# Patient Record
Sex: Female | Born: 1955
Health system: Southern US, Community
[De-identification: ages and names within clinical notes are randomized; demographics above are authoritative.]

## PROBLEM LIST (undated history)

## (undated) DIAGNOSIS — Z8619 Personal history of other infectious and parasitic diseases: Secondary | ICD-10-CM

## (undated) DIAGNOSIS — E785 Hyperlipidemia, unspecified: Secondary | ICD-10-CM

## (undated) DIAGNOSIS — Z860100 Personal history of colon polyps, unspecified: Secondary | ICD-10-CM

## (undated) DIAGNOSIS — K5790 Diverticulosis of intestine, part unspecified, without perforation or abscess without bleeding: Secondary | ICD-10-CM

## (undated) DIAGNOSIS — R011 Cardiac murmur, unspecified: Secondary | ICD-10-CM

## (undated) DIAGNOSIS — E559 Vitamin D deficiency, unspecified: Secondary | ICD-10-CM

## (undated) DIAGNOSIS — I2510 Atherosclerotic heart disease of native coronary artery without angina pectoris: Secondary | ICD-10-CM

## (undated) DIAGNOSIS — C439 Malignant melanoma of skin, unspecified: Secondary | ICD-10-CM

## (undated) DIAGNOSIS — F419 Anxiety disorder, unspecified: Secondary | ICD-10-CM

## (undated) HISTORY — DX: Personal history of colonic polyps: Z86.010

## (undated) HISTORY — DX: Malignant melanoma of skin, unspecified: C43.9

## (undated) HISTORY — DX: Vitamin D deficiency, unspecified: E55.9

## (undated) HISTORY — DX: Diverticulosis of intestine, part unspecified, without perforation or abscess without bleeding: K57.90

## (undated) HISTORY — DX: Hyperlipidemia, unspecified: E78.5

## (undated) HISTORY — DX: Cardiac murmur, unspecified: R01.1

## (undated) HISTORY — DX: Personal history of other infectious and parasitic diseases: Z86.19

## (undated) HISTORY — DX: Anxiety disorder, unspecified: F41.9

## (undated) HISTORY — DX: Personal history of colon polyps, unspecified: Z86.0100

---

## 1985-04-09 HISTORY — PX: MANDIBLE SURGERY: SHX707

## 1986-04-09 DIAGNOSIS — C439 Malignant melanoma of skin, unspecified: Secondary | ICD-10-CM

## 1986-04-09 HISTORY — PX: SKIN SURGERY: SHX2413

## 1986-04-09 HISTORY — DX: Malignant melanoma of skin, unspecified: C43.9

## 1996-04-09 HISTORY — PX: TUBAL LIGATION: SHX77

## 1997-04-09 HISTORY — PX: DILATION AND CURETTAGE OF UTERUS: SHX78

## 1997-12-29 ENCOUNTER — Ambulatory Visit (HOSPITAL_COMMUNITY): Admission: AD | Admit: 1997-12-29 | Discharge: 1997-12-29 | Payer: Self-pay | Admitting: Family Medicine

## 1998-06-20 ENCOUNTER — Ambulatory Visit (HOSPITAL_COMMUNITY): Admission: RE | Admit: 1998-06-20 | Discharge: 1998-06-20 | Payer: Self-pay | Admitting: Family Medicine

## 1998-06-20 ENCOUNTER — Encounter: Payer: Self-pay | Admitting: Family Medicine

## 1998-06-27 ENCOUNTER — Ambulatory Visit (HOSPITAL_COMMUNITY): Admission: RE | Admit: 1998-06-27 | Discharge: 1998-06-27 | Payer: Self-pay | Admitting: Family Medicine

## 1998-06-27 ENCOUNTER — Encounter: Payer: Self-pay | Admitting: Family Medicine

## 1998-12-22 ENCOUNTER — Encounter (INDEPENDENT_AMBULATORY_CARE_PROVIDER_SITE_OTHER): Payer: Self-pay

## 1998-12-22 ENCOUNTER — Ambulatory Visit (HOSPITAL_COMMUNITY): Admission: RE | Admit: 1998-12-22 | Discharge: 1998-12-22 | Payer: Self-pay | Admitting: *Deleted

## 2000-11-22 ENCOUNTER — Encounter (INDEPENDENT_AMBULATORY_CARE_PROVIDER_SITE_OTHER): Payer: Self-pay | Admitting: Specialist

## 2000-11-22 ENCOUNTER — Other Ambulatory Visit: Admission: RE | Admit: 2000-11-22 | Discharge: 2000-11-22 | Payer: Self-pay | Admitting: *Deleted

## 2002-02-10 ENCOUNTER — Other Ambulatory Visit: Admission: RE | Admit: 2002-02-10 | Discharge: 2002-02-10 | Payer: Self-pay | Admitting: *Deleted

## 2003-07-05 ENCOUNTER — Other Ambulatory Visit: Admission: RE | Admit: 2003-07-05 | Discharge: 2003-07-05 | Payer: Self-pay | Admitting: *Deleted

## 2004-07-12 ENCOUNTER — Other Ambulatory Visit: Admission: RE | Admit: 2004-07-12 | Discharge: 2004-07-12 | Payer: Self-pay | Admitting: *Deleted

## 2005-05-09 ENCOUNTER — Encounter: Admission: RE | Admit: 2005-05-09 | Discharge: 2005-05-09 | Payer: Self-pay | Admitting: Family Medicine

## 2005-07-24 ENCOUNTER — Other Ambulatory Visit: Admission: RE | Admit: 2005-07-24 | Discharge: 2005-07-24 | Payer: Self-pay | Admitting: *Deleted

## 2006-04-09 HISTORY — PX: COLONOSCOPY: SHX174

## 2006-07-25 ENCOUNTER — Other Ambulatory Visit: Admission: RE | Admit: 2006-07-25 | Discharge: 2006-07-25 | Payer: Self-pay | Admitting: *Deleted

## 2006-09-25 ENCOUNTER — Other Ambulatory Visit: Admission: RE | Admit: 2006-09-25 | Discharge: 2006-09-25 | Payer: Self-pay | Admitting: *Deleted

## 2011-08-09 LAB — COMPREHENSIVE METABOLIC PANEL
AST: 17 U/L
Alkaline Phosphatase: 64 U/L
BUN: 15 mg/dL (ref 4–21)
Creat: 0.72
Potassium: 4.5 mmol/L

## 2011-11-21 ENCOUNTER — Ambulatory Visit (INDEPENDENT_AMBULATORY_CARE_PROVIDER_SITE_OTHER): Payer: 59 | Admitting: Family Medicine

## 2011-11-21 ENCOUNTER — Encounter: Payer: Self-pay | Admitting: Family Medicine

## 2011-11-21 VITALS — BP 128/78 | HR 64 | Temp 97.9°F | Ht 65.0 in | Wt 157.8 lb

## 2011-11-21 DIAGNOSIS — M25522 Pain in left elbow: Secondary | ICD-10-CM | POA: Insufficient documentation

## 2011-11-21 DIAGNOSIS — M25562 Pain in left knee: Secondary | ICD-10-CM | POA: Insufficient documentation

## 2011-11-21 DIAGNOSIS — M25529 Pain in unspecified elbow: Secondary | ICD-10-CM

## 2011-11-21 DIAGNOSIS — G4700 Insomnia, unspecified: Secondary | ICD-10-CM

## 2011-11-21 DIAGNOSIS — M25569 Pain in unspecified knee: Secondary | ICD-10-CM

## 2011-11-21 DIAGNOSIS — F5104 Psychophysiologic insomnia: Secondary | ICD-10-CM | POA: Insufficient documentation

## 2011-11-21 NOTE — Assessment & Plan Note (Signed)
Discussed sleep hygiene. Try measures discussed. If not improved ,consider silenor. myself and pt hesitant to use non benzo hypnotic long term.

## 2011-11-21 NOTE — Assessment & Plan Note (Signed)
Anticipate medial collateral ligament strain - treat with OTC NSAIDs, ice, rest, knee brace.   Provided with SM pt advisor handout on medial ligament knee strain. If not better, consider further eval.

## 2011-11-21 NOTE — Patient Instructions (Addendum)
Good to see you today.  Return as needed or in 6 mo to 1 year for physical/rpt blood work. I think you have medial ligament strain of left knee - try stretching exercises and flexible knee brace, let me know if not improving. Keep eye on elbow. We may try silenor in future for sleep.  Insomnia Insomnia is frequent trouble falling and/or staying asleep. Insomnia can be a long term problem or a short term problem. Both are common. Insomnia can be a short term problem when the wakefulness is related to a certain stress or worry. Long term insomnia is often related to ongoing stress during waking hours and/or poor sleeping habits. Overtime, sleep deprivation itself can make the problem worse. Every little thing feels more severe because you are overtired and your ability to cope is decreased. CAUSES   Stress, anxiety, and depression.   Poor sleeping habits.   Distractions such as TV in the bedroom.   Naps close to bedtime.   Engaging in emotionally charged conversations before bed.   Technical reading before sleep.   Alcohol and other sedatives. They may make the problem worse. They can hurt normal sleep patterns and normal dream activity.   Stimulants such as caffeine for several hours prior to bedtime.   Pain syndromes and shortness of breath can cause insomnia.   Exercise late at night.   Changing time zones may cause sleeping problems (jet lag).  It is sometimes helpful to have someone observe your sleeping patterns. They should look for periods of not breathing during the night (sleep apnea). They should also look to see how long those periods last. If you live alone or observers are uncertain, you can also be observed at a sleep clinic where your sleep patterns will be professionally monitored. Sleep apnea requires a checkup and treatment. Give your caregivers your medical history. Give your caregivers observations your family has made about your sleep.  SYMPTOMS   Not feeling  rested in the morning.   Anxiety and restlessness at bedtime.   Difficulty falling and staying asleep.  TREATMENT   Your caregiver may prescribe treatment for an underlying medical disorders. Your caregiver can give advice or help if you are using alcohol or other drugs for self-medication. Treatment of underlying problems will usually eliminate insomnia problems.   Medications can be prescribed for short time use. They are generally not recommended for lengthy use.   Over-the-counter sleep medicines are not recommended for lengthy use. They can be habit forming.   You can promote easier sleeping by making lifestyle changes such as:   Using relaxation techniques that help with breathing and reduce muscle tension.   Exercising earlier in the day.   Changing your diet and the time of your last meal. No night time snacks.   Establish a regular time to go to bed.   Counseling can help with stressful problems and worry.   Soothing music and white noise may be helpful if there are background noises you cannot remove.   Stop tedious detailed work at least one hour before bedtime.  HOME CARE INSTRUCTIONS   Keep a diary. Inform your caregiver about your progress. This includes any medication side effects. See your caregiver regularly. Take note of:   Times when you are asleep.   Times when you are awake during the night.   The quality of your sleep.   How you feel the next day.  This information will help your caregiver care for you.  Get  out of bed if you are still awake after 15 minutes. Read or do some quiet activity. Keep the lights down. Wait until you feel sleepy and go back to bed.   Keep regular sleeping and waking hours. Avoid naps.   Exercise regularly.   Avoid distractions at bedtime. Distractions include watching television or engaging in any intense or detailed activity like attempting to balance the household checkbook.   Develop a bedtime ritual. Keep a familiar  routine of bathing, brushing your teeth, climbing into bed at the same time each night, listening to soothing music. Routines increase the success of falling to sleep faster.   Use relaxation techniques. This can be using breathing and muscle tension release routines. It can also include visualizing peaceful scenes. You can also help control troubling or intruding thoughts by keeping your mind occupied with boring or repetitive thoughts like the old concept of counting sheep. You can make it more creative like imagining planting one beautiful flower after another in your backyard garden.   During your day, work to eliminate stress. When this is not possible use some of the previous suggestions to help reduce the anxiety that accompanies stressful situations.  MAKE SURE YOU:   Understand these instructions.   Will watch your condition.   Will get help right away if you are not doing well or get worse.  Document Released: 03/23/2000 Document Revised: 03/15/2011 Document Reviewed: 04/23/2007 [facility] Patient Information 2012 [place], [place].

## 2011-11-21 NOTE — Assessment & Plan Note (Signed)
Benign exam.  Monitor for now, may have already resolved.

## 2011-11-21 NOTE — Progress Notes (Signed)
Subjective:    Patient ID: [name], female    DOB: 10-18-1955, 56 y.o.   MRN: [id number]  HPI CC: new pt to establish  Pt brings records from prior PCP, Dr. Nita Sells.  L knee pain - started after memorial day this year.  Anterior medial inferior knee pain, sharp stabbing.  Worse pain with squatting down or with stepping out of car.  No pain with stairs or hills. L elbow pain - since April.  Worse pain with lifting something heavy.  Posterior distal elbow pain.  No inciting injury, trauma.  No new exercise routine to start this.  Tries to walk a few miles 2-3x/wk which seems to help pain.  Ibuprofen helps.  Insomnia - sleep maintenance.  Has tried 1/4 pill xanax 0.5, melatonin, 5HTP, ambien (which did help but pt worried about long term use.  No trouble falling asleep - trouble staying asleep, will sleep for 2 hours then stays awake for several hours at a time.  LMP 09/2011, less regular since beginning of 2013.  Preventative: Last CPE 10/2010 (Dr. Nita Sells).   Well woman with OBGYN - March 2013. Tetanus - unsure colonoscopy - 2007, normal, rec rpt 10 yrs (Dr. Loreta Ave)  Caffeine: 2 cups coffee/day Lives with husband, 2 dogs. 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  Past histories reviewed and updated if relevant as below. There is no problem list on file for this patient.  Past Medical History  Diagnosis Date  . Malignant melanoma 1988    L thigh, stage 3, yearly skin checks with Dr .[place] at [facility]  . History of chicken pox   . Heart murmur     faint, ?MVP   Past Surgical History  Procedure Date  . Mandible surgery 1987  . Cesarean section 1982 and 1989  . Tubal ligation 1998  . Skin surgery 1988    melanoma excision   History  Substance Use Topics  . Smoking status: Former Smoker    Quit date: 04/09/1992  . Smokeless tobacco: Never  Used   Comment: quit 20 years ago  . Alcohol Use: Yes     4 glasses of wine a week   Family History  Problem Relation Age of Onset  . Coronary artery disease Father 89  . Other Mother 85    pulm aneurysm  . Stroke Maternal Grandmother   . Cancer Neg Hx   . Diabetes Neg Hx   . Hypertension Sister   . Coronary artery disease Brother 1    s/p CABG   No Known Allergies No current outpatient prescriptions on file prior to visit.     Review of Systems  Constitutional: Negative for fever, chills, activity change, appetite change, fatigue and unexpected weight change.  HENT: Negative for hearing loss and neck pain.   Eyes: Negative for visual disturbance.  Respiratory: Positive for shortness of breath (just with exercise). Negative for cough, chest tightness and wheezing.   Cardiovascular: Negative for chest pain, palpitations and leg swelling.  Gastrointestinal: Negative for nausea, vomiting, abdominal pain, diarrhea, constipation, blood in stool and abdominal distention.  Genitourinary: Negative for hematuria and difficulty urinating.  Musculoskeletal: Negative for myalgias and arthralgias.  Skin: Negative for rash.  Neurological: Negative for dizziness, seizures, syncope and headaches.  Hematological: Does not bruise/bleed easily.  Psychiatric/Behavioral: Negative for dysphoric mood. The patient is nervous/anxious.        Objective:  Physical Exam  Nursing note and vitals reviewed. Constitutional: She is oriented to person, place, and time. She appears well-developed and well-nourished. No distress.  HENT:  Head: Normocephalic and atraumatic.  Right Ear: External ear normal.  Left Ear: External ear normal.  Nose: Nose normal.  Mouth/Throat: Oropharynx is clear and moist. No oropharyngeal exudate.  Eyes: Conjunctivae and EOM are normal. Pupils are equal, round, and reactive to light. No scleral icterus.  Neck: Normal range of motion. Neck supple. No thyromegaly present.    Cardiovascular: Normal rate, regular rhythm, normal heart sounds and intact distal pulses.   No murmur heard. Pulses:      Radial pulses are 2+ on the right side, and 2+ on the left side.  Pulmonary/Chest: Effort normal and breath sounds normal. No respiratory distress. She has no wheezes. She has no rales.  Abdominal: Soft. Bowel sounds are normal. She exhibits no distension and no mass. There is no tenderness. There is no rebound and no guarding.  Musculoskeletal: Normal range of motion. She exhibits no edema.       R elbow WNL L elbow - FROM flexion and extension, no pain with palpation of joint - no pain at med/lat epicondyle, olecranon. R knee WNL L knee -  No deformity, no tenderness to palpation.  FROM.  Mild crepitus.  Neg drawer test, neg McMurray's test, no PF grind, no abnl patellar mobility.  + tender with testing with valgus force.  Lymphadenopathy:    She has no cervical adenopathy.  Neurological: She is alert and oriented to person, place, and time.       CN grossly intact, station and gait intact  Skin: Skin is warm and dry. No rash noted.  Psychiatric: She has a normal mood and affect. Her behavior is normal. Judgment and thought content normal.       Assessment & Plan:

## 2011-11-21 NOTE — Addendum Note (Signed)
Addended by: Eustaquio Boyden on: 11/21/2011 11:33 PM   Modules accepted: Level of Service

## 2011-11-25 ENCOUNTER — Encounter: Payer: Self-pay | Admitting: Family Medicine

## 2011-12-12 ENCOUNTER — Other Ambulatory Visit: Payer: Self-pay | Admitting: Dermatology

## 2011-12-17 ENCOUNTER — Encounter: Payer: Self-pay | Admitting: Family Medicine

## 2012-05-13 ENCOUNTER — Encounter: Payer: Self-pay | Admitting: Obstetrics and Gynecology

## 2012-05-14 ENCOUNTER — Encounter: Payer: Self-pay | Admitting: Obstetrics and Gynecology

## 2012-06-27 ENCOUNTER — Ambulatory Visit (INDEPENDENT_AMBULATORY_CARE_PROVIDER_SITE_OTHER): Payer: 59 | Admitting: Obstetrics and Gynecology

## 2012-06-27 ENCOUNTER — Encounter: Payer: Self-pay | Admitting: Obstetrics and Gynecology

## 2012-06-27 VITALS — BP 124/70 | Ht 64.75 in | Wt 148.0 lb

## 2012-06-27 DIAGNOSIS — Z0000 Encounter for general adult medical examination without abnormal findings: Secondary | ICD-10-CM

## 2012-06-27 LAB — POCT URINALYSIS DIPSTICK
Bilirubin, UA: NEGATIVE
Glucose, UA: NEGATIVE
Ketones, UA: NEGATIVE
Leukocytes, UA: NEGATIVE
Protein, UA: NEGATIVE

## 2012-06-27 LAB — HEMOGLOBIN, FINGERSTICK: Hemoglobin, fingerstick: 14.7 g/dL (ref 12.0–16.0)

## 2012-06-27 MED ORDER — ESTRADIOL ACETATE 0.1 MG/24HR VA RING: 1.0000 | VAGINAL_RING | VAGINAL | Status: DC

## 2012-06-27 MED ORDER — PROGESTERONE MICRONIZED 100 MG PO CAPS: 100.0000 mg | ORAL_CAPSULE | Freq: Every day | ORAL | Status: DC

## 2012-06-27 NOTE — Addendum Note (Signed)
Addended by: Alison Murray on: 06/27/2012 12:19 PM   Modules accepted: Orders

## 2012-06-27 NOTE — Patient Instructions (Signed)

## 2012-06-27 NOTE — Progress Notes (Addendum)
57 y.o. MarriedCaucasian female   [name] here for annual exam. The hormones are "a miracle"  Sleeping better, no hot flashes, likes HRT very much.  Wants to continue.  No bleeding on HRT.   Patient's last menstrual period was 12/15/2011.          Sexually active: yes  The current method of family planning is none.    Exercising: yes  Home exercise routine includes stretching. Last mammogram:  12/04/2011 normal Last pap smear: 05/09/2009 neg History of abnormal pap:  Smoking:no Alcohol: occ wine Last colonoscopy:2009 (1) polyp benign Last Bone Density:  none  Hgb:      14.7          Urine:neg    Health Maintenance  Topic Date Due  . Influenza Vaccine  12/08/1956  . Pap Smear  11/06/1974  . Tetanus/tdap  11/06/1975  . Mammogram  11/06/2006  . Colonoscopy  09/27/2016    Family History  Problem Relation Age of Onset  . Coronary artery disease Father 14  . Other Mother 39    pulm aneurysm  . Stroke Maternal Grandmother   . Cancer Neg Hx   . Diabetes Neg Hx   . Hypertension Sister   . Coronary artery disease Brother 37    s/p CABG    Patient Active Problem List  Diagnosis  . Elbow pain, left  . Knee pain, left  . Insomnia    Past Medical History  Diagnosis Date  . Malignant melanoma 1988    L thigh, stage 3, yearly skin checks with Dr .[place] at [facility]  . History of chicken pox   . Heart murmur     PVCs thought from MVP  . Anxiety   . History of colon polyps   . Diverticulosis     by colonosocpy  . Vitamin D deficiency   . HLD (hyperlipidemia)     mild    Past Surgical History  Procedure Laterality Date  . Mandible surgery  1987  . Cesarean section  1982 and 1989  . Tubal ligation  1998  . Skin surgery  1988    melanoma excision  . Colonoscopy  2008    small hyperplastic polyps, small int hem, early diverticula (Mann) rpt 10 yrs  . Dilation and curettage of uterus  1999    Allergies: Review of patient's allergies indicates no known  allergies.  Current Outpatient Prescriptions  Medication Sig Dispense Refill  . Ascorbic Acid (VITAMIN C) 1000 MG tablet Take 1,000 mg by mouth daily.      . ergocalciferol (VITAMIN D2) 50000 UNITS capsule Take 50,000 Units by mouth every 14 (fourteen) days.       . Multiple Vitamin (MULTIVITAMIN) tablet Take 1 tablet by mouth daily.      Marland Kitchen ALPRAZolam (XANAX) 0.25 MG tablet Take 0.25 mg by mouth at bedtime as needed.       No current facility-administered medications for this visit.    ROS: Pertinent items are noted in HPI.  Exam:    BP 124/70  Ht 5' 4.75" (1.645 m)  Wt 148 lb (67.132 kg)  BMI 24.81 kg/m2  LMP 12/15/2011   Wt Readings from Last 3 Encounters:  06/27/12 148 lb (67.132 kg)  11/21/11 157 lb 12 oz (71.555 kg)     Ht Readings from Last 3 Encounters:  06/27/12 5' 4.75" (1.645 m)  11/21/11 5\' 5"  (1.651 m)    General appearance: alert, cooperative and appears stated age Head: Normocephalic, without obvious  abnormality, atraumatic Neck: no adenopathy, supple, symmetrical, trachea midline and thyroid not enlarged, symmetric, no tenderness/mass/nodules Lungs: clear to auscultation bilaterally Breasts: Inspection negative, No nipple retraction or dimpling, No nipple discharge or bleeding, No axillary or supraclavicular adenopathy, Normal to palpation without dominant masses Heart: regular rate and rhythm Abdomen: soft, non-tender; bowel sounds normal; no masses,  no organomegaly Extremities: extremities normal, atraumatic, no cyanosis or edema Skin: Skin color, texture, turgor normal. No rashes or lesions Lymph nodes: Cervical, supraclavicular, and axillary nodes normal. No abnormal inguinal nodes palpated Neurologic: Grossly normal   Pelvic: External genitalia:  no lesions              Urethra:  normal appearing urethra with no masses, tenderness or lesions              Bartholins and Skenes: normal                 Vagina: normal appearing vagina with normal color  and discharge, no lesions              Cervix: normal appearance              Pap taken: yes        Bimanual Exam:  Uterus:  uterus is normal size, shape, consistency and nontender                                      Adnexa: normal adnexa in size, nontender and no masses                                      Rectovaginal: Confirms                                      Anus:  normal sphincter tone, no lesions  A: well woman     P: mammogram pap smear counseled on breast self exam, mammography screening, menopause, adequate intake of calcium and vitamin D, diet and exercise return annually or prn     An After Visit Summary was printed and given to the patient.   This exam done by CPRomine, MD

## 2012-07-11 ENCOUNTER — Encounter: Payer: Self-pay | Admitting: Obstetrics and Gynecology

## 2012-08-19 ENCOUNTER — Other Ambulatory Visit: Payer: Self-pay | Admitting: Obstetrics and Gynecology

## 2012-09-27 ENCOUNTER — Other Ambulatory Visit: Payer: Self-pay | Admitting: Obstetrics and Gynecology

## 2012-10-21 ENCOUNTER — Telehealth: Payer: Self-pay | Admitting: Obstetrics and Gynecology

## 2012-10-21 MED ORDER — ESTRADIOL ACETATE 0.1 MG/24HR VA RING: 1.0000 | VAGINAL_RING | VAGINAL | Status: DC

## 2012-10-21 NOTE — Telephone Encounter (Signed)
Pt needs her prescription faxed to her mail order for [facility] [phone]

## 2012-10-21 NOTE — Telephone Encounter (Signed)
Original rx was sent to [name]  Pharmacy Femring #1/3rfs [facility], Tried to call pt was not available, LM on pt's voicemail that this was done.

## 2012-10-28 ENCOUNTER — Other Ambulatory Visit: Payer: Self-pay | Admitting: Obstetrics and Gynecology

## 2012-10-28 ENCOUNTER — Other Ambulatory Visit: Payer: Self-pay | Admitting: Family Medicine

## 2012-10-28 DIAGNOSIS — E559 Vitamin D deficiency, unspecified: Secondary | ICD-10-CM

## 2012-10-28 DIAGNOSIS — E785 Hyperlipidemia, unspecified: Secondary | ICD-10-CM

## 2012-10-28 NOTE — Telephone Encounter (Signed)
Please advise given new Vitamin D protocol   AEX 06/27/12 no rx for Vit D was given; no vit d level was checked. Last Vit D level checked: 08/01/09- 53 Last Refill: 06/17/12 #90/0rf Aex Scheduled: 07/15/13

## 2012-10-29 ENCOUNTER — Other Ambulatory Visit (INDEPENDENT_AMBULATORY_CARE_PROVIDER_SITE_OTHER): Payer: 59

## 2012-10-29 ENCOUNTER — Other Ambulatory Visit: Payer: Self-pay | Admitting: Obstetrics and Gynecology

## 2012-10-29 DIAGNOSIS — E559 Vitamin D deficiency, unspecified: Secondary | ICD-10-CM

## 2012-10-29 DIAGNOSIS — E785 Hyperlipidemia, unspecified: Secondary | ICD-10-CM

## 2012-10-29 NOTE — Telephone Encounter (Signed)
eScribe request for refill on PROMETRIUM Last filled - 06/27/12 X 1 YEAR to local pharmacy.  RX was sent to mail order pharamacy on 08/20/12 without refills. Last AEX - 06/27/12 Next AEX - 07/15/13 Refills sent to mail order pharmacy to last until AEX in 07/2013.

## 2012-10-29 NOTE — Addendum Note (Signed)
Addended by: Alvina Chou on: 10/29/2012 10:47 AM   Modules accepted: Orders

## 2012-10-30 LAB — BASIC METABOLIC PANEL
BUN/Creatinine Ratio: 25 — ABNORMAL HIGH (ref 9–23)
GFR calc Af Amer: 114 mL/min/{1.73_m2} (ref >59)
GFR calc non Af Amer: 99 mL/min/{1.73_m2} (ref >59)
Potassium: 4 mmol/L (ref 3.5–5.2)
Sodium: 139 mmol/L (ref 134–144)

## 2012-10-31 LAB — BASIC METABOLIC PANEL

## 2012-10-31 LAB — VITAMIN D 25 HYDROXY (VIT D DEFICIENCY, FRACTURES): Vit D, 25-Hydroxy: 31.5 ng/mL (ref 30.0–100.0)

## 2012-10-31 LAB — LIPID PANEL
Cholesterol, Total: 233 mg/dL — ABNORMAL HIGH (ref 100–199)
HDL: 89 mg/dL (ref >39)
VLDL Cholesterol Cal: 12 mg/dL (ref 5–40)

## 2012-11-01 LAB — NMR, LIPOPROFILE
Cholesterol: 220 mg/dL — ABNORMAL HIGH (ref <200)
HDL Cholesterol by NMR: 83 mg/dL (ref >=40)
HDL Particle Number: 37.1 umol/L (ref >=30.5)
LDLC SERPL CALC-MCNC: 123 mg/dL — ABNORMAL HIGH (ref <100)

## 2012-11-01 LAB — SPECIMEN STATUS REPORT

## 2012-11-03 ENCOUNTER — Encounter: Payer: Self-pay | Admitting: *Deleted

## 2012-11-03 ENCOUNTER — Encounter: Payer: 59 | Admitting: Family Medicine

## 2012-11-26 ENCOUNTER — Other Ambulatory Visit: Payer: 59

## 2012-12-02 ENCOUNTER — Encounter: Payer: 59 | Admitting: Family Medicine

## 2013-01-14 ENCOUNTER — Ambulatory Visit (INDEPENDENT_AMBULATORY_CARE_PROVIDER_SITE_OTHER): Payer: 59 | Admitting: Family Medicine

## 2013-01-14 ENCOUNTER — Encounter: Payer: Self-pay | Admitting: Family Medicine

## 2013-01-14 VITALS — BP 136/84 | HR 64 | Temp 98.1°F | Ht 65.0 in | Wt 147.0 lb

## 2013-01-14 DIAGNOSIS — E785 Hyperlipidemia, unspecified: Secondary | ICD-10-CM

## 2013-01-14 DIAGNOSIS — R0600 Dyspnea, unspecified: Secondary | ICD-10-CM

## 2013-01-14 DIAGNOSIS — Z23 Encounter for immunization: Secondary | ICD-10-CM

## 2013-01-14 DIAGNOSIS — Z0000 Encounter for general adult medical examination without abnormal findings: Secondary | ICD-10-CM | POA: Insufficient documentation

## 2013-01-14 DIAGNOSIS — Z0001 Encounter for general adult medical examination with abnormal findings: Secondary | ICD-10-CM | POA: Insufficient documentation

## 2013-01-14 DIAGNOSIS — Z8249 Family history of ischemic heart disease and other diseases of the circulatory system: Secondary | ICD-10-CM | POA: Insufficient documentation

## 2013-01-14 MED ORDER — ATORVASTATIN CALCIUM 10 MG PO TABS: 10.0000 mg | ORAL_TABLET | Freq: Every day | ORAL | Status: DC

## 2013-01-14 NOTE — Assessment & Plan Note (Addendum)
Endorses longstanding dyspnea with exertion and some at rest - out of norm for that expected. Doubt pulmonary etiology. Will refer to cards for further eval in setting of premature CAD hx (brother with CABG age 56)

## 2013-01-14 NOTE — Assessment & Plan Note (Addendum)
Reviewed recent FLP as well as LDL particle size In fmhx CAD, ideally would recommend LDL <100. Start lipitor 10mg  daily - discussed possible myalgia effect. Will mail low chol diet handout to patient.

## 2013-01-14 NOTE — Progress Notes (Signed)
Subjective:    Patient ID: [name], female    DOB: January 12, 1956, 57 y.o.   MRN: [id number]  HPI CC: CPE  Strong fmhx CAD - father age 1, brother age 72, sister with CHF age 22 deceased. Occasional dyspnea at rest and with exertion.  No chest pain/tightness with this.  Told has MVP by prior PCP.  No recent smoking, no h/o asthma. Has seen Dr. Patty Sermons 10 yrs ago.  Preventative:  Well woman with OBGYN Dr. Conley Simmonds - March 2014.  On low dose estrogen for hot flashes. Colonoscopy - 2008, normal, rec rpt 10 yrs (Dr. Loreta Ave). Flu shot today. Tetanus - 2012  Caffeine: 2 cups coffee/day  Lives with husband, 2 dogs.  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  Past histories reviewed and updated if relevant as below. Patient Active Problem List   Diagnosis Date Noted  . Vitamin D deficiency   . HLD (hyperlipidemia)   . Elbow pain, left 11/21/2011  . Knee pain, left 11/21/2011  . Insomnia 11/21/2011   Past Medical History  Diagnosis Date  . Malignant melanoma 1988    L thigh, stage 3, yearly skin checks with Dr [facility] at [facility]  . History of chicken pox   . Heart murmur     PVCs thought from MVP  . Anxiety   . History of colon polyps   . Diverticulosis     by colonosocpy  . Vitamin D deficiency   . HLD (hyperlipidemia)     mild   Past Surgical History  Procedure Laterality Date  . Mandible surgery  1987  . Cesarean section  1982 and 1989  . Tubal ligation  1998  . Skin surgery  1988    melanoma excision  . Colonoscopy  2008    small hyperplastic polyps, small int hem, early diverticula ([facility]) rpt 10 yrs  . Dilation and curettage of uterus  1999   History  Substance Use Topics  . Smoking status: Former Smoker    Quit date: 04/09/1992  . Smokeless tobacco: Never Used     Comment: quit 20  years ago  . Alcohol Use: 2.0 oz/week    4 drink(s) per week     Comment: 4 glasses of wine a week   Family History  Problem Relation Age of Onset  . Coronary artery disease Father 69  . Other Mother 37    pulm aneurysm  . Stroke Maternal Grandmother   . Cancer Neg Hx   . Diabetes Neg Hx   . Hypertension Sister   . Coronary artery disease Brother 55    s/p CABG   No Known Allergies Current Outpatient Prescriptions on File Prior to Visit  Medication Sig Dispense Refill  . Estradiol Acetate (FEMRING) 0.1 MG/24HR RING Place 1 each vaginally every 3 (three) months.  1 each  3  . progesterone (PROMETRIUM) 100 MG capsule TAKE 1 CAPSULE AT BEDTIME  90 capsule  1  . Vitamin D, Ergocalciferol, (DRISDOL) 50000 UNITS CAPS TAKE 1 CAPSULE EVERY OTHER WEEK  6 capsule  0  . Ascorbic Acid (VITAMIN C) 1000 MG tablet Take by mouth daily.        No current facility-administered medications on file prior to visit.     Review of Systems  Constitutional: Negative for fever, chills, activity change, appetite change, fatigue and  unexpected weight change.  HENT: Negative for hearing loss.   Eyes: Negative for visual disturbance.  Respiratory: Positive for shortness of breath (see hpi). Negative for cough, chest tightness and wheezing.   Cardiovascular: Negative for chest pain, palpitations and leg swelling.  Gastrointestinal: Negative for nausea, vomiting, abdominal pain, diarrhea, constipation, blood in stool and abdominal distention.  Genitourinary: Negative for hematuria and difficulty urinating.  Musculoskeletal: Negative for arthralgias, myalgias and neck pain.  Skin: Negative for rash.  Neurological: Negative for dizziness, seizures, syncope and headaches.  Hematological: Negative for adenopathy. Does not bruise/bleed easily.  Psychiatric/Behavioral: Negative for dysphoric mood. The patient is not nervous/anxious.        Objective:   Physical Exam  Nursing note and vitals  reviewed. Constitutional: She is oriented to person, place, and time. She appears well-developed and well-nourished. No distress.  HENT:  Head: Normocephalic and atraumatic.  Right Ear: External ear normal.  Left Ear: External ear normal.  Nose: Nose normal.  Mouth/Throat: Oropharynx is clear and moist. No oropharyngeal exudate.  Eyes: Conjunctivae and EOM are normal. Pupils are equal, round, and reactive to light. No scleral icterus.  Neck: Normal range of motion. Neck supple. No thyromegaly present.  Cardiovascular: Normal rate, regular rhythm, normal heart sounds and intact distal pulses.   No murmur heard. Pulses:      Radial pulses are 2+ on the right side, and 2+ on the left side.  No murmur appreciated  Pulmonary/Chest: Effort normal and breath sounds normal. No respiratory distress. She has no wheezes. She has no rales.  Abdominal: Soft. Bowel sounds are normal. She exhibits no distension and no mass. There is no tenderness. There is no rebound and no guarding.  Musculoskeletal: Normal range of motion. She exhibits no edema.  Lymphadenopathy:    She has no cervical adenopathy.  Neurological: She is alert and oriented to person, place, and time.  CN grossly intact, station and gait intact  Skin: Skin is warm and dry. No rash noted.  Psychiatric: She has a normal mood and affect. Her behavior is normal. Judgment and thought content normal.       Assessment & Plan:

## 2013-01-14 NOTE — Assessment & Plan Note (Signed)
Preventative protocols reviewed and updated unless pt declined. Discussed healthy diet and lifestyle. See below. Flu shot today. Well woman with OBGYN.

## 2013-01-14 NOTE — Patient Instructions (Addendum)
Flu shot today. Pass by Marion's office or we will call you with appointment. Start lipitor for cholesterol levels - one daily at night time 10mg  Return as needed or in 1 year for next physical

## 2013-01-23 ENCOUNTER — Telehealth: Payer: Self-pay | Admitting: Obstetrics and Gynecology

## 2013-01-23 NOTE — Telephone Encounter (Signed)
Dr. Edward Jolly,  This is a previous patient of Dr. Tresa Res. She was seen on 04/29/12 and started on Femring and prometrium. Has been doing well on rx until July, since then has had 1-2 days of spotting each month, barely enough to need panty liner per patient.  Previously had not had bleeding since 12/2011. She is due to change femring in a few days and since she is currently having spotting she was wondering if she should. Does she need office with you for eval? Is she okay to place in new fem ring until then?

## 2013-01-23 NOTE — Telephone Encounter (Signed)
Needs office evaluation.  OK to continue with Femring but I would like to see the patient in the next 1 - 2 weeks.  Thanks.

## 2013-01-23 NOTE — Telephone Encounter (Signed)
Spoke with patient. OV scheduled. Patient agreeable with plan.  Will close encounter.

## 2013-01-23 NOTE — Telephone Encounter (Signed)
Patient has been having spotting since July every week 1-2 days very light spotting not enough to wear a tampon or anything though. Prior to that she hadn't had any bleeding since September of last year. She was put on estrogen ring and progesterone in January. It was time for her to put in a new ring but didn't know what to do since she had been spotting.

## 2013-02-06 ENCOUNTER — Encounter: Payer: Self-pay | Admitting: Obstetrics and Gynecology

## 2013-02-06 ENCOUNTER — Ambulatory Visit (INDEPENDENT_AMBULATORY_CARE_PROVIDER_SITE_OTHER): Payer: 59 | Admitting: Obstetrics and Gynecology

## 2013-02-06 VITALS — BP 124/70 | HR 76 | Resp 18 | Wt 147.0 lb

## 2013-02-06 DIAGNOSIS — N950 Postmenopausal bleeding: Secondary | ICD-10-CM

## 2013-02-06 NOTE — Progress Notes (Signed)
GYNECOLOGY PROBLEM VISIT  PCP: Dr. Oren Section  Referring provider:   HPI: 57 y.o.   Married  Caucasian  female   G3P3 with Patient's last menstrual period was 12/15/2011.   here for   Occ vaginal Spotting. Started HRT in January 2014 due to hot flashes and night sweats. Now night sweats are minimal as well as hot flashes.  Uses Fem Ring and nightly Prometrium.  May have missed a dose of two of the Prometrium.   Spotting started in mid July 2014.  Light spotting - a day or two and then would recur every 1 - 3 weeks. This persisted in August and September. New ring placed in October and no spotting since.  No pain.  No bleeding with intercourse and no dyspareunia.   Had a D & C in 2005 for heavy bleeding.  Benign.   Took ASA 81 mg daily for a couple of months due to chest pains.   Was between jobs.  Seeing [facility] PCP.  Will see Cardiology as well.  FH of coronary artery disease - sister, brother, father.   GYNECOLOGIC HISTORY: Patient's last menstrual period was 12/15/2011. Sexually active:  yes Partner preference: female Contraception:   Tubal ligation Menopausal hormone therapy: yes DES exposure:   no Blood transfusions:   no Sexually transmitted diseases:   no GYN Procedures:  Not sure Mammogram:    12/04/11 neg             Pap:   05/09/09 neg History of abnormal pap smear:  no   OB History   Grav Para Term Preterm Abortions TAB SAB Ect Mult Living   3 3        3          Family History  Problem Relation Age of Onset  . Coronary artery disease Father 66    MI?  . Other Mother 62    pulm aneurysm  . Stroke Maternal Grandmother   . Hypertension Sister   . Coronary artery disease Brother 59    s/p CABG  . Heart disease Sister 55    CHF, deceased  . Cancer Neg Hx   . Diabetes Neg Hx     Patient Active Problem List   Diagnosis Date Noted  . Routine health maintenance 01/14/2013  . Family history of early CAD 01/14/2013  . Dyspnea 01/14/2013  . Vitamin D  deficiency   . HLD (hyperlipidemia)   . Insomnia 11/21/2011    Past Medical History  Diagnosis Date  . Malignant melanoma 1988    L thigh, stage 3, yearly skin checks with Dr .[place] at [facility]  . History of chicken pox   . Heart murmur     PVCs thought from MVP  . Anxiety   . History of colon polyps   . Diverticulosis     by colonosocpy  . Vitamin D deficiency   . HLD (hyperlipidemia)     mild    Past Surgical History  Procedure Laterality Date  . Mandible surgery  1987  . Cesarean section  1982 and 1989  . Tubal ligation  1998  . Skin surgery  1988    melanoma excision  . Colonoscopy  2008    small hyperplastic polyps, small int hem, early diverticula (Mann) rpt 10 yrs  . Dilation and curettage of uterus  1999    ALLERGIES: Review of patient's allergies indicates no known allergies.  Current Outpatient Prescriptions  Medication Sig Dispense Refill  . Ascorbic Acid (  VITAMIN C) 1000 MG tablet Take by mouth daily.       . Estradiol Acetate (FEMRING) 0.1 MG/24HR RING Place 1 each vaginally every 3 (three) months.  1 each  3  . NON FORMULARY Mitomax; Alpha CRS; Microplex VM; xEO Mega; vEO Mega (all Essential Oil therapy)      . progesterone (PROMETRIUM) 100 MG capsule TAKE 1 CAPSULE AT BEDTIME  90 capsule  1  . Vitamin D, Ergocalciferol, (DRISDOL) 50000 UNITS CAPS TAKE 1 CAPSULE EVERY OTHER WEEK  6 capsule  0  . atorvastatin (LIPITOR) 10 MG tablet Take 1 tablet (10 mg total) by mouth daily.  30 tablet  11   No current facility-administered medications for this visit.     ROS:  Pertinent items are noted in HPI.  SOCIAL HISTORY:  Works for [employer].  PHYSICAL EXAMINATION:    BP 124/70  Pulse 76  Resp 18  Wt 147 lb (66.679 kg)  BMI 24.46 kg/m2  LMP 12/15/2011   Wt Readings from Last 3 Encounters:  02/06/13 147 lb (66.679 kg)  01/14/13 147 lb (66.679 kg)  06/27/12 148 lb (67.132 kg)     Ht Readings from Last 3 Encounters:  01/14/13 5\' 5"  (1.651 m)  06/27/12  5' 4.75" (1.645 m)  11/21/11 5\' 5"  (1.651 m)    General appearance: alert, cooperative and appears stated age No abnormal inguinal nodes palpated Neurologic: Grossly normal  Pelvic: External genitalia:  no lesions              Urethra:  normal appearing urethra with no masses, tenderness or lesions              Bartholins and Skenes: normal                 Vagina: normal appearing vagina with normal color and discharge, no lesions              Cervix: normal appearance                      Bimanual Exam:  Uterus:  uterus is normal size, shape, consistency and nontender                                      Adnexa: normal adnexa in size, nontender and no masses                                      Rectovaginal: Confirms                                      Anus:  normal sphincter tone, no lesions  Procedure - Endometrial Biopsy. Consent verbal and written. Femring removed to perform procedure and bimanual exam.  Speculum placed in vagina. Sterile prep with betadine.  Tenaculum to anterior cervical lip. Attempt to pass Pipelle unsuccessful due to stenosis.  Paracervical block with 1% lidocaine, 10 cc total at 6 and 7 o'clock. Hegar dilator used to dilate the cervix. Pipelle then passed successfully twice. Tissue to pathology. No complications.  Tolerated well. EBL 10 cc from paracervical block injection site. Patient will replace Femring. Instructions and precautions given regarding EMB.    ASSESSMENT  Postmenopausal bleeding on HRT. Cervical stenosis.  Endometrial biopsy performed today.     PLAN  Return for pelvic ultrasound and possible sonohysterogram. Will follow up on endometrial biopsy. Counseling regarding hormone therapy and postmenopausal bleeding. An After Visit Summary was printed and given to the patient.  25 minutes spent face to fact time of which over 50% was counseling.

## 2013-02-06 NOTE — Patient Instructions (Signed)
Postmenopausal Bleeding Menopause is commonly referred to as the "change in life." It is a time when the fertile years, the time of ovulating and having menstrual periods, has come to an end. It is also determined by not having menstrual periods for 12 months.  Postmenopausal bleeding is any bleeding a woman has after she has entered into menopause. Any type of postmenopausal bleeding, even if it appears to be a typical menstrual period, is concerning. This should be evaluated by your caregiver.  CAUSES   Hormone therapy.  Cancer of the cervix or cancer of the lining of the uterus (endometrial cancer).  Thinning of the uterine lining (uterine atrophy).  Thyroid diseases.  Certain medicines.  Infection of the uterus or cervix.  Inflammation or irritation of the uterine lining (endometritis).  Estrogen-secreting tumors.  Growths (polyps) on the cervix, uterine lining, or uterus.  Uterine tumors (fibroids).  Being very overweight (obese). DIAGNOSIS  Your caregiver will take a medical history and ask questions. A physical exam will also be performed. Further tests may include:   A transvaginal ultrasound. An ultrasound wand or probe is inserted into your vagina to view the pelvic organs.  A biopsy of the lining of the uterus (endometrium). A sample of the endometrium is removed and examined.  A hysteroscopy. Your caregiver may use an instrument with a light and a camera attached to it (hysteroscope). The hysteroscope is used to look inside the uterus for problems.  A dilation and curettage (D&C). Tissue is removed from the uterine lining to be examined for problems. TREATMENT  Treatment depends on the cause of the bleeding. Some treatments include:   Surgery.  Medicines.  Hormones.  A hysteroscopy or D&C to remove polyps or fibroids.  Changing or stopping a current medicine you are taking. Talk to your caregiver about your specific treatment. HOME CARE INSTRUCTIONS    Maintain a healthy weight.  Keep regular pelvic exams and Pap tests. SEEK MEDICAL CARE IF:   You have bleeding, even if it is light in comparison to your previous periods.  Your bleeding lasts more than 1 week.  You have abdominal pain.  You develop bleeding with sexual intercourse. SEEK IMMEDIATE MEDICAL CARE IF:   You have a fever, chills, headache, dizziness, muscle aches, and bleeding.  You have severe pain with bleeding.  You are passing blood clots.  You have bleeding and need more than 1 pad an hour.  You feel faint. MAKE SURE YOU:  Understand these instructions.  Will watch your condition.  Will get help right away if you are not doing well or get worse. Document Released: 07/04/2005 Document Revised: 06/18/2011 Document Reviewed: 11/30/2010 [facility] Patient Information 2014 [place], [place].  Endometrial Biopsy This is a test in which a tissue sample (a biopsy) is taken from inside the uterus (womb). It is then looked at by a specialist under a microscope to see if the tissue is normal or abnormal. The endometrium is the lining of the uterus. This test helps determine where you are in your menstrual cycle and how hormone levels are affecting the lining of the uterus. Another use for this test is to diagnose endometrial cancer, tuberculosis, polyps, or inflammatory conditions and to evaluate uterine bleeding. PREPARATION FOR TEST No preparation or fasting is necessary. NORMAL FINDINGS No pathologic conditions. Presence of "secretory-type" endometrium 3 to 5 days before to normal menstruation. Ranges for normal findings may vary among different laboratories and hospitals. You should always check with your doctor after having lab  work or other tests done to discuss the meaning of your test results and whether your values are considered within normal limits. MEANING OF TEST  Your caregiver will go over the test results with you and discuss the importance and meaning  of your results, as well as treatment options and the need for additional tests if necessary. OBTAINING THE TEST RESULTS It is your responsibility to obtain your test results. Ask the lab or department performing the test when and how you will get your results. Document Released: 07/27/2004 Document Revised: 06/18/2011 Document Reviewed: 03/05/2008 [facility] Patient Information 2014 [place], [place].

## 2013-02-07 DIAGNOSIS — N950 Postmenopausal bleeding: Secondary | ICD-10-CM | POA: Insufficient documentation

## 2013-02-09 ENCOUNTER — Telehealth: Payer: Self-pay | Admitting: Obstetrics and Gynecology

## 2013-02-09 NOTE — Telephone Encounter (Signed)
Patient returned my call. We discussed her benefits and she wants to wait for her endo bx results. Once she has her results and she talks with Dr. Edward Jolly she will schedule if Dr. Edward Jolly feels it is still necessary.

## 2013-02-09 NOTE — Telephone Encounter (Signed)
LMTCB to discuss ins benefits for [facility] and to schedule.

## 2013-02-10 LAB — IPS CERVICAL/ECC/EMB/VULVAR/VAGINAL BIOPSY

## 2013-02-10 NOTE — Telephone Encounter (Signed)
I left a message on cell phone that I called to discuss results and would like for the patient to complete her evaluation with the testing I recommended - pelvic ultrasound and sonohysterogram.  The biopsy showed a possible endometrial polyp.  I asked the patient to call the triage nurse as I am out of the office this afternoon and wanted to relay information to her.

## 2013-02-11 ENCOUNTER — Encounter: Payer: Self-pay | Admitting: Cardiology

## 2013-02-11 ENCOUNTER — Telehealth: Payer: Self-pay | Admitting: Obstetrics and Gynecology

## 2013-02-11 ENCOUNTER — Ambulatory Visit (INDEPENDENT_AMBULATORY_CARE_PROVIDER_SITE_OTHER): Payer: 59 | Admitting: Cardiology

## 2013-02-11 VITALS — BP 150/90 | HR 54 | Ht 65.5 in | Wt 148.6 lb

## 2013-02-11 DIAGNOSIS — E785 Hyperlipidemia, unspecified: Secondary | ICD-10-CM

## 2013-02-11 DIAGNOSIS — R0609 Other forms of dyspnea: Secondary | ICD-10-CM

## 2013-02-11 DIAGNOSIS — Z8249 Family history of ischemic heart disease and other diseases of the circulatory system: Secondary | ICD-10-CM

## 2013-02-11 DIAGNOSIS — R0600 Dyspnea, unspecified: Secondary | ICD-10-CM

## 2013-02-11 DIAGNOSIS — R0989 Other specified symptoms and signs involving the circulatory and respiratory systems: Secondary | ICD-10-CM

## 2013-02-11 NOTE — Telephone Encounter (Signed)
Phone call with patient.  Patient is not able to do pelvic ultrasound and sonohysterogram until January for insurance high deductible reasons.   She will have a new insurance company in January.  Endometrial biopsy shows a possible benign endometrial polyp.   Patient has had bleeding on HRT.  Will go ahead and schedule the procedure for January after the new year and precert prior to procedure.  I will have our precert department schedule this with the patient.   I have educated the patient that she may need a dilation and curettage, and she would like this to be next year as well.

## 2013-02-11 NOTE — Telephone Encounter (Signed)
Patient returning Dr. Rica Records call re: results.

## 2013-02-11 NOTE — Patient Instructions (Signed)
With your family history, and the fact that he did get some exertional shortness of breath. I would like to do a baseline cardiac pulmonary stress test to estimate her baseline cardiovascular risk. This will allow [place] to determine whether or not we need to consider doing things like statins Lipitor or other medical therapy.  I will see back after the study is done.  Your physician wants you to follow-up in: 4-6 weeks.  HARDING,DAVID W

## 2013-02-13 ENCOUNTER — Encounter: Payer: Self-pay | Admitting: Cardiology

## 2013-02-13 NOTE — Assessment & Plan Note (Signed)
Overall, she seems to be a much healthier person than the picture she thinks of her brother and father. However it isn't still reasonable to do a detailed evaluation of her baseline cardiovascular risk, that will allow [place] to direct further therapy.

## 2013-02-13 NOTE — Assessment & Plan Note (Addendum)
Target LDL would be less than 100 for her risk factor. I agree with her primary physician. She has been started on statin, but was waiting for this evaluation to start. CPET testing will help strengthen an argument for more aggressive therapy.

## 2013-02-13 NOTE — Assessment & Plan Note (Addendum)
She does note that she's had more pronounced exertional dyspnea than she had in the past. She still is able to walk 2 miles a day, on most days. However, she notes that sometimes he has to stop and rest, and walk slower than normal. Usually she is on level ground to look at an optical incline, she does note more shortness of breath. She was told in the past this is due to her mitral valve prolapse. With this in mind, in light of her family history of premature coronary disease, I think it is reasonable to evaluate for possible cardiac etiology. I like to do a physiologic study as opposed to a more specific macrovascular ischemic evaluation.   Plan: Cardiopulmonary Exercise Test (CPET-MET-TEST)
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
## 2013-02-17 ENCOUNTER — Telehealth: Payer: Self-pay | Admitting: Obstetrics and Gynecology

## 2013-02-17 NOTE — Telephone Encounter (Signed)
LMTCB to schedule SHGM in January.

## 2013-02-19 NOTE — Telephone Encounter (Signed)
SHGM scheduled for 04/23/13

## 2013-03-26 ENCOUNTER — Encounter: Payer: Self-pay | Admitting: Cardiology

## 2013-04-09 HISTORY — PX: OTHER SURGICAL HISTORY: SHX169

## 2013-04-13 ENCOUNTER — Telehealth: Payer: Self-pay | Admitting: Obstetrics and Gynecology

## 2013-04-13 DIAGNOSIS — R0602 Shortness of breath: Secondary | ICD-10-CM

## 2013-04-13 DIAGNOSIS — Z8249 Family history of ischemic heart disease and other diseases of the circulatory system: Secondary | ICD-10-CM

## 2013-04-13 DIAGNOSIS — E785 Hyperlipidemia, unspecified: Secondary | ICD-10-CM

## 2013-04-13 DIAGNOSIS — R0600 Dyspnea, unspecified: Secondary | ICD-10-CM

## 2013-04-13 NOTE — Telephone Encounter (Signed)
Patient returned call. Patient was advised of insurance patient liability quote of $916.54//ssf

## 2013-04-13 NOTE — Telephone Encounter (Signed)
Patient notified of Dr Elza Rafter response. Patient agreeable.

## 2013-04-13 NOTE — Telephone Encounter (Signed)
Voicemail confirmed patient name//left message for patient to call back regarding benefits //ssf

## 2013-04-13 NOTE — Telephone Encounter (Signed)
Called patient for new insurance infor. Per patient, she now has [place], Id# [phone]

## 2013-04-13 NOTE — Telephone Encounter (Signed)
I will start with just the pelvic ultrasound. If everything looks normal, I will not proceed with the sonohysterogram.

## 2013-04-13 NOTE — Telephone Encounter (Signed)
Patient wants to let Dr Quincy Simmonds know that she has not had any further bleeding. She wants to be sure that PUS is still needed. Advised that any PMB requires full evaluation to rule out abnormal cells and therefore I feel like Dr Quincy Simmonds will want her to proceed with PUS and poss SHGM to assess uterine cavity.  Recommend she plan to keep appointment as scheduled but advised that I will review with Dr Quincy Simmonds and call her back if there is any change in her recommendation. PUS/SHGM is scheduled for 04-23-13.  Agree?

## 2013-04-17 ENCOUNTER — Encounter: Payer: Self-pay | Admitting: Cardiology

## 2013-04-20 ENCOUNTER — Encounter: Payer: Self-pay | Admitting: Family Medicine

## 2013-04-20 NOTE — Progress Notes (Signed)
Quick Note:  FINAL REPORT:  Excellent effort - HR up to 91% of predicted (154 bpm), above target Effort! Normal Peak O2-pulse & Anaerobic Threshold. Peak VO2: 22.5 (106%) - Excellent  HR-O2-Pulse (Stroke Volume) curve shows some "ischemic" response during last ~5 min of exercise.  Low Risk Study, but the HR-Stroke Vol curve suggest early stages of Myocardial Dysfunction - could be structural diastolic dysfunction vs. (more likely) microvascular ischemia.  This means that the test is Low Risk, but nor "totally normal" -- suggesting early stages of Coronary Artery Disease.  We need to start now with aggressive Risk Factor Modification including: Diabetes Control (if applicable), Blood Pressure control, Cholesterol Control, Smoking cessation (if applicable).  Leonie Man, MD  ______

## 2013-04-23 ENCOUNTER — Encounter: Payer: Self-pay | Admitting: Obstetrics and Gynecology

## 2013-04-23 ENCOUNTER — Ambulatory Visit (INDEPENDENT_AMBULATORY_CARE_PROVIDER_SITE_OTHER): Payer: Managed Care, Other (non HMO) | Admitting: Obstetrics and Gynecology

## 2013-04-23 ENCOUNTER — Other Ambulatory Visit: Payer: Self-pay | Admitting: Obstetrics and Gynecology

## 2013-04-23 ENCOUNTER — Ambulatory Visit (INDEPENDENT_AMBULATORY_CARE_PROVIDER_SITE_OTHER): Payer: Managed Care, Other (non HMO)

## 2013-04-23 VITALS — BP 130/68 | HR 74 | Resp 14 | Wt 150.0 lb

## 2013-04-23 DIAGNOSIS — N950 Postmenopausal bleeding: Secondary | ICD-10-CM

## 2013-04-23 DIAGNOSIS — N840 Polyp of corpus uteri: Secondary | ICD-10-CM

## 2013-04-23 MED ORDER — PROGESTERONE MICRONIZED 100 MG PO CAPS: 100.0000 mg | ORAL_CAPSULE | Freq: Every day | ORAL | Status: DC

## 2013-04-23 MED ORDER — ESTRADIOL ACETATE 0.1 MG/24HR VA RING: 1.0000 | VAGINAL_RING | VAGINAL | Status: DC

## 2013-04-23 NOTE — Telephone Encounter (Signed)
progesterone (PROMETRIUM) 100 MG capsule  TAKE 1 CAPSULE AT BEDTIME, Normal, Last Dose: Not Recorded   Estradiol Acetate ([place]) 0.1 MG/24HR RING  Place 1 each vaginally every 3 (three) months., Starting 10/21/2012, Until Discontinued, Normal, Last Dose: Not Recorded   Patient wants refill of both med.   [facility] ORDER # [id number]

## 2013-04-23 NOTE — Patient Instructions (Signed)
Please call if you develop fever or pelvic pain.   We will contact you by phone to start the surgical scheduling process.

## 2013-04-23 NOTE — Telephone Encounter (Signed)
Last OV 04/23/2013 Progesterone last refill 10/29/2012 #90/ 1 refill Femring last refill 10/21/2012 # 1 / 3 refills.   Next AEX 07/15/2013.

## 2013-04-23 NOTE — Progress Notes (Signed)
   Subjective  Patient is here for a sonohysterogram for recurrent postmenopausal bleeding on HRT.  Patient is using Femring and taking Prometrium daily. Had EMB on 02/06/13 showing an endometrial polyp. Patient wanted to do observational management, and has had a recurrence of bleeding again a couple of weeks ago.  Objective  See ultrasound above.  Uterus with 16 mm endometrial thickening and 6 mm endocervical thickening. Some increased blood flow.  No fibroids.  Normal ovaries.  No free fluid.  Sonohysterogram  Consent performed. Speculum placed in vagina. Prep of cervix with betadine.  Cervical opening noted to be small. Tenaculum to anterior cervical lip. Cannula passed to the uterine fundus and withdrawn slightly. Saline injected and images obtained showing a filling defect at uterine fundus 16 x 13 mm and an endocervical filling defect 6 x 5 mm.  Assessment  Recurrent postmenopausal bleeding on HRT. Endometrial polyp  Plan  I discussed endometrial polyps with the patient. Will plan for a hysteroscopy with polypectomy and dilation and curettage.  ACOG handout for hysteroscopy and dilation and curettage.  Benefits and risks discussed including bleeding, infection, damage to surrounding organs including perforation of the uterus and subsequent laparoscopy, adhesive disease, aspiration, reaction to anesthesia, DVT, PE, and death.  Patient wishes to proceed.   15 minutes face to face time of which over 50% was spent in counseling.

## 2013-04-24 ENCOUNTER — Telehealth: Payer: Self-pay | Admitting: Obstetrics and Gynecology

## 2013-04-24 MED ORDER — PROGESTERONE MICRONIZED 100 MG PO CAPS: 100.0000 mg | ORAL_CAPSULE | Freq: Every day | ORAL | Status: DC

## 2013-04-24 MED ORDER — ESTRADIOL ACETATE 0.1 MG/24HR VA RING: 1.0000 | VAGINAL_RING | VAGINAL | Status: DC

## 2013-04-24 NOTE — Telephone Encounter (Signed)
04/23/13 Femring #1 ring 04/23/13 Progesterone #90/0 refills  Was sent to [facility]  S/w [facility] patient lost coverage with [facility] as of 04/08/14  S/w patient she no longer uses [facility] due to Insurance change, she uses [employer] now.  Re-ordered both rx's and sent them to [facility] mail order patient is aware, she will follow up with them first of the week if she has any issues she will call us back.

## 2013-04-24 NOTE — Telephone Encounter (Signed)
RX Order number: [id number]  Patient's pharmacy calling requesting refills on Femring and progerstone caps.

## 2013-04-24 NOTE — Telephone Encounter (Signed)
04/08/13 Typo

## 2013-04-30 ENCOUNTER — Telehealth: Payer: Self-pay | Admitting: Obstetrics and Gynecology

## 2013-04-30 ENCOUNTER — Ambulatory Visit (INDEPENDENT_AMBULATORY_CARE_PROVIDER_SITE_OTHER): Payer: Managed Care, Other (non HMO) | Admitting: Cardiology

## 2013-04-30 VITALS — BP 128/76 | HR 69 | Ht 65.0 in | Wt 150.7 lb

## 2013-04-30 DIAGNOSIS — E785 Hyperlipidemia, unspecified: Secondary | ICD-10-CM

## 2013-04-30 DIAGNOSIS — R0989 Other specified symptoms and signs involving the circulatory and respiratory systems: Secondary | ICD-10-CM

## 2013-04-30 DIAGNOSIS — R0609 Other forms of dyspnea: Secondary | ICD-10-CM

## 2013-04-30 DIAGNOSIS — R0600 Dyspnea, unspecified: Secondary | ICD-10-CM

## 2013-04-30 DIAGNOSIS — Z8249 Family history of ischemic heart disease and other diseases of the circulatory system: Secondary | ICD-10-CM

## 2013-04-30 NOTE — Patient Instructions (Signed)
Your physician has requested that you have an echocardiogram. Echocardiography is a painless test that uses sound waves to create images of your heart. It provides your doctor with information about the size and shape of your heart and how well your heart's chambers and valves are working. This procedure takes approximately one hour. There are no restrictions for this procedure.   Your physician wants you to follow-up in 12 months Dr [name].  You will receive a reminder letter in the mail two months in advance. If you don't receive a letter, please call our office to schedule the follow-up appointment.

## 2013-04-30 NOTE — Telephone Encounter (Signed)
Left message for patient to call back to discuss insurance benefits for upcoming surgery//ssf

## 2013-04-30 NOTE — Telephone Encounter (Signed)
Patient returning Sabrina's call. °

## 2013-05-01 ENCOUNTER — Encounter: Payer: Self-pay | Admitting: Family Medicine

## 2013-05-02 ENCOUNTER — Encounter: Payer: Self-pay | Admitting: Cardiology

## 2013-05-02 DIAGNOSIS — R0609 Other forms of dyspnea: Principal | ICD-10-CM | POA: Insufficient documentation

## 2013-05-02 DIAGNOSIS — R0600 Dyspnea, unspecified: Secondary | ICD-10-CM | POA: Insufficient documentation

## 2013-05-02 NOTE — Assessment & Plan Note (Signed)
She did outstandingly well on the CPET to him but there was some suggestion of some ischemic component. Will he do evaluate for diastolic dysfunction that could explain some of the dyspnea and some of the reduced cardiac output on CPET.  Plan: 2-D echocardiogram, this will also allow [place] to evaluate the possible mitral prolapse previously diagnosed.

## 2013-05-02 NOTE — Assessment & Plan Note (Signed)
Cc relatively healthy overall, but with potentially early stages of CAD noted on CPET results, despite outstanding peak VO2 levels, the findings would warrant slightly more aggressive risk factor modification, especially given her significant family history of premature coronary disease.

## 2013-05-02 NOTE — Assessment & Plan Note (Signed)
She had not started the simvastatin that was prescribed at last visit. I think would be results of the CPET chest she is at low risk, but there is these finding of potential preliminary stages of coronary disease. With that in mind, it I think it would be best if she started on low-dose statin for more aggressive lipid control and risk factor modification.
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
## 2013-05-06 ENCOUNTER — Telehealth: Payer: Self-pay | Admitting: Obstetrics and Gynecology

## 2013-05-06 NOTE — Telephone Encounter (Signed)
Telephone patient, advised that per insurance quote received, pr is $609.15 for surgery scheduled 02.24.2015. Payment is due in full 02.10.2015. Patient agreeable, but would like the benefits checked again closer to her payment due date as she feels that there may be money applied to her $1500 deductible at that time which may decrease her liability for this surgery.Edd Fabian

## 2013-05-10 HISTORY — PX: US ECHOCARDIOGRAPHY: HXRAD669

## 2013-05-12 ENCOUNTER — Ambulatory Visit (HOSPITAL_COMMUNITY)
Admission: RE | Admit: 2013-05-12 | Discharge: 2013-05-12 | Disposition: A | Discharge status: Home / Self Care | Payer: Managed Care, Other (non HMO) | Source: Ambulatory Visit | Attending: Internal Medicine | Admitting: Internal Medicine

## 2013-05-12 DIAGNOSIS — R0600 Dyspnea, unspecified: Secondary | ICD-10-CM

## 2013-05-12 DIAGNOSIS — R0602 Shortness of breath: Secondary | ICD-10-CM

## 2013-05-12 DIAGNOSIS — R0609 Other forms of dyspnea: Secondary | ICD-10-CM | POA: Insufficient documentation

## 2013-05-12 DIAGNOSIS — I059 Rheumatic mitral valve disease, unspecified: Secondary | ICD-10-CM | POA: Insufficient documentation

## 2013-05-12 DIAGNOSIS — R0989 Other specified symptoms and signs involving the circulatory and respiratory systems: Secondary | ICD-10-CM | POA: Insufficient documentation

## 2013-05-12 DIAGNOSIS — E785 Hyperlipidemia, unspecified: Secondary | ICD-10-CM | POA: Insufficient documentation

## 2013-05-12 DIAGNOSIS — I079 Rheumatic tricuspid valve disease, unspecified: Secondary | ICD-10-CM | POA: Insufficient documentation

## 2013-05-12 NOTE — Progress Notes (Signed)
2D Echo Performed 05/12/2013    [name], RCS

## 2013-05-12 NOTE — Telephone Encounter (Signed)
LMTCB concerning surgery instructions. 

## 2013-05-12 NOTE — Telephone Encounter (Signed)
Patient returning Amy's call.

## 2013-05-13 ENCOUNTER — Telehealth: Payer: Self-pay | Admitting: *Deleted

## 2013-05-13 NOTE — Telephone Encounter (Signed)
The hospital requires patient to have been seen within one month prior to a surgical procedure. Please schedule a brief appointment with me.

## 2013-05-13 NOTE — Telephone Encounter (Signed)
Surgery instructions reviewed and mailed to patient.  Post op scheduled.  Denies questions.  Was seen on 04-23-13 and procedure discussed.  Does she need preop?

## 2013-05-14 NOTE — Telephone Encounter (Signed)
Amy called patient and scheduled surgery consult and mailed instruction sheet.  Encounter closed.

## 2013-05-15 ENCOUNTER — Encounter: Payer: Self-pay | Admitting: Obstetrics and Gynecology

## 2013-05-15 ENCOUNTER — Ambulatory Visit (INDEPENDENT_AMBULATORY_CARE_PROVIDER_SITE_OTHER): Payer: Managed Care, Other (non HMO) | Admitting: Obstetrics and Gynecology

## 2013-05-15 VITALS — BP 128/76 | HR 65 | Resp 16 | Wt 149.0 lb

## 2013-05-15 DIAGNOSIS — N840 Polyp of corpus uteri: Secondary | ICD-10-CM

## 2013-05-15 DIAGNOSIS — N950 Postmenopausal bleeding: Secondary | ICD-10-CM

## 2013-05-15 NOTE — Progress Notes (Signed)
Patient ID: [name], female   DOB: 1955-11-29, 58 y.o.   MRN: [id number]  GYNECOLOGY PROBLEM VISIT  PCP:   Referring provider:   HPI: 58 y.o.   Married  Caucasian  female   G3P3 with Patient's last menstrual period was 12/15/2011.   here for discussion of surgery for postmenopausal bleeding and endometrial polyp.  Patient is using Femring and taking Prometrium daily. Had EMB on 02/06/13 showing an endometrial polyp. Patient wanted to do observational management, and has had a recurrence of bleeding.  Ultrasound and sonohysterogram performed on 04/23/13. Uterus with 16 mm endometrial thickening and 6 mm endocervical thickening. Some increased blood flow.  No fibroids.  Normal ovaries.  No free fluid. Saline injected and images obtained showing a filling defect at uterine fundus 16 x 13 mm and an endocervical filling defect 6 x 5 mm  Family history of cardiovascular disease - father and sister deceased of cardiovascular disease.  Patient had an exercise stress test.  Showed signs of early cardiovascular disease. Had an ECHO - results pending.  Started fish oil. Waiting to see if will start statins.   GYNECOLOGIC HISTORY: Patient's last menstrual period was 12/15/2011. Sexually active:  yes Partner preference: female Contraception:   Tubal ligation Menopausal hormone therapy: yes DES exposure:   no Blood transfusions:   no Sexually transmitted diseases:   no GYN Procedures:  Not sure Mammogram:    12/04/11 neg              Pap:   05/09/09 neg History of abnormal pap smear:  no    OB History   Grav Para Term Preterm Abortions TAB SAB Ect Mult Living   3 3        3          Family History  Problem Relation Age of Onset  . Coronary artery disease Father 31    MI?  . Other Mother 81    pulm aneurysm  . Stroke Maternal Grandmother   . Hypertension Sister   . Coronary artery disease Brother 71    s/p CABG  . Heart disease Sister 93    CHF, deceased  . Cancer Neg Hx    . Diabetes Neg Hx     Patient Active Problem List   Diagnosis Date Noted  . DOE (dyspnea on exertion) 05/02/2013  . Postmenopausal bleeding 02/07/2013  . Routine health maintenance 01/14/2013  . Family history of early CAD 01/14/2013  . Dyspnea 01/14/2013  . Vitamin D deficiency   . HLD (hyperlipidemia)   . Insomnia 11/21/2011    Past Medical History  Diagnosis Date  . Malignant melanoma 1988    L thigh, stage 3, yearly skin checks with Dr .Martinique at [facility]  . History of chicken pox   . Heart murmur     PVCs thought from MVP -- reportedly had echocardiogram in 2005 at [facility]  . Anxiety   . History of colon polyps   . Diverticulosis     by colonosocpy  . Vitamin D deficiency   . HLD (hyperlipidemia)     mild    Past Surgical History  Procedure Laterality Date  . Mandible surgery  1987  . Cesarean section  1982 and 1989  . Tubal ligation  1998  . Skin surgery  1988    melanoma excision  . Colonoscopy  2008    small hyperplastic polyps, small int hem, early diverticula (Mann) rpt 10 yrs  . Dilation and curettage  of uterus  1999  . Cardiovascular stress test (cpet)  2015    Low Risk Study, but evidence of early Myocardial Dysfunction    ALLERGIES: Review of patient's allergies indicates no known allergies.  Current Outpatient Prescriptions  Medication Sig Dispense Refill  . Ascorbic Acid (VITAMIN C) 1000 MG tablet Take by mouth daily.       . Estradiol Acetate (FEMRING) 0.1 MG/24HR RING Place 1 each vaginally every 3 (three) months.  1 each  0  . NON FORMULARY Mitomax; Alpha CRS; Microplex VM; xEO Mega; vEO Mega (all Essential Oil therapy)      . progesterone (PROMETRIUM) 100 MG capsule Take 1 capsule (100 mg total) by mouth daily.  90 capsule  0  . Vitamin D, Ergocalciferol, (DRISDOL) 50000 UNITS CAPS TAKE 1 CAPSULE EVERY OTHER WEEK  6 capsule  0  . atorvastatin (LIPITOR) 10 MG tablet Take 1 tablet (10 mg total) by mouth daily.  30 tablet  11    No current facility-administered medications for this visit.     ROS:  Pertinent items are noted in HPI.  SOCIAL HISTORY:  Married.  Works for [employer].  PHYSICAL EXAMINATION:    BP 128/76  Pulse 65  Resp 16  Wt 149 lb (67.586 kg)  LMP 12/15/2011   Wt Readings from Last 3 Encounters:  05/15/13 149 lb (67.586 kg)  04/30/13 150 lb 11.2 oz (68.357 kg)  04/23/13 150 lb (68.04 kg)     Ht Readings from Last 3 Encounters:  04/30/13 5\' 5"  (1.651 m)  02/11/13 5' 5.5" (1.664 m)  01/14/13 5\' 5"  (1.651 m)    General appearance: alert, cooperative and appears stated age Head: Normocephalic, without obvious abnormality, atraumatic Neck: no adenopathy, supple, symmetrical, trachea midline and thyroid not enlarged, symmetric, no tenderness/mass/nodules Lungs: clear to auscultation bilaterally Heart: regular rate and rhythm Abdomen: soft, non-tender; no masses,  no organomegaly Extremities: extremities normal, atraumatic, no cyanosis or edema Skin: Skin color, texture, turgor normal. No rashes or lesions Lymph nodes: Cervical, supraclavicular, and axillary nodes normal. No abnormal inguinal nodes palpated Neurologic: Grossly normal  Pelvic: External genitalia:  no lesions              Urethra:  normal appearing urethra with no masses, tenderness or lesions              Bartholins and Skenes: normal                 Vagina: normal appearing vagina with normal color and discharge, no lesions. Femring in place.              Cervix: normal appearance                      Bimanual Exam:  Uterus:  uterus is normal size, shape, consistency and nontender                                      Adnexa: normal adnexa in size, nontender and no masses                                      Rectovaginal: Confirms  Anus:  normal sphincter tone, no lesions  ASSESSMENT  Postmenopausal bleeding. Endometrial polyp. HRT patient.   PLAN  Discussed hysteroscopic  polypectomy with dilation and curettage.  Risks. Benefits, and alternatives reviewed. Will proceed on 06/02/13. Return for post op visit.  Will need to have patient discuss with her cardiologist whether or not it is appropriate for her to continue on HRT.   An After Visit Summary was printed and given to the patient.  15 minutes face to face time of which over 50% was spent in counseling.

## 2013-05-16 NOTE — Progress Notes (Signed)
Quick Note:  Ok - this is great. Echocardiographically normal heart - pump function & relaxation with minimal valve disease.  The abnormal finding on CPET (Bicycle test) is probably related to Microvascular (small-vessel) disease as an early precursor to Coronary Artery disease.  Risk Factor Modification is crucial as this can be reversed.  Leonie Man, MD  ______

## 2013-05-21 ENCOUNTER — Encounter (HOSPITAL_COMMUNITY): Payer: Self-pay

## 2013-05-22 ENCOUNTER — Telehealth: Payer: Self-pay | Admitting: *Deleted

## 2013-05-22 NOTE — Telephone Encounter (Signed)
Left message on voicemail .-- results on My Chart Any question call back to the office.

## 2013-05-22 NOTE — Telephone Encounter (Signed)
Message copied by Raiford Simmonds on Fri May 22, 2013  3:38 PM ------      Message from: Leonie Man      Created: Sat May 16, 2013  5:15 PM       Ok - this is great.  Echocardiographically normal heart - pump function & relaxation with minimal valve disease.        The abnormal finding on CPET (Bicycle test) is probably related to Microvascular (small-vessel) disease as an early precursor to Coronary Artery disease.            Risk Factor Modification is crucial as this can be reversed.            Leonie Man, MD       ------

## 2013-05-25 ENCOUNTER — Telehealth: Payer: Self-pay | Admitting: *Deleted

## 2013-05-25 NOTE — Telephone Encounter (Signed)
Requesting orders for PAT appt tomorrow.

## 2013-05-26 ENCOUNTER — Encounter (HOSPITAL_COMMUNITY): Payer: Self-pay | Admitting: Pharmacist

## 2013-05-26 ENCOUNTER — Encounter (HOSPITAL_COMMUNITY)
Admission: RE | Admit: 2013-05-26 | Discharge: 2013-05-26 | Disposition: A | Discharge status: Home / Self Care | Payer: Managed Care, Other (non HMO) | Source: Ambulatory Visit | Attending: Obstetrics and Gynecology | Admitting: Obstetrics and Gynecology

## 2013-05-26 ENCOUNTER — Encounter (HOSPITAL_COMMUNITY): Payer: Self-pay

## 2013-05-26 ENCOUNTER — Other Ambulatory Visit: Payer: Self-pay | Admitting: Obstetrics and Gynecology

## 2013-05-26 DIAGNOSIS — Z01812 Encounter for preprocedural laboratory examination: Secondary | ICD-10-CM | POA: Insufficient documentation

## 2013-05-26 LAB — CBC
HCT: 38 % (ref 36.0–46.0)
Hemoglobin: 12.8 g/dL (ref 12.0–15.0)
MCH: 31.4 pg (ref 26.0–34.0)
MCHC: 33.7 g/dL (ref 30.0–36.0)
MCV: 93.4 fL (ref 78.0–100.0)
PLATELETS: 229 10*3/uL (ref 150–400)
RBC: 4.07 MIL/uL (ref 3.87–5.11)
RDW: 12.5 % (ref 11.5–15.5)
WBC: 5.3 10*3/uL (ref 4.0–10.5)

## 2013-05-26 LAB — URINALYSIS, ROUTINE W REFLEX MICROSCOPIC
BILIRUBIN URINE: NEGATIVE
Glucose, UA: NEGATIVE mg/dL
Ketones, ur: NEGATIVE mg/dL
NITRITE: NEGATIVE
Protein, ur: NEGATIVE mg/dL
Specific Gravity, Urine: 1.01 (ref 1.005–1.030)
Urobilinogen, UA: 0.2 mg/dL (ref 0.0–1.0)
pH: 5.5 (ref 5.0–8.0)

## 2013-05-26 LAB — URINE MICROSCOPIC-ADD ON

## 2013-05-26 NOTE — Telephone Encounter (Signed)
Done

## 2013-05-26 NOTE — Pre-Procedure Instructions (Signed)
Cardiac history and testing results reviewed by Dr Royce Macadamia. No orders given.

## 2013-05-26 NOTE — Patient Instructions (Signed)
[place]  05/26/2013   Your procedure is scheduled on:  06/02/13  Enter through the Main Entrance of [facility] at [place] up the phone at the desk and dial [phone].   Call this number if you have problems the morning of surgery: [phone]   Remember:   Do not eat food:After Midnight.  Do not drink clear liquids: After Midnight.  Take these medicines the morning of surgery with A SIP OF WATER: NA   Do not wear jewelry, make-up or nail polish.  Do not wear lotions, powders, or perfumes. You may wear deodorant.  Do not shave 48 hours prior to surgery.  Do not bring valuables to the hospital.  [facility] is not   responsible for any belongings or valuables brought to the hospital.  Contacts, dentures or bridgework may not be worn into surgery.  Leave suitcase in the car. After surgery it may be brought to your room.  For patients admitted to the hospital, checkout time is 11:00 AM the day of              discharge.   Patients discharged the day of surgery will not be allowed to drive             home.  Name and phone number of your driver: [name]   husband  Special Instructions:   Shower using CHG 2 nights before surgery and the night before surgery.  If you shower the day of surgery use CHG.  Use special wash - you have one bottle of CHG for all showers.  You should use approximately 1/3 of the bottle for each shower.   Please read over the following fact sheets that you were given:   Surgical Site Infection Prevention

## 2013-05-27 LAB — URINE CULTURE
COLONY COUNT: NO GROWTH
Culture: NO GROWTH
SPECIAL REQUESTS: NORMAL

## 2013-06-02 ENCOUNTER — Encounter (HOSPITAL_COMMUNITY)
Admission: RE | Disposition: A | Discharge status: Home / Self Care | Payer: Self-pay | Source: Ambulatory Visit | Attending: Obstetrics and Gynecology

## 2013-06-02 ENCOUNTER — Encounter (HOSPITAL_COMMUNITY): Payer: Managed Care, Other (non HMO) | Admitting: Anesthesiology

## 2013-06-02 ENCOUNTER — Ambulatory Visit (HOSPITAL_COMMUNITY): Payer: Managed Care, Other (non HMO) | Admitting: Anesthesiology

## 2013-06-02 ENCOUNTER — Ambulatory Visit (HOSPITAL_COMMUNITY)
Admission: RE | Admit: 2013-06-02 | Discharge: 2013-06-02 | Disposition: A | Discharge status: Home / Self Care | Payer: Managed Care, Other (non HMO) | Source: Ambulatory Visit | Attending: Obstetrics and Gynecology | Admitting: Obstetrics and Gynecology

## 2013-06-02 ENCOUNTER — Encounter (HOSPITAL_COMMUNITY): Payer: Self-pay | Admitting: Anesthesiology

## 2013-06-02 DIAGNOSIS — R0609 Other forms of dyspnea: Secondary | ICD-10-CM | POA: Insufficient documentation

## 2013-06-02 DIAGNOSIS — N950 Postmenopausal bleeding: Secondary | ICD-10-CM

## 2013-06-02 DIAGNOSIS — E559 Vitamin D deficiency, unspecified: Secondary | ICD-10-CM | POA: Insufficient documentation

## 2013-06-02 DIAGNOSIS — R9389 Abnormal findings on diagnostic imaging of other specified body structures: Secondary | ICD-10-CM | POA: Insufficient documentation

## 2013-06-02 DIAGNOSIS — I059 Rheumatic mitral valve disease, unspecified: Secondary | ICD-10-CM | POA: Insufficient documentation

## 2013-06-02 DIAGNOSIS — Z87891 Personal history of nicotine dependence: Secondary | ICD-10-CM | POA: Insufficient documentation

## 2013-06-02 DIAGNOSIS — I509 Heart failure, unspecified: Secondary | ICD-10-CM | POA: Insufficient documentation

## 2013-06-02 DIAGNOSIS — F411 Generalized anxiety disorder: Secondary | ICD-10-CM | POA: Insufficient documentation

## 2013-06-02 DIAGNOSIS — N840 Polyp of corpus uteri: Secondary | ICD-10-CM

## 2013-06-02 DIAGNOSIS — E785 Hyperlipidemia, unspecified: Secondary | ICD-10-CM | POA: Insufficient documentation

## 2013-06-02 DIAGNOSIS — I2510 Atherosclerotic heart disease of native coronary artery without angina pectoris: Secondary | ICD-10-CM | POA: Insufficient documentation

## 2013-06-02 DIAGNOSIS — R0989 Other specified symptoms and signs involving the circulatory and respiratory systems: Secondary | ICD-10-CM | POA: Insufficient documentation

## 2013-06-02 HISTORY — PX: DILATATION & CURRETTAGE/HYSTEROSCOPY WITH RESECTOCOPE: SHX5572

## 2013-06-02 HISTORY — DX: Atherosclerotic heart disease of native coronary artery without angina pectoris: I25.10

## 2013-06-02 LAB — BASIC METABOLIC PANEL
BUN: 14 mg/dL (ref 6–23)
CALCIUM: 8.6 mg/dL (ref 8.4–10.5)
CO2: 26 meq/L (ref 19–32)
CREATININE: 0.56 mg/dL (ref 0.50–1.10)
Chloride: 104 mEq/L (ref 96–112)
GFR calc Af Amer: >90 mL/min (ref >90)
GFR calc non Af Amer: >90 mL/min (ref >90)
Glucose, Bld: 72 mg/dL (ref 70–99)
Potassium: 4.1 mEq/L (ref 3.7–5.3)
Sodium: 139 mEq/L (ref 137–147)

## 2013-06-02 SURGERY — DILATATION & CURETTAGE/HYSTEROSCOPY WITH RESECTOCOPE
Anesthesia: General | Site: Vagina

## 2013-06-02 MED ORDER — DEXAMETHASONE SODIUM PHOSPHATE 10 MG/ML IJ SOLN
INTRAMUSCULAR | Status: AC
  Filled 2013-06-02: qty 1

## 2013-06-02 MED ORDER — GLYCOPYRROLATE 0.2 MG/ML IJ SOLN
INTRAMUSCULAR | Status: DC | PRN
  Administered 2013-06-02: 0.3 mg via INTRAVENOUS

## 2013-06-02 MED ORDER — GLYCOPYRROLATE 0.2 MG/ML IJ SOLN
INTRAMUSCULAR | Status: AC
  Filled 2013-06-02: qty 2

## 2013-06-02 MED ORDER — LIDOCAINE HCL 1 % IJ SOLN
INTRAMUSCULAR | Status: DC | PRN
  Administered 2013-06-02: 10 mL

## 2013-06-02 MED ORDER — MIDAZOLAM HCL 2 MG/2ML IJ SOLN
INTRAMUSCULAR | Status: DC | PRN
  Administered 2013-06-02: 2 mg via INTRAVENOUS

## 2013-06-02 MED ORDER — LIDOCAINE HCL (CARDIAC) 20 MG/ML IV SOLN
INTRAVENOUS | Status: AC
  Filled 2013-06-02: qty 5

## 2013-06-02 MED ORDER — IBUPROFEN 600 MG PO TABS: 600.0000 mg | ORAL_TABLET | Freq: Four times a day (QID) | ORAL | Status: DC | PRN

## 2013-06-02 MED ORDER — MIDAZOLAM HCL 2 MG/2ML IJ SOLN
INTRAMUSCULAR | Status: AC
  Filled 2013-06-02: qty 2

## 2013-06-02 MED ORDER — LIDOCAINE HCL (CARDIAC) 20 MG/ML IV SOLN
INTRAVENOUS | Status: DC | PRN
  Administered 2013-06-02: 30 mg via INTRAVENOUS
  Administered 2013-06-02: 70 mg via INTRAVENOUS

## 2013-06-02 MED ORDER — KETOROLAC TROMETHAMINE 30 MG/ML IJ SOLN
INTRAMUSCULAR | Status: AC
  Filled 2013-06-02: qty 1

## 2013-06-02 MED ORDER — KETOROLAC TROMETHAMINE 30 MG/ML IJ SOLN
INTRAMUSCULAR | Status: DC | PRN
  Administered 2013-06-02: 30 mg via INTRAVENOUS

## 2013-06-02 MED ORDER — PROPOFOL 10 MG/ML IV EMUL
INTRAVENOUS | Status: AC
  Filled 2013-06-02: qty 20

## 2013-06-02 MED ORDER — ONDANSETRON HCL 4 MG/2ML IJ SOLN
INTRAMUSCULAR | Status: AC
  Filled 2013-06-02: qty 2

## 2013-06-02 MED ORDER — LACTATED RINGERS IV SOLN
INTRAVENOUS | Status: DC
  Administered 2013-06-02: 12:00:00 via INTRAVENOUS

## 2013-06-02 MED ORDER — PROPOFOL 10 MG/ML IV BOLUS
INTRAVENOUS | Status: DC | PRN
  Administered 2013-06-02: 180 mg via INTRAVENOUS

## 2013-06-02 MED ORDER — LACTATED RINGERS IV SOLN
INTRAVENOUS | Status: DC
  Administered 2013-06-02: 09:00:00 via INTRAVENOUS

## 2013-06-02 MED ORDER — EPHEDRINE SULFATE 50 MG/ML IJ SOLN
INTRAMUSCULAR | Status: DC | PRN
  Administered 2013-06-02: 10 mg via INTRAVENOUS

## 2013-06-02 MED ORDER — FENTANYL CITRATE 0.05 MG/ML IJ SOLN
INTRAMUSCULAR | Status: AC
  Filled 2013-06-02: qty 2

## 2013-06-02 MED ORDER — GLYCINE 1.5 % IR SOLN
Status: DC | PRN
  Administered 2013-06-02: 3000 mL

## 2013-06-02 MED ORDER — FENTANYL CITRATE 0.05 MG/ML IJ SOLN
INTRAMUSCULAR | Status: DC | PRN
  Administered 2013-06-02 (×2): 50 ug via INTRAVENOUS

## 2013-06-02 MED ORDER — EPHEDRINE 5 MG/ML INJ
INTRAVENOUS | Status: AC
  Filled 2013-06-02: qty 10

## 2013-06-02 MED ORDER — LIDOCAINE HCL 1 % IJ SOLN
INTRAMUSCULAR | Status: AC
  Filled 2013-06-02: qty 20

## 2013-06-02 MED ORDER — ONDANSETRON HCL 4 MG/2ML IJ SOLN
INTRAMUSCULAR | Status: DC | PRN
  Administered 2013-06-02: 4 mg via INTRAVENOUS

## 2013-06-02 SURGICAL SUPPLY — 22 items
CANISTER SUCT 3000ML (MISCELLANEOUS) ×2 IMPLANT
CATH ROBINSON RED A/P 16FR (CATHETERS) ×2 IMPLANT
CLOTH BEACON ORANGE TIMEOUT ST (SAFETY) ×2 IMPLANT
CONTAINER PREFILL 10% NBF 60ML (FORM) ×4 IMPLANT
DRAPE HYSTEROSCOPY (DRAPE) ×2 IMPLANT
DRSG TELFA 3X8 NADH (GAUZE/BANDAGES/DRESSINGS) ×2 IMPLANT
ELECT REM PT RETURN 9FT ADLT (ELECTROSURGICAL) ×2
ELECTRODE REM PT RTRN 9FT ADLT (ELECTROSURGICAL) ×1 IMPLANT
GLOVE BIO SURGEON STRL SZ 6.5 (GLOVE) ×2 IMPLANT
GLOVE BIOGEL PI IND STRL 7.0 (GLOVE) ×1 IMPLANT
GLOVE BIOGEL PI INDICATOR 7.0 (GLOVE) ×1
GOWN STRL REUS W/TWL LRG LVL3 (GOWN DISPOSABLE) ×4 IMPLANT
LOOP ANGLED CUTTING 22FR (CUTTING LOOP) ×2 IMPLANT
NEEDLE SPNL 20GX3.5 QUINCKE YW (NEEDLE) ×2 IMPLANT
NEEDLE SPNL 22GX3.5 QUINCKE BK (NEEDLE) ×2 IMPLANT
PACK VAGINAL MINOR WOMEN LF (CUSTOM PROCEDURE TRAY) ×2 IMPLANT
PAD OB MATERNITY 4.3X12.25 (PERSONAL CARE ITEMS) ×2 IMPLANT
SET TUBING HYSTEROSCOPY 2 NDL (TUBING) ×2 IMPLANT
SYR CONTROL 10ML LL (SYRINGE) ×2 IMPLANT
TOWEL OR 17X24 6PK STRL BLUE (TOWEL DISPOSABLE) ×4 IMPLANT
TUBE HYSTEROSCOPY W Y-CONNECT (TUBING) ×2 IMPLANT
WATER STERILE IRR 1000ML POUR (IV SOLUTION) ×2 IMPLANT

## 2013-06-02 NOTE — Brief Op Note (Signed)
06/02/2013  11:31 AM  PATIENT:  [name]  58 y.o. female  PRE-OPERATIVE DIAGNOSIS:  PMB, endometrial polyp  POST-OPERATIVE DIAGNOSIS:  post menopausal bleeding, endometrial polyp  PROCEDURE:  Procedure(s): DILATATION & CURETTAGE/HYSTEROSCOPY WITH RESECTOCOPE (N/A), POLYPECTOMY  SURGEON:  Surgeon(s) and Role:    * Brook E Amundson de Berton Lan, MD - Primary  PHYSICIAN ASSISTANT:   ASSISTANTS: none   ANESTHESIA:   paracervical block and LMA  EBL:  Total I/O In: 900 [I.V.:900] Out: 150 [Urine:150]  GLYCINE DEFICIT:  75 cc  BLOOD ADMINISTERED:none  DRAINS: none   LOCAL MEDICATIONS USED:  LIDOCAINE  and Amount:  10 ml  SPECIMEN:  Source of Specimen:   Endometrial polyp, endometrial curettings  DISPOSITION OF SPECIMEN:  PATHOLOGY  COUNTS:  YES  TOURNIQUET:  * No tourniquets in log *  DICTATION: .Other Dictation: Dictation Number    PLAN OF CARE: Discharge to home after PACU  PATIENT DISPOSITION:  PACU - hemodynamically stable.   Delay start of Pharmacological VTE agent (>24hrs) due to surgical blood loss or risk of bleeding: not applicable

## 2013-06-02 NOTE — Progress Notes (Signed)
Update to History and Physical  Cardiac ECHO showing trivial mitral regurgitation, otherwise normal.  OK to proceed with surgery.

## 2013-06-02 NOTE — H&P (Signed)
Amy Quintela E Amundson de Berton Lan, MD at 05/15/2013 11:44 AM      Status: Signed            Patient ID: [name], female   DOB: 24-Jul-1955, 58 y.o.   MRN: [id number]  GYNECOLOGY PROBLEM VISIT  PCP:   Referring provider:   HPI: 58 y.o.   Married  Caucasian  female    G3P3 with Patient's last menstrual period was 12/15/2011.    here for discussion of surgery for postmenopausal bleeding and endometrial polyp.  Patient is using Femring and taking Prometrium daily. Had EMB on 02/06/13 showing an endometrial polyp. Patient wanted to do observational management, and has had a recurrence of bleeding.  Ultrasound and sonohysterogram performed on 04/23/13. Uterus with 16 mm endometrial thickening and 6 mm endocervical thickening. Some increased blood flow.  No fibroids.  Normal ovaries.  No free fluid. Saline injected and images obtained showing a filling defect at uterine fundus 16 x 13 mm and an endocervical filling defect 6 x 5 mm  Family history of cardiovascular disease - father and sister deceased of cardiovascular disease.   Patient had an exercise stress test.  Showed signs of early cardiovascular disease. Had an ECHO - results pending.   Started fish oil. Waiting to see if will start statins.   GYNECOLOGIC HISTORY: Patient's last menstrual period was 12/15/2011. Sexually active:  yes Partner preference: female Contraception:   Tubal ligation Menopausal hormone therapy: yes DES exposure:   no Blood transfusions:   no Sexually transmitted diseases:   no GYN Procedures:  Not sure Mammogram:    12/04/11 neg              Pap:   05/09/09 neg History of abnormal pap smear:  no     OB History     Grav  Para  Term  Preterm  Abortions  TAB  SAB  Ect  Mult  Living     3  3                3              Family History   Problem  Relation  Age of Onset   .  Coronary artery disease  Father  27       MI?   .  Other  Mother  63       pulm aneurysm   .  Stroke  Maternal  Grandmother     .  Hypertension  Sister     .  Coronary artery disease  Brother  42       s/p CABG   .  Heart disease  Sister  33       CHF, deceased   .  Cancer  Neg Hx     .  Diabetes  Neg Hx         Patient Active Problem List     Diagnosis  Date Noted   .  DOE (dyspnea on exertion)  05/02/2013   .  Postmenopausal bleeding  02/07/2013   .  Routine health maintenance  01/14/2013   .  Family history of early CAD  01/14/2013   .  Dyspnea  01/14/2013   .  Vitamin D deficiency     .  HLD (hyperlipidemia)     .  Insomnia  11/21/2011       Past Medical History   Diagnosis  Date   .  Malignant melanoma  1988       L thigh, stage 3, yearly skin checks with Dr .[place] at [facility]   .  History of chicken pox     .  Heart murmur         PVCs thought from MVP -- reportedly had echocardiogram in 2005 at [facility]   .  Anxiety     .  History of colon polyps     .  Diverticulosis         by colonosocpy   .  Vitamin D deficiency     .  HLD (hyperlipidemia)         mild       Past Surgical History   Procedure  Laterality  Date   .  Mandible surgery    1987   .  Cesarean section    1982 and 1989   .  Tubal ligation    1998   .  Skin surgery    1988       melanoma excision   .  Colonoscopy    2008       small hyperplastic polyps, small int hem, early diverticula (Mann) rpt 10 yrs   .  Dilation and curettage of uterus    1999   .  Cardiovascular stress test (cpet)    2015       Low Risk Study, but evidence of early Myocardial Dysfunction     ALLERGIES: Review of patient's allergies indicates no known allergies.    Current Outpatient Prescriptions   Medication  Sig  Dispense  Refill   .  Ascorbic Acid (VITAMIN C) 1000 MG tablet  Take by mouth daily.          .  Estradiol Acetate (FEMRING) 0.1 MG/24HR RING  Place 1 each vaginally every 3 (three) months.   1 each   0   .  NON FORMULARY  Mitomax; Alpha CRS; Microplex VM; xEO Mega; vEO Mega (all Essential Oil therapy)          .  progesterone (PROMETRIUM) 100 MG capsule  Take 1 capsule (100 mg total) by mouth daily.   90 capsule   0   .  Vitamin D, Ergocalciferol, (DRISDOL) 50000 UNITS CAPS  TAKE 1 CAPSULE EVERY OTHER WEEK   6 capsule   0   .  atorvastatin (LIPITOR) 10 MG tablet  Take 1 tablet (10 mg total) by mouth daily.   30 tablet   11      No current facility-administered medications for this visit.      ROS:  Pertinent items are noted in HPI.  SOCIAL HISTORY:  Married.  Works for [employer].  PHYSICAL EXAMINATION:    BP 128/76  Pulse 65  Resp 16  Wt 149 lb (67.586 kg)  LMP 12/15/2011    Wt Readings from Last 3 Encounters:   05/15/13  149 lb (67.586 kg)   04/30/13  150 lb 11.2 oz (68.357 kg)   04/23/13  150 lb (68.04 kg)       Ht Readings from Last 3 Encounters:   04/30/13  5\' 5"  (1.651 m)   02/11/13  5' 5.5" (1.664 m)   01/14/13  5\' 5"  (1.651 m)     General appearance: alert, cooperative and appears stated age Head: Normocephalic, without obvious abnormality, atraumatic Neck: no adenopathy, supple, symmetrical, trachea midline and thyroid not enlarged, symmetric, no tenderness/mass/nodules Lungs: clear to auscultation bilaterally Heart: regular rate and rhythm Abdomen: soft,  non-tender; no masses,  no organomegaly Extremities: extremities normal, atraumatic, no cyanosis or edema Skin: Skin color, texture, turgor normal. No rashes or lesions Lymph nodes: Cervical, supraclavicular, and axillary nodes normal. No abnormal inguinal nodes palpated Neurologic: Grossly normal  Pelvic: External genitalia:  no lesions              Urethra:  normal appearing urethra with no masses, tenderness or lesions              Bartholins and Skenes: normal                  Vagina: normal appearing vagina with normal color and discharge, no lesions. Femring in place.              Cervix: normal appearance                       Bimanual Exam:  Uterus:  uterus is normal size, shape, consistency and  nontender                                      Adnexa: normal adnexa in size, nontender and no masses                                      Rectovaginal: Confirms                                      Anus:  normal sphincter tone, no lesions  ASSESSMENT  Postmenopausal bleeding. Endometrial polyp. HRT patient.   PLAN  Discussed hysteroscopic polypectomy with dilation and curettage.  Risks. Benefits, and alternatives reviewed. Will proceed on 06/02/13. Return for post op visit.   Will need to have patient discuss with her cardiologist whether or not it is appropriate for her to continue on HRT.    An After Visit Summary was printed and given to the patient.  15 minutes face to face time of which over 50% was spent in counseling.

## 2013-06-02 NOTE — Discharge Instructions (Addendum)
Hysteroscopy Hysteroscopy is a procedure used for looking inside the womb (uterus). It may be done for various reasons, including:  To evaluate abnormal bleeding, fibroid (benign, noncancerous) tumors, polyps, scar tissue (adhesions), and possibly cancer of the uterus.  To look for lumps (tumors) and other uterine growths.  To look for causes of why a woman cannot get pregnant (infertility), causes of recurrent loss of pregnancy (miscarriages), or a lost intrauterine device (IUD).  To perform a sterilization by blocking the fallopian tubes from inside the uterus. In this procedure, a thin, flexible tube with a tiny light and camera on the end of it (hysteroscope) is used to look inside the uterus. A hysteroscopy should be done right after a menstrual period to be sure you are not pregnant. LET [facility] CARE PROVIDER KNOW ABOUT:   Any allergies you have.  All medicines you are taking, including vitamins, herbs, eye drops, creams, and over-the-counter medicines.  Previous problems you or members of your family have had with the use of anesthetics.  Any blood disorders you have.  Previous surgeries you have had.  Medical conditions you have. RISKS AND COMPLICATIONS  Generally, this is a safe procedure. However, as with any procedure, complications can occur. Possible complications include:  Putting a hole in the uterus.  Excessive bleeding.  Infection.  Damage to the cervix.  Injury to other organs.  Allergic reaction to medicines.  Too much fluid used in the uterus for the procedure. BEFORE THE PROCEDURE   Ask your health care provider about changing or stopping any regular medicines.  Do not take aspirin or blood thinners for 1 week before the procedure, or as directed by your health care provider. These can cause bleeding.  If you smoke, do not smoke for 2 weeks before the procedure.  In some cases, a medicine is placed in the cervix the day before the procedure.  This medicine makes the cervix have a larger opening (dilate). This makes it easier for the instrument to be inserted into the uterus during the procedure.  Do not eat or drink anything for at least 8 hours before the surgery.  Arrange for someone to take you home after the procedure. PROCEDURE   You may be given a medicine to relax you (sedative). You may also be given one of the following:  A medicine that numbs the area around the cervix (local anesthetic).  A medicine that makes you sleep through the procedure (general anesthetic).  The hysteroscope is inserted through the vagina into the uterus. The camera on the hysteroscope sends a picture to a TV screen. This gives the surgeon a good view inside the uterus.  During the procedure, air or a liquid is put into the uterus, which allows the surgeon to see better.  Sometimes, tissue is gently scraped from inside the uterus. These tissue samples are sent to a lab for testing. AFTER THE PROCEDURE   If you had a general anesthetic, you may be groggy for a couple hours after the procedure.  If you had a local anesthetic, you will be able to go home as soon as you are stable and feel ready.  You may have some cramping. This normally lasts for a couple days.  You may have bleeding, which varies from light spotting for a few days to menstrual-like bleeding for 3 7 days. This is normal.  If your test results are not back during the visit, make an appointment with your health care provider to find out  the results. Document Released: 07/02/2000 Document Revised: 01/14/2013 Document Reviewed: 10/23/2012 [facility] Patient Information 2014 [place], [place].  Hysteroscopy, Care After Refer to this sheet in the next few weeks. These instructions provide you with information on caring for yourself after your procedure. Your health care provider may also give you more specific instructions. Your treatment has been planned according to current medical  practices, but problems sometimes occur. Call your health care provider if you have any problems or questions after your procedure.  WHAT TO EXPECT AFTER THE PROCEDURE After your procedure, it is typical to have the following:  You may have some cramping. This normally lasts for a couple days.  You may have bleeding. This can vary from light spotting for a few days to menstrual-like bleeding for 3 7 days. HOME CARE INSTRUCTIONS  Rest for the first 1 2 days after the procedure.  Only take over-the-counter or prescription medicines as directed by your health care provider. Do not take aspirin. It can increase the chances of bleeding.  Take showers instead of baths for 2 weeks or as directed by your health care provider.  Do not drive for 24 hours or as directed.  Do not drink alcohol while taking pain medicine.  Do not use tampons, douche, or have sexual intercourse for 2 weeks or until your health care provider says it is okay.  Take your temperature twice a day for 4 5 days. Write it down each time.  Follow your health care provider's advice about diet, exercise, and lifting.  If you develop constipation, you may:  Take a mild laxative if your health care provider approves.  Add bran foods to your diet.  Drink enough fluids to keep your urine clear or pale yellow.  Try to have someone with you or available to you for the first 24 48 hours, especially if you were given a general anesthetic.  Follow up with your health care provider as directed. SEEK MEDICAL CARE IF:  You feel dizzy or lightheaded.  You feel sick to your stomach (nauseous).  You have abnormal vaginal discharge.  You have a rash.  You have pain that is not controlled with medicine. SEEK IMMEDIATE MEDICAL CARE IF:  You have bleeding that is heavier than a normal menstrual period.  You have a fever.  You have increasing cramps or pain, not controlled with medicine.  You have new belly (abdominal)  pain.  You pass out.  You have pain in the tops of your shoulders (shoulder strap areas).  You have shortness of breath. Document Released: 01/14/2013 Document Reviewed: 10/23/2012 [facility] Patient Information 2014 [place], [place]. DISCHARGE INSTRUCTIONS: D&C / D&E The following instructions have been prepared to help you care for yourself upon your return home.   Personal hygiene:  Use sanitary pads for vaginal drainage, not tampons.  Shower the day after your procedure.  NO tub baths, pools or Jacuzzis for 2-3 weeks.  Wipe front to back after using the bathroom.  Activity and limitations:  Do NOT drive or operate any equipment for 24 hours. The effects of anesthesia are still present and drowsiness may result.  Do NOT rest in bed all day.  Walking is encouraged.  Walk up and down stairs slowly.  You may resume your normal activity in one to two days or as indicated by your physician.  Sexual activity: NO intercourse for at least 2 weeks after the procedure, or as indicated by your physician.  Diet: Eat a light meal as desired this  evening. You may resume your usual diet tomorrow.  Return to work: You may resume your work activities in one to two days or as indicated by your doctor.  What to expect after your surgery: Expect to have vaginal bleeding/discharge for 2-3 days and spotting for up to 10 days. It is not unusual to have soreness for up to 1-2 weeks. You may have a slight burning sensation when you urinate for the first day. Mild cramps may continue for a couple of days. You may have a regular period in 2-6 weeks.  Call your doctor for any of the following:  Excessive vaginal bleeding, saturating and changing one pad every hour.  Inability to urinate 6 hours after discharge from hospital.  Pain not relieved by pain medication.  Fever of 100.4 F or greater.  Unusual vaginal discharge or odor.   Call for an appointment:    Patients signature:  ______________________  Nurses signature ________________________  Support person's signature_______________________

## 2013-06-02 NOTE — Anesthesia Preprocedure Evaluation (Addendum)
Anesthesia Evaluation  Patient identified by MRN, date of birth, ID band Patient awake    Reviewed: Allergy & Precautions, H&P , NPO status , Patient's Chart, lab work & pertinent test results  History of Anesthesia Complications Negative for: history of anesthetic complications  Airway Mallampati: III TM Distance: >3 FB Neck ROM: Full    Dental  (+) Teeth Intact   Pulmonary former smoker,    Pulmonary exam normal       Cardiovascular - angina+ DOE - CAD and - CHF - dysrhythmias - Valvular Problems/MurmursRhythm:Regular Rate:Normal     Neuro/Psych Anxiety    GI/Hepatic negative GI ROS, Neg liver ROS,   Endo/Other  negative endocrine ROS  Renal/GU negative Renal ROS     Musculoskeletal negative musculoskeletal ROS (+)   Abdominal   Peds  Hematology negative hematology ROS (+)   Anesthesia Other Findings   Reproductive/Obstetrics                         Anesthesia Physical Anesthesia Plan  ASA: II  Anesthesia Plan: General   Post-op Pain Management:    Induction: Intravenous  Airway Management Planned: LMA  Additional Equipment: None  Intra-op Plan:   Post-operative Plan: Extubation in OR  Informed Consent: I have reviewed the patients History and Physical, chart, labs and discussed the procedure including the risks, benefits and alternatives for the proposed anesthesia with the patient or authorized representative who has indicated his/her understanding and acceptance.   Dental advisory given  Plan Discussed with: CRNA and Surgeon  Anesthesia Plan Comments:        Anesthesia Quick Evaluation

## 2013-06-02 NOTE — Transfer of Care (Signed)
Immediate Anesthesia Transfer of Care Note  Patient: [name]  Procedure(s) Performed: Procedure(s): DILATATION & CURETTAGE/HYSTEROSCOPY WITH RESECTOCOPE (N/A)  Patient Location: PACU  Anesthesia Type:General  Level of Consciousness: awake, oriented and patient cooperative  Airway & Oxygen Therapy: Patient Spontanous Breathing and Patient connected to nasal cannula oxygen  Post-op Assessment: Report given to PACU RN and Post -op Vital signs reviewed and stable  Post vital signs: Reviewed and stable  Complications: No apparent anesthesia complications

## 2013-06-02 NOTE — Anesthesia Postprocedure Evaluation (Signed)
  Anesthesia Post-op Note  Patient: [name]  Procedure(s) Performed: Procedure(s): DILATATION & CURETTAGE/HYSTEROSCOPY WITH RESECTOCOPE (N/A)  Patient Location: PACU  Anesthesia Type:General  Level of Consciousness: awake, alert  and oriented  Airway and Oxygen Therapy: Patient Spontanous Breathing  Post-op Pain: mild  Post-op Assessment: Post-op Vital signs reviewed, Patient's Cardiovascular Status Stable, Respiratory Function Stable, Patent Airway, No signs of Nausea or vomiting and Pain level controlled  Post-op Vital Signs: Reviewed and stable  Complications: No apparent anesthesia complications

## 2013-06-02 NOTE — Op Note (Signed)
NAMEJANITA, [name] NO.:  [id number]  MEDICAL RECORD NO.:  [id number]  LOCATION:  [facility]                          FACILITY:  [place]  PHYSICIAN:  Lenard Galloway, M.D.   DATE OF BIRTH:  08-23-1955  DATE OF PROCEDURE: DATE OF DISCHARGE:  06/02/2013                              OPERATIVE REPORT   PREOPERATIVE DIAGNOSES:  Postmenopausal bleeding, endometrial polyp.  POSTOPERATIVE DIAGNOSES:  Postmenopausal bleeding, endometrial polyp.  PROCEDURE:  Hysteroscopic polypectomy with dilation and curettage.  SURGEON:  Lenard Galloway, M.D.  ANESTHESIA:  LMA, paracervical block with 10 mL of 1% lidocaine.  IV FLUIDS:  900 mL of Ringer's lactate.  EBL:  Minimal.  URINE OUTPUT:  150 mL.  GLYCINE DEFICIT:  75 mL.  COMPLICATIONS:  None.  INDICATIONS FOR THE PROCEDURE:  The patient is a 58 year old gravida 59, para 70 Caucasian female, who presented with postmenopausal bleeding while on hormone replacement therapy, has Femring and daily Prometrium. The patient had an endometrial biopsy on February 06, 2013, showing an endometrial polyp and she chose to do conservative management of this benign polyp.  The patient continued to have bleeding.  An ultrasound and a sonohysterogram performed on April 23, 2013, documented 16 mm of endometrial thickening and also a 6 mm area of endocervical thickening. Saline ultrasonography documented a 16 x 13 mm filling defect at the uterine fundus and an endocervical filling defect of 6 x 5 mm.  A discussion was held with the patient regarding the pathology and a plan was made to proceed with a hysteroscopic polypectomy with dilation and curettage after risks, benefits, and alternatives were reviewed.  FINDINGS:  Examination under anesthesia revealed a Femring to be in place.  The uterus was small and anteverted.  No adnexal masses were appreciated.  Hysteroscopy demonstrated a broad-based polyp at the uterine fundus attached  posteriorly.  The polyp measured approximately 1.5 x 1.20 cm. There was no evidence of any submucous fibroids, although the posterior wall of the uterine fundus had a slight elevation to it in the midline suggestive of an underlying intramural fibroid.  The tubal ostial region on the right-hand side was visualized well.  The left tubal ostial region was not visualized well due to the polyp.  There was no evidence of any endocervical pathology.  SPECIMENS:  The endometrial polyp was sent to Pathology in pieces. The endometrial curettings were sent to Pathology separately.  PROCEDURE:  The patient was reidentified in the preoperative hold area. The patient received PAS and TED hose for DVT prophylaxis.  In the operating room, the patient was placed in the supine position on the operating room table and an LMA anesthetic was delivered.  The patient was then placed in the dorsal lithotomy position.  The lower abdomen, vagina, and perineum were then sterilely prepped with Betadine and the patient was catheterized of urine with a red rubber catheter. She was then sterilely draped.  The Femring was removed and an exam under anesthesia was performed.  The speculum was placed inside the vagina and a single-tooth tenaculum was placed on the anterior cervical lip.  A paracervical block was performed with a total of 10 mL  of 1% lidocaine.  The uterine sound could not be placed initially to the level of the uterine fundus.  Pratt dilators were used to dilate the cervix, which then allowed the uterine cavity to be sounded to a depth of 8.5 cm.  The cervix was dilated to a #21 Pratt dilator.  The diagnostic hysteroscope was then inserted into the uterine cavity under the continuous infusion of glycine solution. The findings are as noted above.  The diagnostic hysteroscope was removed and the cervix was then further dilated to a #29 Pratt dilator. The resectoscope was then inserted into the uterine  cavity under the continuous infusion of glycine solution.  After adequate visualization was obtained, the monopolar cautery and cutting were used to resect the polyp in pieces and the specimen was sent to Pathology.  The resectoscope was then removed and the sharp and then serrated curettes were used to curette the endometrium in all 4 quadrants.  The tissue was then sent to Pathology as endometrial curettings.  The tenaculum was removed from the anterior cervical lip and the site was bleeding.  This was clamped with a ring forceps, which controlled the bleeding and hemostasis was then good.  All vaginal instruments were removed.  The Femring was rinsed in saline and was placed back in the vagina.  The patient was cleansed of Betadine, awakened, and taken to the recovery room in stable condition.  There were no complications to the procedure.  All needle, instrument, and sponge counts were correct.     Lenard Galloway, M.D.     BES/MEDQ  D:  06/02/2013  T:  06/02/2013  Job:  [id number]

## 2013-06-03 ENCOUNTER — Encounter (HOSPITAL_COMMUNITY): Payer: Self-pay | Admitting: Obstetrics and Gynecology

## 2013-06-04 ENCOUNTER — Encounter: Payer: Self-pay | Admitting: Family Medicine

## 2013-06-18 ENCOUNTER — Encounter: Payer: Self-pay | Admitting: Obstetrics and Gynecology

## 2013-06-18 ENCOUNTER — Ambulatory Visit (INDEPENDENT_AMBULATORY_CARE_PROVIDER_SITE_OTHER): Payer: Managed Care, Other (non HMO) | Admitting: Obstetrics and Gynecology

## 2013-06-18 VITALS — BP 120/74 | HR 70 | Ht 64.75 in | Wt 147.5 lb

## 2013-06-18 DIAGNOSIS — Z9889 Other specified postprocedural states: Secondary | ICD-10-CM

## 2013-06-18 NOTE — Patient Instructions (Addendum)
Please have a discussion with your cardiologist about your hormone replacement therapy in light of your cardiac evaluation.   It is OK to continue with your hormones at this time until you see your cardiologist.

## 2013-06-18 NOTE — Progress Notes (Signed)
Patient ID: [name], female   DOB: Sep 17, 1955, 58 y.o.   MRN: [id number]  Subjective  Patient is here for post op visit.  Is status post hysteroscopy with polypectomy and dilation and curettage on 06/02/13. Had some minor bleeding post op. Using Femring and Prometrium  Undergoing cardiac evaluation due to strong family history of CAD in father, sister, and brother.  Had ECHO done and now needs to see cardiologist in follow up.   Objective  Pelvic   Normal external genitalia and urethra. Cervix and vagina no lesions.  Small and nontender uterus.  Femring in place.  No adnexal masses or tenderness.  Pathology - benign endometrial polyp. [place] - inactive endometrium.  Assessment  Status post hysteroscopic polypectomy with dilation and curettage. HRT patient. Family history of CAD.  Plan  Patient will see cardiologist to discuss ECHO results and discuss whether or not she should continue with HRT. I discussed local estrogen therapy as a possible continued treatment if she stops HRT. Patient has an annual exam in about 3 weeks.

## 2013-06-22 ENCOUNTER — Telehealth: Payer: Self-pay | Admitting: Cardiology

## 2013-06-22 NOTE — Telephone Encounter (Signed)
Pt said she called for her echo results from February and was told it was in  my-chart. She says her results were not  there and she would like to know them please.

## 2013-06-22 NOTE — Telephone Encounter (Signed)
SPOKE TO PATIENT  RESULTS GIVEN . PATIENT QUESTION IF SHE NEED STO START LIPITOR AS SHE AND DR HARDING HAD DISCUSSED IN THE PAST , BUT SHE WOULD LIKE TO HAVE LIPID PANEL REPEATED.  RN INFORMED PATIENT SHE WILL DEFER TO DR HARDING  TO SEE IF SHE NEEDS A LIPID PANEL ONLY OR ANOTHER NMR ASWELL . SHE HAD BOTH IN 07 /2014, ORDERED BY PCP.

## 2013-06-22 NOTE — Telephone Encounter (Signed)
Will defer to S. Hassell Done, RN

## 2013-07-08 LAB — HM PAP SMEAR: HM PAP: NORMAL

## 2013-07-15 ENCOUNTER — Ambulatory Visit: Payer: Self-pay | Admitting: Obstetrics and Gynecology

## 2013-07-15 ENCOUNTER — Encounter: Payer: Self-pay | Admitting: Obstetrics and Gynecology

## 2013-07-15 ENCOUNTER — Ambulatory Visit (INDEPENDENT_AMBULATORY_CARE_PROVIDER_SITE_OTHER): Payer: Managed Care, Other (non HMO) | Admitting: Obstetrics and Gynecology

## 2013-07-15 VITALS — BP 100/62 | HR 70 | Ht 65.0 in | Wt 146.2 lb

## 2013-07-15 DIAGNOSIS — Z01419 Encounter for gynecological examination (general) (routine) without abnormal findings: Secondary | ICD-10-CM

## 2013-07-15 DIAGNOSIS — E785 Hyperlipidemia, unspecified: Secondary | ICD-10-CM

## 2013-07-15 DIAGNOSIS — Z0000 Encounter for general adult medical examination without abnormal findings: Secondary | ICD-10-CM

## 2013-07-15 LAB — POCT URINALYSIS DIPSTICK
Bilirubin, UA: NEGATIVE
GLUCOSE UA: NEGATIVE
Ketones, UA: NEGATIVE
Leukocytes, UA: NEGATIVE
NITRITE UA: NEGATIVE
Protein, UA: NEGATIVE
RBC UA: NEGATIVE
Urobilinogen, UA: NEGATIVE
pH, UA: 5

## 2013-07-15 LAB — LIPID PANEL
Cholesterol: 247 mg/dL — ABNORMAL HIGH (ref 0–200)
HDL: 84 mg/dL (ref >39)
LDL CALC: 149 mg/dL — AB (ref 0–99)
TRIGLYCERIDES: 72 mg/dL (ref <150)
Total CHOL/HDL Ratio: 2.9 Ratio
VLDL: 14 mg/dL (ref 0–40)

## 2013-07-15 LAB — TSH: TSH: 1.689 u[IU]/mL (ref 0.350–4.500)

## 2013-07-15 MED ORDER — PROGESTERONE MICRONIZED 100 MG PO CAPS: 100.0000 mg | ORAL_CAPSULE | Freq: Every day | ORAL | Status: DC

## 2013-07-15 MED ORDER — ESTRADIOL ACETATE 0.05 MG/24HR VA RING: 1.0000 | VAGINAL_RING | VAGINAL | Status: DC

## 2013-07-15 MED ORDER — VITAMIN D (ERGOCALCIFEROL) 1.25 MG (50000 UNIT) PO CAPS: ORAL_CAPSULE | ORAL | Status: DC

## 2013-07-15 NOTE — Patient Instructions (Signed)

## 2013-07-15 NOTE — Progress Notes (Signed)
Patient ID: [name], female   DOB: 1955/05/14, 58 y.o.   MRN: [id number] GYNECOLOGY VISIT  PCP:  Ria Bush, MD  Referring provider:   HPI: 58 y.o.   Married  Caucasian  female   G3P3 with Patient's last menstrual period was 12/15/2011.   here for   AEX. Status post hysteroscopy with dilation and curettage and benign polypectomy. No further postmenopausal bleeding.   Wants blood work. Ran out of Vitamin D one month ago.  Takes twice a month.  Vit D was 31 last summer.   Has not started on Lipitor yet. Waiting to hear from cardiology about continuing or not on HRT.  Due to switch out Femring on May 1.  No hot flashes currently. Still has some night sweats and sleeps with a fan.  None in the last month.   Hgb:   12.8 with PCP on 05-26-13 Urine:  Neg  GYNECOLOGIC HISTORY: Patient's last menstrual period was 12/15/2011. Sexually active:  yes Partner preference: female Contraception:  postmenopausal  Menopausal hormone therapy: Femring  DES exposure: no   Blood transfusions:   no Sexually transmitted diseases:  no GYN procedures and prior surgeries:  D & C, Tubal Ligation, C-section x2, Hx of cryotherapy to cervix 35 years ago. Last mammogram:  04-30-13 VHQ:IONGE               Last pap and high risk HPV testing:  06-30-12 wnl:neg HR HPV.  History of abnormal pap smear:  35 years ago had abnormal pap, colposcopy/cryotherapy to cervix.  Paps have been normal since.   OB History   Grav Para Term Preterm Abortions TAB SAB Ect Mult Living   3 3        3        LIFESTYLE: Exercise:  walking             Tobacco:  no Alcohol:   4 glasses of wine per week Drug use:  no  OTHER HEALTH MAINTENANCE: Tetanus/TDap:  2012 Gardisil:              n/a Influenza:             01/2013 Zostavax:             n/a  Bone density:       n/a Colonoscopy:       2009 with Dr. Collene Mares and had benign polyps(?hyperplastic)--per pt. Doesn't need another colonoscopy for 10 years.  Cholesterol  check:  Elevated with PCP  Family History  Problem Relation Age of Onset  . Coronary artery disease Father 69    MI?  . Other Mother 55    pulm aneurysm  . Stroke Maternal Grandmother   . Hypertension Sister   . Coronary artery disease Brother 7    s/p CABG  . Heart disease Sister 63    CHF, deceased  . Cancer Neg Hx   . Diabetes Neg Hx     Patient Active Problem List   Diagnosis Date Noted  . DOE (dyspnea on exertion) 05/02/2013  . Postmenopausal bleeding 02/07/2013  . Routine health maintenance 01/14/2013  . Family history of early CAD 01/14/2013  . Dyspnea 01/14/2013  . Vitamin D deficiency   . HLD (hyperlipidemia)   . Insomnia 11/21/2011   Past Medical History  Diagnosis Date  . History of chicken pox   . Heart murmur     PVCs thought from MVP -- reportedly had echocardiogram in 2005 at [facility]  . Anxiety   .  History of colon polyps   . Diverticulosis     by colonosocpy  . Vitamin D deficiency   . HLD (hyperlipidemia)     mild  . CAD (coronary artery disease)     currently see cardiologist suspects early stages of CAD  . Malignant melanoma 1988    L thigh, stage 3, yearly skin checks with Dr .Martinique at [facility]    Past Surgical History  Procedure Laterality Date  . Mandible surgery  1987  . Cesarean section  1982 and 1989  . Tubal ligation  1998  . Skin surgery  1988    melanoma excision  . Colonoscopy  2008    small hyperplastic polyps, small int hem, early diverticula ([place]) rpt 10 yrs  . Dilation and curettage of uterus  1999  . Cardiovascular stress test (cpet)  2015    Low Risk Study, but evidence of early Myocardial Dysfunction  . Dilatation & currettage/hysteroscopy with resectocope N/A 06/02/2013    Procedure: DILATATION & CURETTAGE/HYSTEROSCOPY WITH RESECTOCOPE;  Surgeon: Jamey Reas de Berton Lan, MD;  Location: [facility];  Service: Gynecology;  Laterality: N/A;  . US echocardiography  05/2013    WNL, likely  microvascular disease    ALLERGIES: Review of patient's allergies indicates no known allergies.  Current Outpatient Prescriptions  Medication Sig Dispense Refill  . Ascorbic Acid (VITAMIN C) 1000 MG tablet Take by mouth daily.       . Estradiol Acetate (FEMRING) 0.1 MG/24HR RING Place 1 each vaginally every 3 (three) months.  1 each  0  . NON FORMULARY Mitomax; Alpha CRS; Microplex VM; xEO Mega; vEO Mega (all Essential Oil therapy)      . progesterone (PROMETRIUM) 100 MG capsule Take 1 capsule (100 mg total) by mouth daily.  90 capsule  0  . Vitamin D, Ergocalciferol, (DRISDOL) 50000 UNITS CAPS TAKE 1 CAPSULE EVERY OTHER WEEK  6 capsule  0   No current facility-administered medications for this visit.     ROS:  Pertinent items are noted in HPI.  SOCIAL HISTORY:  Married.  Works for [employer].  Going to [employer].   PHYSICAL EXAMINATION:    BP 100/62  Pulse 70  Ht 5\' 5"  (1.651 m)  Wt 146 lb 3.2 oz (66.316 kg)  BMI 24.33 kg/m2  LMP 12/15/2011   Wt Readings from Last 3 Encounters:  07/15/13 146 lb 3.2 oz (66.316 kg)  06/18/13 147 lb 8 oz (66.906 kg)  05/26/13 150 lb (68.04 kg)     Ht Readings from Last 3 Encounters:  07/15/13 5\' 5"  (1.651 m)  06/18/13 5' 4.75" (1.645 m)  05/26/13 5\' 6"  (1.676 m)    General appearance: alert, cooperative and appears stated age Head: Normocephalic, without obvious abnormality, atraumatic Neck: no adenopathy, supple, symmetrical, trachea midline and thyroid not enlarged, symmetric, no tenderness/mass/nodules Lungs: clear to auscultation bilaterally Breasts: Inspection negative, No nipple retraction or dimpling, No nipple discharge or bleeding, No axillary or supraclavicular adenopathy, Normal to palpation without dominant masses Heart: regular rate and rhythm Abdomen: soft, non-tender; no masses,  no organomegaly Extremities: extremities normal, atraumatic, no cyanosis or edema Skin: Skin color, texture, turgor normal. No rashes or  lesions Lymph nodes: Cervical, supraclavicular, and axillary nodes normal. No abnormal inguinal nodes palpated Neurologic: Grossly normal  Pelvic: External genitalia:  no lesions              Urethra:  normal appearing urethra with no masses, tenderness or lesions  Bartholins and Skenes: normal                 Vagina: normal appearing vagina with normal color and discharge, no lesions              Cervix: normal appearance              Pap and high risk HPV testing done: no.            Bimanual Exam:  Uterus:  uterus is normal size, shape, consistency and nontender                                      Adnexa: normal adnexa in size, nontender and no masses                                      Rectovaginal: Confirms                                      Anus:  normal sphincter tone, no lesions  ASSESSMENT  Normal gynecologic exam. Strong family history of CAD.  Patient with early signs of CAD by recent cardiology evaluation.  HRT patient.   Goal is to wean off.   Vit D deficiency.   PLAN  Mammogram recommended yearly.  Pap smear and high risk HPV testing next year.  Will lower Femring dosage and continue with Femring and Prometrium for 6 months and then stop in the Fall 2015. (I did refill for one year in case patient cannot wean off.) Patient will also discuss HRT further with cardiology.  We discussed [employer], and Effexor as alternatives.  Refill Vit D.  Counseled on self breast exam, Calcium and vitamin D intake, exercise. Will check lipids and TSH. Return annually or prn.   An After Visit Summary was printed and given to the patient.

## 2013-08-24 ENCOUNTER — Telehealth: Payer: Self-pay | Admitting: Family Medicine

## 2013-08-24 ENCOUNTER — Ambulatory Visit (INDEPENDENT_AMBULATORY_CARE_PROVIDER_SITE_OTHER): Payer: Managed Care, Other (non HMO) | Admitting: Internal Medicine

## 2013-08-24 ENCOUNTER — Encounter: Payer: Self-pay | Admitting: Internal Medicine

## 2013-08-24 VITALS — BP 122/66 | HR 67 | Temp 97.9°F | Wt 148.0 lb

## 2013-08-24 DIAGNOSIS — R5383 Other fatigue: Secondary | ICD-10-CM

## 2013-08-24 DIAGNOSIS — R52 Pain, unspecified: Secondary | ICD-10-CM

## 2013-08-24 DIAGNOSIS — R5381 Other malaise: Secondary | ICD-10-CM

## 2013-08-24 NOTE — Telephone Encounter (Signed)
Evaluated today.

## 2013-08-24 NOTE — Telephone Encounter (Signed)
Patient Information:  Caller Name: [name]  Phone: [phone]  Patient: [name]  Gender: Female  DOB: 04-02-1956  Age: 59 Years  PCP: Philemon Kingdom (Endocrinology at [facility] office)  Office Follow Up:  Does the office need to follow up with this patient?: No  Instructions For The Office: N/A   Symptoms  Reason For Call & Symptoms: 08/03/13 pt thought she had a small scab on left hip.  08/05/13 scraped scab off and it was actually a tick.  08/19/13 pt has felt more "blah".  08/22/13 low grade temp 99.3 - 99.5 over weekend.   08/24/13 tick bite is just now healing over, only redness right at bite site. Temp 99.3 Tympanic.  Taking Ibuprofen due to feeling achy.  Reviewed Health History In EMR: Yes  Reviewed Medications In EMR: Yes  Reviewed Allergies In EMR: Yes  Reviewed Surgeries / Procedures: Yes  Date of Onset of Symptoms: 08/03/2013  Treatments Tried: Ibuprofen  Treatments Tried Worked: No  Guideline(s) Used:  Tick Bite  Disposition Per Guideline:   Go to Office Now  Reason For Disposition Reached:   Fever or severe headache occurs, 2 to 14 days following the bite  Advice Given:  N/A  Patient Will Follow Care Advice:  YES  Appointment Scheduled:  08/24/2013 15:45:00 Appointment Scheduled Provider:  Webb Silversmith
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
## 2013-08-24 NOTE — Patient Instructions (Addendum)
Tick Bite Information Ticks are insects that attach themselves to the skin and draw blood for food. There are various types of ticks. Common types include wood ticks and deer ticks. Most ticks live in shrubs and grassy areas. Ticks can climb onto your body when you make contact with leaves or grass where the tick is waiting. The most common places on the body for ticks to attach themselves are the scalp, neck, armpits, waist, and groin. Most tick bites are harmless, but sometimes ticks carry germs that cause diseases. These germs can be spread to a person during the tick's feeding process. The chance of a disease spreading through a tick bite depends on:   The type of tick.  Time of year.   How long the tick is attached.   Geographic location.  HOW CAN YOU PREVENT TICK BITES? Take these steps to help prevent tick bites when you are outdoors:  Wear protective clothing. Long sleeves and long pants are best.   Wear white clothes so you can see ticks more easily.  Tuck your pant legs into your socks.   If walking on a trail, stay in the middle of the trail to avoid brushing against bushes.  Avoid walking through areas with long grass.  Put insect repellent on all exposed skin and along boot tops, pant legs, and sleeve cuffs.   Check clothing, hair, and skin repeatedly and before going inside.   Brush off any ticks that are not attached.  Take a shower or bath as soon as possible after being outdoors.  WHAT IS THE PROPER WAY TO REMOVE A TICK? Ticks should be removed as soon as possible to help prevent diseases caused by tick bites. 1. If latex gloves are available, put them on before trying to remove a tick.  2. Using fine-point tweezers, grasp the tick as close to the skin as possible. You may also use curved forceps or a tick removal tool. Grasp the tick as close to its head as possible. Avoid grasping the tick on its body. 3. Pull gently with steady upward pressure until  the tick lets go. Do not twist the tick or jerk it suddenly. This may break off the tick's head or mouth parts. 4. Do not squeeze or crush the tick's body. This could force disease-carrying fluids from the tick into your body.  5. After the tick is removed, wash the bite area and your hands with soap and water or other disinfectant such as alcohol. 6. Apply a small amount of antiseptic cream or ointment to the bite site.  7. Wash and disinfect any instruments that were used.  Do not try to remove a tick by applying a hot match, petroleum jelly, or fingernail polish to the tick. These methods do not work and may increase the chances of disease being spread from the tick bite.  WHEN SHOULD YOU SEEK MEDICAL CARE? Contact your health care provider if you are unable to remove a tick from your skin or if a part of the tick breaks off and is stuck in the skin.  After a tick bite, you need to be aware of signs and symptoms that could be related to diseases spread by ticks. Contact your health care provider if you develop any of the following in the days or weeks after the tick bite:  Unexplained fever.  Rash. A circular rash that appears days or weeks after the tick bite may indicate the possibility of Lyme disease. The rash may resemble   a target with a bull's-eye and may occur at a different part of your body than the tick bite.  Redness and swelling in the area of the tick bite.   Tender, swollen lymph glands.   Diarrhea.   Weight loss.   Cough.   Fatigue.   Muscle, joint, or bone pain.   Abdominal pain.   Headache.   Lethargy or a change in your level of consciousness.  Difficulty walking or moving your legs.   Numbness in the legs.   Paralysis.  Shortness of breath.   Confusion.   Repeated vomiting.  Document Released: 03/23/2000 Document Revised: 01/14/2013 Document Reviewed: 09/03/2012 [employer] Patient Information 2014 [employer].  

## 2013-08-24 NOTE — Progress Notes (Signed)
Pre visit review using our clinic review tool, if applicable. No additional management support is needed unless otherwise documented below in the visit note. 

## 2013-08-25 LAB — CBC
HEMATOCRIT: 38.9 % (ref 36.0–46.0)
Hemoglobin: 13 g/dL (ref 12.0–15.0)
MCHC: 33.4 g/dL (ref 30.0–36.0)
MCV: 95.4 fl (ref 78.0–100.0)
Platelets: 260 10*3/uL (ref 150.0–400.0)
RBC: 4.07 Mil/uL (ref 3.87–5.11)
RDW: 12.9 % (ref 11.5–15.5)
WBC: 6.8 10*3/uL (ref 4.0–10.5)

## 2013-08-25 LAB — COMPREHENSIVE METABOLIC PANEL
ALBUMIN: 4.1 g/dL (ref 3.5–5.2)
ALK PHOS: 41 U/L (ref 39–117)
ALT: 14 U/L (ref 0–35)
AST: 17 U/L (ref 0–37)
BILIRUBIN TOTAL: 0.5 mg/dL (ref 0.2–1.2)
BUN: 17 mg/dL (ref 6–23)
CO2: 27 mEq/L (ref 19–32)
CREATININE: 0.9 mg/dL (ref 0.4–1.2)
Calcium: 8.8 mg/dL (ref 8.4–10.5)
Chloride: 104 mEq/L (ref 96–112)
GFR: 72.34 mL/min (ref >60.00)
Glucose, Bld: 86 mg/dL (ref 70–99)
POTASSIUM: 4 meq/L (ref 3.5–5.1)
Sodium: 139 mEq/L (ref 135–145)
Total Protein: 6.3 g/dL (ref 6.0–8.3)

## 2013-08-25 LAB — TSH: TSH: 0.77 u[IU]/mL (ref 0.35–4.50)

## 2013-12-02 ENCOUNTER — Telehealth: Payer: Self-pay | Admitting: Family Medicine

## 2013-12-02 MED ORDER — SCOPOLAMINE 1 MG/3DAYS TD PT72: 1.0000 | MEDICATED_PATCH | TRANSDERMAL | Status: DC

## 2013-12-02 NOTE — Telephone Encounter (Signed)
Message left notifying patient.

## 2013-12-02 NOTE — Telephone Encounter (Signed)
Sent in scopalamine patch. Sedation precautions. If too expensive let us know to try tablet.

## 2013-12-02 NOTE — Telephone Encounter (Signed)
Pt called stating she is going on cruise Friday and wanted to know if dr g would give her something for motion sickness [facility] on [address]

## 2014-02-08 ENCOUNTER — Encounter: Payer: Self-pay | Admitting: Internal Medicine

## 2014-03-16 ENCOUNTER — Ambulatory Visit (INDEPENDENT_AMBULATORY_CARE_PROVIDER_SITE_OTHER): Payer: Managed Care, Other (non HMO) | Admitting: Family Medicine

## 2014-03-16 ENCOUNTER — Encounter: Payer: Self-pay | Admitting: Family Medicine

## 2014-03-16 VITALS — BP 130/80 | HR 72 | Temp 98.0°F | Ht 65.0 in | Wt 149.5 lb

## 2014-03-16 DIAGNOSIS — E559 Vitamin D deficiency, unspecified: Secondary | ICD-10-CM

## 2014-03-16 DIAGNOSIS — Z23 Encounter for immunization: Secondary | ICD-10-CM

## 2014-03-16 DIAGNOSIS — I519 Heart disease, unspecified: Secondary | ICD-10-CM

## 2014-03-16 DIAGNOSIS — E785 Hyperlipidemia, unspecified: Secondary | ICD-10-CM

## 2014-03-16 DIAGNOSIS — I2510 Atherosclerotic heart disease of native coronary artery without angina pectoris: Secondary | ICD-10-CM | POA: Insufficient documentation

## 2014-03-16 DIAGNOSIS — Z0000 Encounter for general adult medical examination without abnormal findings: Secondary | ICD-10-CM

## 2014-03-16 DIAGNOSIS — M791 Myalgia, unspecified site: Secondary | ICD-10-CM

## 2014-03-16 NOTE — Assessment & Plan Note (Signed)
Continues 50,000 IU every other week Check level at [facility] at her convenience.

## 2014-03-16 NOTE — Progress Notes (Signed)
BP 130/80 mmHg  Pulse 72  Temp(Src) 98 F (36.7 C) (Oral)  Ht 5\' 5"  (1.651 m)  Wt 149 lb 8 oz (67.813 kg)  BMI 24.88 kg/m2  LMP 12/15/2011   CC: CPE  Subjective:    Patient ID: [name], female    DOB: 11-02-1955, 58 y.o.   MRN: [id number]  HPI: [name] is a 58 y.o. female presenting on 03/16/2014 for Annual Exam   Early CAD - established with Dr [name]. lipitor was started 04/2013. Around March started noticing muscle aches of upper arms and left leg. Now also with L hip pain.  Strong fmhx CAD - father age 2, brother age 64, sister with CHF age 38 deceased.  Occasional dyspnea at rest and with exertion. No chest pain/tightness with this.   Preventative: Well woman with OBGYN Dr. Josefa Half - April 2015. normal pap smear. On low dose estrogen for hot flashes. COLONOSCOPY Date: 2008 small hyperplastic polyps, small int hem, early diverticula Collene Mares) rpt 10 yrs Mammogram - 04/2013 WNL, [facility] Flu shot today. Tdap - 2012 Seat belt [place] discussed Sunscreen use discussed  Caffeine: 2 cups coffee/day  Lives with husband, 2 dogs.  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 Interim medical history since our last visit reviewed. Allergies and medications reviewed and updated.  Current Outpatient Prescriptions on File Prior to Visit  Medication Sig  . Ascorbic Acid (VITAMIN C) 1000 MG tablet Take by mouth daily.   Marland Kitchen atorvastatin (LIPITOR) 10 MG tablet Take 10 mg by mouth every other day. 1/2 tablet  . Estradiol Acetate 0.05 MG/24HR RING Place 1 each vaginally every 3 (three) months.  . NON FORMULARY Mitomax; Alpha CRS; Microplex VM; xEO Mega; vEO Mega (all Essential Oil therapy)  . progesterone (PROMETRIUM) 100 MG capsule Take 1 capsule (100 mg total) by mouth  daily.  . Vitamin D, Ergocalciferol, (DRISDOL) 50000 UNITS CAPS capsule TAKE 1 CAPSULE EVERY OTHER WEEK   No current facility-administered medications on file prior to visit.    Review of Systems  Constitutional: Negative for fever, chills, activity change, appetite change, fatigue and unexpected weight change.  HENT: Negative for hearing loss.   Eyes: Negative for visual disturbance.  Respiratory: Negative for cough, chest tightness, shortness of breath and wheezing.   Cardiovascular: Negative for chest pain, palpitations and leg swelling.  Gastrointestinal: Negative for nausea, vomiting, abdominal pain, diarrhea, constipation, blood in stool and abdominal distention.  Genitourinary: Negative for hematuria and difficulty urinating.  Musculoskeletal: Negative for myalgias, arthralgias and neck pain.  Skin: Negative for rash.  Neurological: Negative for dizziness, seizures, syncope and headaches.  Hematological: Negative for adenopathy. Does not bruise/bleed easily.  Psychiatric/Behavioral: Negative for dysphoric mood. The patient is not nervous/anxious.    Per HPI unless specifically indicated above     Objective:    BP 130/80 mmHg  Pulse 72  Temp(Src) 98 F (36.7 C) (Oral)  Ht 5\' 5"  (1.651 m)  Wt 149 lb 8 oz (67.813 kg)  BMI 24.88 kg/m2  LMP 12/15/2011  Wt Readings from Last 3 Encounters:  03/16/14 149 lb 8 oz (67.813 kg)  08/24/13 148 lb (67.132 kg)  07/15/13 146 lb 3.2 oz (66.316 kg)    Physical Exam  Constitutional: She is oriented to person, place, and time. She appears well-developed and  well-nourished. No distress.  HENT:  Head: Normocephalic and atraumatic.  Right Ear: Hearing, tympanic membrane, external ear and ear canal normal.  Left Ear: Hearing, tympanic membrane, external ear and ear canal normal.  Nose: Nose normal.  Mouth/Throat: Uvula is midline, oropharynx is clear and moist and mucous membranes are normal. No oropharyngeal exudate, posterior  oropharyngeal edema or posterior oropharyngeal erythema.  Eyes: Conjunctivae and EOM are normal. Pupils are equal, round, and reactive to light. No scleral icterus.  Neck: Normal range of motion. Neck supple. No thyromegaly present.  Cardiovascular: Normal rate, regular rhythm, normal heart sounds and intact distal pulses.   No murmur heard. Pulses:      Radial pulses are 2+ on the right side, and 2+ on the left side.  Pulmonary/Chest: Effort normal and breath sounds normal. No respiratory distress. She has no wheezes. She has no rales.  Abdominal: Soft. Bowel sounds are normal. She exhibits no distension and no mass. There is no tenderness. There is no rebound and no guarding.  Musculoskeletal: Normal range of motion. She exhibits no edema.  Lymphadenopathy:    She has no cervical adenopathy.  Neurological: She is alert and oriented to person, place, and time.  CN grossly intact, station and gait intact  Skin: Skin is warm and dry. No rash noted.  Psychiatric: She has a normal mood and affect. Her behavior is normal. Judgment and thought content normal.  Nursing note and vitals reviewed.  Results for orders placed or performed in visit on 08/24/13  CBC  Result Value Ref Range   WBC 6.8 4.0 - 10.5 K/uL   RBC 4.07 3.87 - 5.11 Mil/uL   Platelets 260.0 150.0 - 400.0 K/uL   Hemoglobin 13.0 12.0 - 15.0 g/dL   HCT 38.9 36.0 - 46.0 %   MCV 95.4 78.0 - 100.0 fl   MCHC 33.4 30.0 - 36.0 g/dL   RDW 12.9 11.5 - 15.5 %  Comprehensive metabolic panel  Result Value Ref Range   Sodium 139 135 - 145 mEq/L   Potassium 4.0 3.5 - 5.1 mEq/L   Chloride 104 96 - 112 mEq/L   CO2 27 19 - 32 mEq/L   Glucose, Bld 86 70 - 99 mg/dL   BUN 17 6 - 23 mg/dL   Creatinine, Ser 0.9 0.4 - 1.2 mg/dL   Total Bilirubin 0.5 0.2 - 1.2 mg/dL   Alkaline Phosphatase 41 39 - 117 U/L   AST 17 0 - 37 U/L   ALT 14 0 - 35 U/L   Total Protein 6.3 6.0 - 8.3 g/dL   Albumin 4.1 3.5 - 5.2 g/dL   Calcium 8.8 8.4 - 10.5 mg/dL    GFR 72.34 >60.00 mL/min  TSH  Result Value Ref Range   TSH 0.77 0.35 - 4.50 uIU/mL      Assessment & Plan:  Not fasting today - pt will get fasting labwork at [facility] (work).  Problem List Items Addressed This Visit    Vitamin D deficiency - Primary    Continues 50,000 IU every other week Check level at [facility] at her convenience.    Routine health maintenance    Preventative protocols reviewed and updated unless pt declined. Discussed healthy diet and lifestyle.    Myalgia    Not consistent with hip arthritis, bursitis.  ?sciatica. Anticipate actually related to statin myopathy - rec trial of CoQ10 60-100mg  daily for next 3-4 wks. If not helpful, consider trial of pravastatin. Check CPK next labwork. Pt agrees with plan.  HLD (hyperlipidemia) (Chronic)    Continues lipitor 5mg  QOD. See below. Discussed less potent statin vs trial off.    Heart disease    Followed by Dr [name]. Discussed statin given stress test results as well as premature fmhx CAD. See below.     Other Visit Diagnoses    Need for influenza vaccination        Relevant Orders       Flu Vaccine QUAD 36+ mos PF IM (Fluarix Quad PF) (Completed)        Follow up plan: Return in about 1 year (around 03/17/2015), or as needed, for annual exam, prior fasting for blood work.

## 2014-03-16 NOTE — Progress Notes (Signed)
Pre visit review using our clinic review tool, if applicable. No additional management support is needed unless otherwise documented below in the visit note. 

## 2014-03-16 NOTE — Patient Instructions (Addendum)
Flu shot today. For muscle aches and left hip pain - I don't think this is arthritis or bursitis. ?sciatica vs lipitor related hip pain. Trial of coenzyme Q 10 once daily for next 2-3 weeks. If not better, we could consider pravastatin (less potent statin medicine).  Get fasting labwork at your convenience at [facility]. Good to see you today, call us with questions

## 2014-03-16 NOTE — Assessment & Plan Note (Signed)
Continues lipitor 5mg  QOD. See below. Discussed less potent statin vs trial off.

## 2014-03-16 NOTE — Assessment & Plan Note (Signed)
Not consistent with hip arthritis, bursitis.  ?sciatica. Anticipate actually related to statin myopathy - rec trial of CoQ10 60-100mg  daily for next 3-4 wks. If not helpful, consider trial of pravastatin. Check CPK next labwork. Pt agrees with plan.

## 2014-03-16 NOTE — Assessment & Plan Note (Signed)
Preventative protocols reviewed and updated unless pt declined. Discussed healthy diet and lifestyle.  

## 2014-03-16 NOTE — Assessment & Plan Note (Signed)
Followed by Dr [name]. Discussed statin given stress test results as well as premature fmhx CAD. See below.

## 2014-03-17 ENCOUNTER — Other Ambulatory Visit (INDEPENDENT_AMBULATORY_CARE_PROVIDER_SITE_OTHER): Payer: Managed Care, Other (non HMO)

## 2014-03-17 DIAGNOSIS — E785 Hyperlipidemia, unspecified: Secondary | ICD-10-CM

## 2014-03-17 DIAGNOSIS — M791 Myalgia, unspecified site: Secondary | ICD-10-CM

## 2014-03-17 DIAGNOSIS — E559 Vitamin D deficiency, unspecified: Secondary | ICD-10-CM

## 2014-07-21 ENCOUNTER — Ambulatory Visit (INDEPENDENT_AMBULATORY_CARE_PROVIDER_SITE_OTHER): Payer: 59 | Admitting: Obstetrics and Gynecology

## 2014-07-21 ENCOUNTER — Encounter: Payer: Self-pay | Admitting: Obstetrics and Gynecology

## 2014-07-21 VITALS — BP 130/78 | HR 68 | Resp 16 | Ht 64.75 in | Wt 151.0 lb

## 2014-07-21 DIAGNOSIS — N951 Menopausal and female climacteric states: Secondary | ICD-10-CM

## 2014-07-21 DIAGNOSIS — Z01419 Encounter for gynecological examination (general) (routine) without abnormal findings: Secondary | ICD-10-CM | POA: Diagnosis not present

## 2014-07-21 MED ORDER — ESTRADIOL ACETATE 0.05 MG/24HR VA RING
1.0000 | VAGINAL_RING | VAGINAL | Status: DC
Start: 2014-07-21 — End: 2016-03-22

## 2014-07-21 MED ORDER — PROGESTERONE MICRONIZED 100 MG PO CAPS: 100.0000 mg | ORAL_CAPSULE | Freq: Every day | ORAL | Status: DC

## 2014-07-21 MED ORDER — ZOLPIDEM TARTRATE 10 MG PO TABS: ORAL_TABLET | ORAL | Status: DC

## 2014-07-21 NOTE — Progress Notes (Signed)
59 y.o. G2P2 MarriedCaucasianF here for annual exam.   Wants a pap today.   Having hip pain that wakes her up at night.  Started CoQ10 and no improvement in her pain.  Stopped her Lipitor.  Will be seeing Cardiology soon.  Has visits once a year.  Pain is in the bone.  No skin lesions.  Having arthritis pains.  No improvement with ibuprofen.  Will see PCP again.   Not sleeping well. Some hot flashes still at night.  On Femring and Prometrium. OTC sleep aid - patient had wakeful effect.  Used Ambien in the past, and it worked well. Melatonin kept patient awake.   3 grandchildren.  Going to the beach this summer with whole family.   Patient's last menstrual period was 12/15/2011.          Sexually active: Yes.    The current method of family planning is tubal ligation and post menopausal status.    Exercising: Yes.    2 miles 3 x weekly Smoker:  no  Health Maintenance: Pap: 06/2012 Neg. HR HPV:Neg History of abnormal Pap:  no MMG: 04/30/13 BIRADS2:Benign  Self Breast Exam: yes, once a month Colonoscopy: 2008 - polyps. Every 10 years  BMD:   2008 TDaP:  2012 Screening Labs: PCP, Hb today: PCP, Urine today: PCP   reports that she quit smoking about 22 years ago. She has never used smokeless tobacco. She reports that she drinks about 2.0 oz of alcohol per week. She reports that she does not use illicit drugs.  Past Medical History  Diagnosis Date  . History of chicken pox   . Heart murmur     PVCs thought from MVP -- reportedly had echocardiogram in 2005 at [facility]  . Anxiety   . History of colon polyps   . Diverticulosis     by colonosocpy  . Vitamin D deficiency   . HLD (hyperlipidemia)     mild  . CAD (coronary artery disease)     currently see cardiologist suspects early stages of CAD  . Malignant melanoma 1988    L thigh, stage 3, yearly skin checks with Dr .Martinique at [facility]    Past Surgical History  Procedure Laterality Date  . Mandible  surgery  1987  . Cesarean section  1982 and 1989  . Tubal ligation  1998  . Skin surgery  1988    melanoma excision  . Colonoscopy  2008    small hyperplastic polyps, small int hem, early diverticula (Mann) rpt 10 yrs  . Dilation and curettage of uterus  1999  . Cardiovascular stress test (cpet)  2015    Low Risk Study, but evidence of early Myocardial Dysfunction  . Dilatation & currettage/hysteroscopy with resectocope N/A 06/02/2013    Procedure: DILATATION & CURETTAGE/HYSTEROSCOPY WITH RESECTOCOPE;  Surgeon: Jamey Reas de Berton Lan, MD;  Location: [facility];  Service: Gynecology;  Laterality: N/A;  . US echocardiography  05/2013    WNL, likely microvascular disease    Current Outpatient Prescriptions  Medication Sig Dispense Refill  . Ascorbic Acid (VITAMIN C) 1000 MG tablet Take by mouth daily.     . Coenzyme Q10 (CO Q 10 PO) Take by mouth daily.    . Estradiol Acetate 0.05 MG/24HR RING Place 1 each vaginally every 3 (three) months. 1 each 3  . NON FORMULARY Mitomax; Alpha CRS; Microplex VM; xEO Mega; vEO Mega (all Essential Oil therapy)    . Omega-3 Fatty Acids (FISH  OIL) 1000 MG CAPS Take by mouth daily.    . progesterone (PROMETRIUM) 100 MG capsule Take 1 capsule (100 mg total) by mouth daily. 90 capsule 3  . Vitamin D, Ergocalciferol, (DRISDOL) 50000 UNITS CAPS capsule TAKE 1 CAPSULE EVERY OTHER WEEK 6 capsule 3   No current facility-administered medications for this visit.    Family History  Problem Relation Age of Onset  . Coronary artery disease Father 57    MI?  . Other Mother 39    pulm aneurysm  . Stroke Maternal Grandmother   . Hypertension Sister   . Coronary artery disease Brother 83    s/p CABG  . Heart disease Sister 24    CHF, deceased  . Cancer Neg Hx   . Diabetes Neg Hx     ROS:  Pertinent items are noted in HPI.  Otherwise, a comprehensive ROS was negative.  Exam:   BP 130/78 mmHg  Pulse 68  Resp 16  Ht 5' 4.75" (1.645 m)  Wt 151 lb  (68.493 kg)  BMI 25.31 kg/m2  LMP 12/15/2011     Height: 5' 4.75" (164.5 cm)  Ht Readings from Last 3 Encounters:  07/21/14 5' 4.75" (1.645 m)  03/16/14 5\' 5"  (1.651 m)  07/15/13 5\' 5"  (1.651 m)    General appearance: alert, cooperative and appears stated age Head: Normocephalic, without obvious abnormality, atraumatic Neck: no adenopathy, supple, symmetrical, trachea midline and thyroid normal to inspection and palpation Lungs: clear to auscultation bilaterally Breasts: normal appearance, no masses or tenderness, Inspection negative, No nipple retraction or dimpling, No nipple discharge or bleeding, No axillary or supraclavicular adenopathy Heart: regular rate and rhythm Abdomen: soft, non-tender; bowel sounds normal; no masses,  no organomegaly Extremities: extremities normal, atraumatic, no cyanosis or edema Skin: Skin color, texture, turgor normal. No rashes or lesions Lymph nodes: Cervical, supraclavicular, and axillary nodes normal. No abnormal inguinal nodes palpated Neurologic: Grossly normal   Pelvic: External genitalia:  no lesions              Urethra:  normal appearing urethra with no masses, tenderness or lesions              Bartholins and Skenes: normal                 Vagina: normal appearing vagina with normal color and discharge, no lesions              Cervix: no lesions              Pap taken: Yes.   Bimanual Exam:  Uterus:  normal size, contour, position, consistency, mobility, non-tender              Adnexa: normal adnexa and no mass, fullness, tenderness               Rectovaginal: Confirms               Anus:  normal sphincter tone, no lesions  Chaperone was present for exam.  A:  Well Woman with normal exam Menopausal symptoms with some insomnia.  HRT patient.  Joint pain.   P:   Mammogram yearly.  Just done.  pap smear and HR HPV testing.  Refill on Femring and Prometrium for one year.  Is seeing cardiology soon for yearly re-evaluation of  cardiovascular health. Ambien 10 mg, 1/2 tab at hs prn.  #30, RF one.  Follow up with PCP regarding joint pain.  return annually or prn

## 2014-07-21 NOTE — Patient Instructions (Signed)

## 2014-07-23 LAB — IPS PAP TEST WITH HPV

## 2014-08-08 HISTORY — PX: CARDIOVASCULAR STRESS TEST: SHX262

## 2014-08-16 ENCOUNTER — Ambulatory Visit (INDEPENDENT_AMBULATORY_CARE_PROVIDER_SITE_OTHER): Payer: 59 | Admitting: Cardiology

## 2014-08-16 ENCOUNTER — Encounter: Payer: Self-pay | Admitting: Cardiology

## 2014-08-16 VITALS — BP 124/80 | HR 72 | Ht 65.5 in | Wt 152.9 lb

## 2014-08-16 DIAGNOSIS — R0600 Dyspnea, unspecified: Secondary | ICD-10-CM

## 2014-08-16 DIAGNOSIS — E785 Hyperlipidemia, unspecified: Secondary | ICD-10-CM | POA: Diagnosis not present

## 2014-08-16 DIAGNOSIS — R0609 Other forms of dyspnea: Secondary | ICD-10-CM

## 2014-08-16 DIAGNOSIS — R0789 Other chest pain: Secondary | ICD-10-CM

## 2014-08-16 MED ORDER — PRAVASTATIN SODIUM 20 MG PO TABS: 20.0000 mg | ORAL_TABLET | Freq: Every evening | ORAL | Status: DC

## 2014-08-16 NOTE — Patient Instructions (Signed)
Start pravastatin (pravachol) 20 mg 1 tablet at bedtime    advanced Lipid panel with inflammation to be done   Your physician has requested that you have an exercise tolerance test. For further information please visit [email or web address]. Please also follow instruction sheet, as given.  Your physician wants you to follow-up in 2 month DR HARDIN 30 MONTHS APPOINTMENT. You will receive a reminder letter in the mail two months in advance. If you don't receive a letter, please call our office to schedule the follow-up appointment.

## 2014-08-16 NOTE — Progress Notes (Signed)
PCP: Ria Bush, MD  Clinic Note: Chief Complaint  Patient presents with  . Annual Exam    patient reports a couple episodes of chest pain, left hip pain, dizziness-if hse gets up too quickly.    HPI: [name] is a 59 y.o. female with a PMH below who presents today for a delayed/1-1/2 year followup for followup.  She is the wife of another patient of mine l [place]. She is reluctant to be pleasant woman who is as active as she can be admitted walking 2 miles a day. She's had a relatively significant cardiac evaluation. Cardiopulmonary exercise test that suggested possible microvascular ischemia. She had an echocardiogram performed which was relatively normal. The thought processes that her symptoms are probably microvascular ischemia in nature. Risk factor modification is suggested. She had been on statin but is no longer on 1.  Past Medical History  Diagnosis Date  . History of chicken pox   . Heart murmur     PVCs thought from MVP -- reportedly had echocardiogram in 2005 at [facility]; follow-up Echo February 2015: Normal LV size and function. EF 60-65% with no RWMA, no aortic stenosis, trivial MR with no suggestion of prolapse,  . Anxiety   . History of colon polyps   . Diverticulosis     by colonosocpy  . Vitamin D deficiency   . HLD (hyperlipidemia)     mild  . CAD (coronary artery disease)     CPET Test suggests mild potential ischemic findings - likely Microvascular; Normal LV function on Echo, Overall Good score of CPET  . Malignant melanoma 1988    L thigh, stage 3, yearly skin checks with Dr .Martinique at [facility]    Prior Cardiac Evaluation and Past Surgical History: Past Surgical History  Procedure Laterality Date  . Mandible surgery  1987  . Cesarean section  1982 and 1989  . Tubal ligation  1998  . Skin surgery  1988    melanoma excision  . Colonoscopy  2008    small hyperplastic polyps, small int hem, early diverticula (Mann) rpt 10  yrs  . Dilation and curettage of uterus  1999  . Cardiovascular stress test (cpet)  2015    Low Risk Study, but evidence of early Myocardial Dysfunction  . Dilatation & currettage/hysteroscopy with resectocope N/A 06/02/2013    Procedure: DILATATION & CURETTAGE/HYSTEROSCOPY WITH RESECTOCOPE;  Surgeon: Jamey Reas de Berton Lan, MD;  Location: [facility];  Service: Gynecology;  Laterality: N/A;  . US echocardiography  05/2013    WNL, likely microvascular disease    Interval History:  She presents today doing okay. He says he still gets short of breath if he goes up a flight of steps was always been like that. She also occasionally notes some palpitations that last a few seconds. In that sensation in her chest. She also had occasions of feeling a sensation underneath her left breast about 20-30 minutes the left sided chest pain treated this is not necessarily exertional.she is some chest discomfort that is sort of a sharp type stabbing discomfort a few nights ago. She denies any anginal type chest pain with rest or exertion. No PND, orthopnea or edema. No  lightheadedness, dizziness, weakness or syncope/near syncope. No TIA/amaurosis fugax symptoms. No melena, hematochezia, hematuria, or epstaxis. No claudication.  ROS: A comprehensive was performed. Review of Systems  Constitutional: Positive for malaise/fatigue (Slightly reduced exercise tolerance.).  Respiratory: Negative.   Cardiovascular: Positive for palpitations.  Musculoskeletal:       She does have mild arthritis pains in certain areas hips and joints but nothing overly worrisome to her. No myalgias or arthralgias that are severe  Neurological: Negative for headaches.  Psychiatric/Behavioral: The patient is nervous/anxious.   All other systems reviewed and are negative.   Current Outpatient Prescriptions on File Prior to Visit  Medication Sig Dispense Refill  . Ascorbic Acid (VITAMIN C) 1000 MG tablet Take by mouth daily.      . Estradiol Acetate 0.05 MG/24HR RING Place 1 each vaginally every 3 (three) months. 1 each 3  . NON FORMULARY Mitomax; Alpha CRS; Microplex VM; xEO Mega; vEO Mega (all Essential Oil therapy)    . Omega-3 Fatty Acids (FISH OIL) 1000 MG CAPS Take by mouth daily.    . progesterone (PROMETRIUM) 100 MG capsule Take 1 capsule (100 mg total) by mouth daily. 90 capsule 3  . Vitamin D, Ergocalciferol, (DRISDOL) 50000 UNITS CAPS capsule TAKE 1 CAPSULE EVERY OTHER WEEK 6 capsule 3  . zolpidem (AMBIEN) 10 MG tablet Take 1/2 tablet (5 mg) at night if needed for insomnia (Patient not taking: Reported on 08/16/2014) 30 tablet 1   No current facility-administered medications on file prior to visit.   No Known Allergies   History  Substance Use Topics  . Smoking status: Former Smoker    Quit date: 04/09/1992  . Smokeless tobacco: Never Used     Comment: quit 20 years ago  . Alcohol Use: 2.0 oz/week    4 Standard drinks or equivalent per week     Comment: 4 glasses of wine a week   Family History  Problem Relation Age of Onset  . Coronary artery disease Father 30    MI?  . Other Mother 9    pulm aneurysm  . Stroke Maternal Grandmother   . Hypertension Sister   . Coronary artery disease Brother 78    s/p CABG  . Heart disease Sister 44    CHF, deceased  . Cancer Neg Hx   . Diabetes Neg Hx      Wt Readings from Last 3 Encounters:  08/16/14 69.355 kg (152 lb 14.4 oz)  07/21/14 68.493 kg (151 lb)  03/16/14 67.813 kg (149 lb 8 oz)    PHYSICAL EXAM BP 124/80 mmHg  Pulse 72  Ht 5' 5.5" (1.664 m)  Wt 69.355 kg (152 lb 14.4 oz)  BMI 25.05 kg/m2  LMP 12/15/2011 General appearance: alert, cooperative, appears stated age, no distress and Well-nourished and well-groomed. Answers questions appropriately. Normal mood and affect. HEENT: [place]/AT, EOMI, MMM, anicteric sclera Neck: no adenopathy, no carotid bruit, no JVD and supple, symmetrical, trachea midline Lungs: clear to auscultation  bilaterally, normal percussion bilaterally and Nonlabored, good air movement Heart: regular rate and rhythm, S1, S2 normal, no murmur, click, rub or gallop and normal apical impulse Abdomen: soft, non-tender; bowel sounds normal; no masses, no organomegaly Extremities: extremities normal, atraumatic, no cyanosis or edema and no edema, redness or tenderness in the calves or thighs Pulses: 2+ and symmetric Neurologic: Alert and oriented X 3, normal strength and tone. Normal symmetric reflexes. Normal coordination and gait; CN 2-12 grossly intact   Adult ECG Report  Rate: 72 ;  Rhythm: normal sinus rhythm  Narrative Interpretation: normal EKG  Recent Labs:  Monitored by PCP. She has other labs from an outside source: Total cholesterol 209, HDL 99 (??),  and triglycerides 50, LDL 100.   ASSESSMENT / PLAN:   Problem List  Items Addressed This Visit    Atypical chest pain    I do think that some of the discomfort she is feeling a sharp stabbing type discomfort in chest lasting 20 minutes is probably musculoskeletal and noncardiac in nature.  The patient should be safe doing A treadmill stress test.. I would like to be here      Relevant Orders   EKG 12-Lead (Completed)   Exercise Tolerance Test   NMR Lipoprofile with Lipids   DOE (dyspnea on exertion) - Primary (Chronic)    Relatively chronic symptoms. Nothing overly significant.  In the past within the bicycle exercise test. Unfortunately is not available to me now. We do note her echocardiogram of his life. It is not unreasonable to do a GXT in order to assess her exercise tolerance and 1 response of symptoms is.      Relevant Medications   pravastatin (PRAVACHOL) 20 MG tablet   Other Relevant Orders   EKG 12-Lead (Completed)   Exercise Tolerance Test   NMR Lipoprofile with Lipids   HLD (hyperlipidemia) (Chronic)    She had been on a statin. Not any more productive she needs to restart. To avoid any potential side effects I will  have her start pravastatin 20 mg daily. Check the New [facility] NMR type panel - Card IQ - the to see if the number to the correlate.       Relevant Medications   pravastatin (PRAVACHOL) 20 MG tablet   Other Relevant Orders   EKG 12-Lead (Completed)   Exercise Tolerance Test   NMR Lipoprofile with Lipids       Followup: 2 Vaughan Sine, Leonie Green, M.D., M.S. Interventional Cardiologist   Pager # [phone]

## 2014-08-18 ENCOUNTER — Encounter: Payer: Self-pay | Admitting: Cardiology

## 2014-08-18 ENCOUNTER — Telehealth (HOSPITAL_COMMUNITY): Payer: Self-pay

## 2014-08-18 DIAGNOSIS — R0789 Other chest pain: Secondary | ICD-10-CM | POA: Insufficient documentation

## 2014-08-18 NOTE — Assessment & Plan Note (Addendum)
She had been on a statin. Not any more productive she needs to restart. To avoid any potential side effects I will have her start pravastatin 20 mg daily. Check the [facility] NMR type panel - Card IQ - the to see if the number to the correlate.

## 2014-08-18 NOTE — Assessment & Plan Note (Addendum)
Relatively chronic symptoms. Nothing overly significant.  In the past within the bicycle exercise test. Unfortunately is not available to me now. We do note her echocardiogram of his life. It is not unreasonable to do a GXT in order to assess her exercise tolerance and 1 response of symptoms is.

## 2014-08-18 NOTE — Telephone Encounter (Signed)
Encounter complete. 

## 2014-08-18 NOTE — Assessment & Plan Note (Signed)
I do think that some of the discomfort she is feeling a sharp stabbing type discomfort in chest lasting 20 minutes is probably musculoskeletal and noncardiac in nature.  The patient should be safe doing A treadmill stress test.. I would like to be here

## 2014-08-19 ENCOUNTER — Telehealth (HOSPITAL_COMMUNITY): Payer: Self-pay

## 2014-08-19 ENCOUNTER — Telehealth: Payer: Self-pay | Admitting: Cardiology

## 2014-08-19 DIAGNOSIS — E785 Hyperlipidemia, unspecified: Secondary | ICD-10-CM

## 2014-08-19 LAB — NMR LIPOPROFILE WITH LIPIDS

## 2014-08-19 LAB — CARDIO IQ ADV LIPID AND INFLAM PNL

## 2014-08-19 NOTE — Telephone Encounter (Signed)
Katrina from [facility] calling to request repeat of order for a redraw - gave test code [phone].

## 2014-08-19 NOTE — Telephone Encounter (Signed)
Pt to bring in printed copy of instructions given to her here in the office. I will compare this printout with known instructions.

## 2014-08-20 ENCOUNTER — Ambulatory Visit (HOSPITAL_COMMUNITY)
Admission: RE | Admit: 2014-08-20 | Discharge: 2014-08-20 | Disposition: A | Discharge status: Home / Self Care | Payer: 59 | Source: Ambulatory Visit | Attending: Cardiology | Admitting: Cardiology

## 2014-08-20 DIAGNOSIS — R0789 Other chest pain: Secondary | ICD-10-CM | POA: Insufficient documentation

## 2014-08-20 DIAGNOSIS — R0600 Dyspnea, unspecified: Secondary | ICD-10-CM

## 2014-08-20 DIAGNOSIS — E785 Hyperlipidemia, unspecified: Secondary | ICD-10-CM | POA: Diagnosis not present

## 2014-08-20 DIAGNOSIS — R0609 Other forms of dyspnea: Secondary | ICD-10-CM | POA: Diagnosis present

## 2014-08-20 LAB — EXERCISE TOLERANCE TEST
CHL CUP STRESS STAGE 1 GRADE: 0 %
CHL CUP STRESS STAGE 1 SBP: 143 mmHg
CHL CUP STRESS STAGE 1 SPEED: 0 mph
CHL CUP STRESS STAGE 4 SPEED: 1.7 mph
CHL CUP STRESS STAGE 5 DBP: 88 mmHg
CHL CUP STRESS STAGE 5 SBP: 168 mmHg
CHL CUP STRESS STAGE 5 SPEED: 2.5 mph
CHL CUP STRESS STAGE 6 SBP: 199 mmHg
CHL CUP STRESS STAGE 8 SBP: 190 mmHg
CHL CUP STRESS STAGE 8 SPEED: 0 mph
CHL CUP STRESS STAGE 9 DBP: 85 mmHg
CHL CUP STRESS STAGE 9 SBP: 145 mmHg
CHL CUP STRESS STAGE 9 SPEED: 0 mph
CSEPED: 9 min
CSEPPMHR: 98 %
Estimated workload: 11 METS
Exercise duration (sec): 36 s
MPHR: 163 {beats}/min
Peak HR: 160 {beats}/min
Percent HR: 98 %
RPE: 31840
Rest HR: 68 {beats}/min
Stage 1 DBP: 89 mmHg
Stage 1 HR: 83 {beats}/min
Stage 2 Grade: 0 %
Stage 2 HR: 85 {beats}/min
Stage 2 Speed: 1 mph
Stage 3 Grade: 0.1 %
Stage 3 HR: 85 {beats}/min
Stage 3 Speed: 1 mph
Stage 4 DBP: 77 mmHg
Stage 4 Grade: 10 %
Stage 4 HR: 107 {beats}/min
Stage 4 SBP: 146 mmHg
Stage 5 Grade: 12 %
Stage 5 HR: 127 {beats}/min
Stage 6 DBP: 94 mmHg
Stage 6 Grade: 14 %
Stage 6 HR: 146 {beats}/min
Stage 6 Speed: 3.4 mph
Stage 7 Grade: 16 %
Stage 7 HR: 160 {beats}/min
Stage 7 Speed: 4.2 mph
Stage 8 DBP: 91 mmHg
Stage 8 Grade: 0 %
Stage 8 HR: 129 {beats}/min
Stage 9 Grade: 0 %
Stage 9 HR: 89 {beats}/min

## 2014-08-23 ENCOUNTER — Other Ambulatory Visit: Payer: Self-pay | Admitting: *Deleted

## 2014-08-23 ENCOUNTER — Telehealth: Payer: Self-pay | Admitting: *Deleted

## 2014-08-23 NOTE — Telephone Encounter (Signed)
-----   Message from Leonie Man, MD sent at 08/23/2014 12:13 PM EDT ----- Good job on the TM Stress Test - good exercise tolerance. -- BP was high, so we will need to work on that.  [place]

## 2014-08-23 NOTE — Telephone Encounter (Signed)
LEFT MESSAGE OF RESULTS ON SECURE VOICEMAIL AND RELEASE TO MY CHART

## 2014-08-24 ENCOUNTER — Encounter: Payer: Self-pay | Admitting: Obstetrics and Gynecology

## 2014-08-24 LAB — CARDIO IQ(R) ADVANCED LIPID PANEL
Apolipoprotein B: 104 mg/dL — ABNORMAL HIGH (ref 49–103)
CHOLESTEROL/HDL RATIO (CARDIO IQ ADV LIPID PANEL): 2.4 calc (ref <=5.0)
Cholesterol, Total: 262 mg/dL — ABNORMAL HIGH (ref 125–200)
HDL Cholesterol: 109 mg/dL (ref >=46)
LDL Large: 12700 nmol/L (ref 5038–17886)
LDL Medium: 380 nmol/L (ref 121–397)
LDL PEAK SIZE: 229.3 Angstrom (ref >=218.2)
LDL Particle Number: 1973 nmol/L (ref 1016–2185)
LDL SMALL: 293 nmol/L (ref 115–386)
LDL, Calculated: 142 mg/dL — ABNORMAL HIGH
Lipoprotein (a): 14 nmol/L (ref <75)
NON-HDL CHOLESTEROL (CARDIO IQ ADV LIPID PANEL): 153 mg/dL
TRIGLYCERIDES (CARDIO IQ ADV LIPID PANEL): 54 mg/dL

## 2014-08-31 ENCOUNTER — Encounter: Payer: Self-pay | Admitting: Cardiology

## 2014-09-08 ENCOUNTER — Other Ambulatory Visit: Payer: Self-pay

## 2014-09-08 ENCOUNTER — Encounter: Payer: Self-pay | Admitting: Family Medicine

## 2014-09-08 DIAGNOSIS — E785 Hyperlipidemia, unspecified: Secondary | ICD-10-CM

## 2014-09-20 ENCOUNTER — Telehealth: Payer: Self-pay | Admitting: Obstetrics and Gynecology

## 2014-09-20 NOTE — Telephone Encounter (Signed)
Patient called and has questions about her Prometrium. She says the pharmacy does not have the Rx.

## 2014-09-20 NOTE — Telephone Encounter (Signed)
I spoke with patient about her medications, she said they were suppose to be sent to [facility] in service however she spoke with [facility] and they told her [facility] could use [facility]. So she is going to have her meds transferred from [facility] to [facility]. -eh

## 2014-10-05 ENCOUNTER — Telehealth: Payer: Self-pay | Admitting: Cardiology

## 2014-10-05 DIAGNOSIS — R0609 Other forms of dyspnea: Principal | ICD-10-CM

## 2014-10-05 DIAGNOSIS — E785 Hyperlipidemia, unspecified: Secondary | ICD-10-CM

## 2014-10-05 DIAGNOSIS — R0600 Dyspnea, unspecified: Secondary | ICD-10-CM

## 2014-10-05 MED ORDER — PRAVASTATIN SODIUM 20 MG PO TABS: 20.0000 mg | ORAL_TABLET | Freq: Every evening | ORAL | Status: DC

## 2014-10-05 NOTE — Telephone Encounter (Signed)
Refill submitted to patient's preferred pharmacy.  

## 2014-10-05 NOTE — Telephone Encounter (Signed)
°  1. Which medications need to be refilled? Pravastatin-new prescription for 90 days,so ins will pay  2. Which pharmacy is medication to [facility] to?[phone]  3. Do they need a 30 day or 90 day supply? 90 and refills  4. Would they like a call back once the medication has been sent to the pharmacy? no

## 2014-10-07 ENCOUNTER — Telehealth: Payer: Self-pay | Admitting: Cardiology

## 2014-10-07 ENCOUNTER — Encounter: Payer: Self-pay | Admitting: Cardiology

## 2014-10-08 NOTE — Telephone Encounter (Signed)
Close encounter 

## 2014-10-25 ENCOUNTER — Ambulatory Visit: Payer: 59 | Admitting: Cardiology

## 2014-10-26 ENCOUNTER — Ambulatory Visit: Payer: 59 | Admitting: Cardiology

## 2014-10-28 ENCOUNTER — Encounter: Payer: Self-pay | Admitting: Cardiology

## 2014-10-28 ENCOUNTER — Ambulatory Visit (INDEPENDENT_AMBULATORY_CARE_PROVIDER_SITE_OTHER): Payer: 59 | Admitting: Cardiology

## 2014-10-28 VITALS — BP 130/80 | HR 60 | Ht 65.0 in | Wt 150.0 lb

## 2014-10-28 DIAGNOSIS — Z8249 Family history of ischemic heart disease and other diseases of the circulatory system: Secondary | ICD-10-CM

## 2014-10-28 DIAGNOSIS — R0609 Other forms of dyspnea: Secondary | ICD-10-CM | POA: Diagnosis not present

## 2014-10-28 DIAGNOSIS — E785 Hyperlipidemia, unspecified: Secondary | ICD-10-CM

## 2014-10-28 DIAGNOSIS — R0600 Dyspnea, unspecified: Secondary | ICD-10-CM

## 2014-10-28 NOTE — Patient Instructions (Signed)
Lipids only in October 2016  NO CHANGE IN CURRENT MEDICATIONS   Your physician wants you to follow-up in 12 MONTHS DR HARDING.  You will receive a reminder letter in the mail two months in advance. If you don't receive a letter, please call our office to schedule the follow-up appointment.

## 2014-10-28 NOTE — Progress Notes (Signed)
PCP: Ria Bush, MD  Clinic Note: Chief Complaint  Patient presents with  . 2 MONTHS    Patients has swelling in her hands and feet.  . Hyperlipidemia    HPI: [name] is a 59 y.o. female with a PMH below who presents today for a 78-month followup  She is the wife of another patient of mine [name]) [place]. She very pleasant woman who is as active as she can be admitted walking 2 miles a day. She's had a relatively significant cardiac evaluation including:  Cardiopulmonary exercise test that suggested possible microvascular ischemia.   Echocardiogram performed which was relatively normal. The thought processes that her symptoms are probably microvascular ischemia in nature. Risk factor modification is suggested. She had been on statin, but this was stopped., We restarted during her last visit in May.  Last seen in May 2016 -- CardioIQ & GXT ordered --> results below.   Cardio IQ checked - see labs  GXT 08/20/14- 9:36 min, HR 68 --> 160.  98% MPHR; 11METS ; No EKG changes  Past Medical History  Diagnosis Date  . History of chicken pox   . Heart murmur     PVCs thought from MVP -- reportedly had echocardiogram in 2005 at [facility]; follow-up Echo February 2015: Normal LV size and function. EF 60-65% with no RWMA, no aortic stenosis, trivial MR with no suggestion of prolapse,  . Anxiety   . History of colon polyps   . Diverticulosis     by colonosocpy  . Vitamin D deficiency   . HLD (hyperlipidemia)     mild  . CAD (coronary artery disease)     CPET Test suggests mild potential ischemic findings - likely Microvascular; Normal LV function on Echo, Overall Good score of CPET  . Malignant melanoma 1988    L thigh, stage 3, yearly skin checks with Dr .[place] at [facility]    Prior Cardiac Evaluation and Past Surgical History: Past Surgical History  Procedure Laterality Date  . Mandible surgery  1987  . Cesarean section  1982 and 1989  . Tubal  ligation  1998  . Skin surgery  1988    melanoma excision  . Colonoscopy  2008    small hyperplastic polyps, small int hem, early diverticula ([place]) rpt 10 yrs  . Dilation and curettage of uterus  1999  . Cardiovascular stress test (cpet)  2015    Low Risk Study, but evidence of early Myocardial Dysfunction  . Dilatation & currettage/hysteroscopy with resectocope N/A 06/02/2013    Procedure: DILATATION & CURETTAGE/HYSTEROSCOPY WITH RESECTOCOPE;  Surgeon: Jamey Reas de Berton Lan, MD;  Location: [facility];  Service: Gynecology;  Laterality: N/A;  . US echocardiography  05/2013    WNL, likely microvascular disease  . Cardiovascular stress test  08/2014    ETT - low risk, good exercise tolerance [name])    Interval History:  She presents today for f/u to discuss results.  CardioIQ shows TC & LDL above target levels (~240 & 140 with HDL 109).  She did well on GXT & has since restarted her routine exercise regiment of daily walks.  She also did a ~30day "detox" diet & revamped her diet since getting the lab results.   She feels great since making these changes.  Cardiovascular ROS: no chest pain or dyspnea on exertion negative for - edema, irregular heartbeat, loss of consciousness, murmur, orthopnea, palpitations, paroxysmal nocturnal dyspnea, rapid heart rate or shortness of breath  No TIA/amaurosis fugax symptoms. No melena, hematochezia, hematuria, or epstaxis. No claudication.  ROS: A comprehensive was performed. Review of Systems  Constitutional: Positive for malaise/fatigue (improved exercise tolerance).  Respiratory: Negative.   Cardiovascular: Negative for palpitations and claudication.  Gastrointestinal: Negative for blood in stool and melena.  Genitourinary: Negative for hematuria.  Musculoskeletal:       She does have mild arthritis pains in certain areas hips and joints but nothing overly worrisome to her. No myalgias or arthralgias that are severe  Neurological:  Negative for headaches.  Psychiatric/Behavioral: Nervous/anxious: much less anxious since restarting exercise.   All other systems reviewed and are negative.   Current Outpatient Prescriptions on File Prior to Visit  Medication Sig Dispense Refill  . Ascorbic Acid (VITAMIN C) 1000 MG tablet Take by mouth daily.     . Estradiol Acetate 0.05 MG/24HR RING Place 1 each vaginally every 3 (three) months. 1 each 3  . NON FORMULARY Mitomax; Alpha CRS; Microplex VM; xEO Mega; vEO Mega (all Essential Oil therapy)    . Omega-3 Fatty Acids (FISH OIL) 1000 MG CAPS Take by mouth daily.    . pravastatin (PRAVACHOL) 20 MG tablet Take 1 tablet (20 mg total) by mouth every evening. 90 tablet 3  . progesterone (PROMETRIUM) 100 MG capsule Take 1 capsule (100 mg total) by mouth daily. 90 capsule 3  . Vitamin D, Ergocalciferol, (DRISDOL) 50000 UNITS CAPS capsule TAKE 1 CAPSULE EVERY OTHER WEEK 6 capsule 3  . zolpidem (AMBIEN) 10 MG tablet Take 1/2 tablet (5 mg) at night if needed for insomnia 30 tablet 1   No current facility-administered medications on file prior to visit.   No Known Allergies   History  Substance Use Topics  . Smoking status: Former Smoker    Quit date: 04/09/1992  . Smokeless tobacco: Never Used     Comment: quit 20 years ago  . Alcohol Use: 2.0 oz/week    4 Standard drinks or equivalent per week     Comment: 4 glasses of wine a week   Family History  Problem Relation Age of Onset  . Coronary artery disease Father 32    MI?  . Other Mother 7    pulm aneurysm  . Stroke Maternal Grandmother   . Hypertension Sister   . Coronary artery disease Brother 54    s/p CABG  . Heart disease Sister 33    CHF, deceased  . Cancer Neg Hx   . Diabetes Neg Hx     Wt Readings from Last 3 Encounters:  10/28/14 68.04 kg (150 lb)  08/16/14 69.355 kg (152 lb 14.4 oz)  07/21/14 68.493 kg (151 lb)    PHYSICAL EXAM BP 130/80 mmHg  Pulse 60  Ht 5\' 5"  (1.651 m)  Wt 68.04 kg (150 lb)  BMI  24.96 kg/m2  LMP 12/15/2011 General appearance: alert, cooperative, appears stated age, no distress and Well-nourished and well-groomed. Answers questions appropriately. Normal mood and affect. HEENT: [place]/AT, EOMI, MMM, anicteric sclera Neck: no adenopathy, no carotid bruit, no JVD and supple, symmetrical, trachea midline Lungs: clear to auscultation bilaterally, normal percussion bilaterally and Nonlabored, good air movement Heart: regular rate and rhythm, S1, S2 normal, no murmur, click, rub or gallop and normal apical impulse Abdomen: soft, non-tender; bowel sounds normal; no masses, no organomegaly Extremities: extremities normal, atraumatic, no cyanosis or edema and no edema, redness or tenderness in the calves or thighs Pulses: 2+ and symmetric Neurologic: Alert and oriented X 3, normal strength and  tone. Normal symmetric reflexes. Normal coordination and gait;   Adult ECG Report  - not checked  Recent Labs:   See labs - to complicated to import Cardio IQ.  ASSESSMENT / PLAN:   Problem List Items Addressed This Visit    DOE (dyspnea on exertion) (Chronic)     Notably improved now that she has gotten back into exercise. She did well on her treadmill stress test reaching 11  METs      Family history of early CAD (Chronic)     Otherwise doing relatively well. We are monitoring her lipids and she is back to ab active lifestyle , and modifying her diet accordingly.      Relevant Orders   Lipid panel   HLD (hyperlipidemia) - Primary (Chronic)     Tolerating pravastatin well without  Complication.  she had Cardiolite Q labs checked which do show that her profile is above goal.  Will recheck a regular screening cholesterol panel in roughly 6 months. She has dramatic change her exercise and diet regimen. Hopefully this will help.      Relevant Orders   Lipid panel      Followup: ~ 1 yr   Johnnell Liou, Leonie Green, M.D., M.S. Interventional Cardiologist   Pager #  [phone]

## 2014-10-30 ENCOUNTER — Encounter: Payer: Self-pay | Admitting: Cardiology

## 2014-10-30 NOTE — Assessment & Plan Note (Signed)
Tolerating pravastatin well without  Complication.  she had Cardiolite Q labs checked which do show that her profile is above goal.  Will recheck a regular screening cholesterol panel in roughly 6 months. She has dramatic change her exercise and diet regimen. Hopefully this will help.

## 2014-10-30 NOTE — Assessment & Plan Note (Signed)
Otherwise doing relatively well. We are monitoring her lipids and she is back to ab active lifestyle , and modifying her diet accordingly.

## 2014-10-30 NOTE — Assessment & Plan Note (Signed)
Notably improved now that she has gotten back into exercise. She did well on her treadmill stress test reaching 11  METs

## 2015-02-03 ENCOUNTER — Ambulatory Visit (INDEPENDENT_AMBULATORY_CARE_PROVIDER_SITE_OTHER): Payer: 59

## 2015-02-03 DIAGNOSIS — Z23 Encounter for immunization: Secondary | ICD-10-CM | POA: Diagnosis not present

## 2015-02-04 LAB — HEPATIC FUNCTION PANEL
ALK PHOS: 60 U/L (ref 33–130)
ALT: 13 U/L (ref 6–29)
AST: 15 U/L (ref 10–35)
Albumin: 4.2 g/dL (ref 3.6–5.1)
BILIRUBIN INDIRECT: 0.5 mg/dL (ref 0.2–1.2)
Bilirubin, Direct: 0.1 mg/dL (ref <=0.2)
Total Bilirubin: 0.6 mg/dL (ref 0.2–1.2)
Total Protein: 6.4 g/dL (ref 6.1–8.1)

## 2015-02-04 LAB — LIPID PANEL
CHOLESTEROL: 222 mg/dL — AB (ref 125–200)
HDL: 115 mg/dL (ref >=46)
LDL Cholesterol: 98 mg/dL (ref <130)
TRIGLYCERIDES: 45 mg/dL (ref <150)
Total CHOL/HDL Ratio: 1.9 Ratio (ref <=5.0)
VLDL: 9 mg/dL (ref <30)

## 2015-02-09 ENCOUNTER — Telehealth: Payer: Self-pay | Admitting: *Deleted

## 2015-02-09 DIAGNOSIS — E785 Hyperlipidemia, unspecified: Secondary | ICD-10-CM

## 2015-02-09 DIAGNOSIS — I519 Heart disease, unspecified: Secondary | ICD-10-CM

## 2015-02-09 DIAGNOSIS — Z8249 Family history of ischemic heart disease and other diseases of the circulatory system: Secondary | ICD-10-CM

## 2015-02-09 NOTE — Telephone Encounter (Signed)
Spoke to patient. Result given . Verbalized understanding ORDERED CARDIO IQ FOR 01/2016

## 2015-02-09 NOTE — Telephone Encounter (Signed)
-----   Message from Leonie Man, MD sent at 02/09/2015  1:32 PM EDT ----- So overall, the labs look better. Total cholesterol is down. HDL is up and LDL is down. Both of these changes are in the correct direction. Would probably followup Cardio IQ in a year to see correlation with measured vs. Calculated data  HARDING, Leonie Green, MD

## 2015-03-11 ENCOUNTER — Other Ambulatory Visit: Payer: Self-pay | Admitting: Family Medicine

## 2015-03-11 ENCOUNTER — Other Ambulatory Visit (INDEPENDENT_AMBULATORY_CARE_PROVIDER_SITE_OTHER): Payer: 59

## 2015-03-11 DIAGNOSIS — E559 Vitamin D deficiency, unspecified: Secondary | ICD-10-CM

## 2015-03-11 DIAGNOSIS — E785 Hyperlipidemia, unspecified: Secondary | ICD-10-CM

## 2015-03-11 LAB — BASIC METABOLIC PANEL
BUN: 16 mg/dL (ref 7–25)
CALCIUM: 8.6 mg/dL (ref 8.6–10.4)
CO2: 27 mmol/L (ref 20–31)
Chloride: 102 mmol/L (ref 98–110)
Creat: 0.55 mg/dL (ref 0.50–1.05)
GLUCOSE: 83 mg/dL (ref 65–99)
POTASSIUM: 4 mmol/L (ref 3.5–5.3)
SODIUM: 137 mmol/L (ref 135–146)

## 2015-03-12 LAB — VITAMIN D 25 HYDROXY (VIT D DEFICIENCY, FRACTURES): VIT D 25 HYDROXY: 28 ng/mL — AB (ref 30–100)

## 2015-03-18 ENCOUNTER — Encounter: Payer: Self-pay | Admitting: Family Medicine

## 2015-03-18 ENCOUNTER — Ambulatory Visit (INDEPENDENT_AMBULATORY_CARE_PROVIDER_SITE_OTHER): Payer: 59 | Admitting: Family Medicine

## 2015-03-18 VITALS — BP 128/80 | HR 72 | Temp 98.4°F | Ht 64.5 in | Wt 155.2 lb

## 2015-03-18 DIAGNOSIS — E785 Hyperlipidemia, unspecified: Secondary | ICD-10-CM

## 2015-03-18 DIAGNOSIS — I2510 Atherosclerotic heart disease of native coronary artery without angina pectoris: Secondary | ICD-10-CM

## 2015-03-18 DIAGNOSIS — F5104 Psychophysiologic insomnia: Secondary | ICD-10-CM

## 2015-03-18 DIAGNOSIS — Z0000 Encounter for general adult medical examination without abnormal findings: Secondary | ICD-10-CM | POA: Diagnosis not present

## 2015-03-18 DIAGNOSIS — E559 Vitamin D deficiency, unspecified: Secondary | ICD-10-CM

## 2015-03-18 MED ORDER — VITAMIN D (ERGOCALCIFEROL) 1.25 MG (50000 UNIT) PO CAPS: ORAL_CAPSULE | ORAL | Status: DC

## 2015-03-18 MED ORDER — TRAZODONE HCL 50 MG PO TABS: 25.0000 mg | ORAL_TABLET | Freq: Every evening | ORAL | Status: DC | PRN

## 2015-03-18 NOTE — Assessment & Plan Note (Addendum)
Pt suffers from what sounds like longstanding maintenance insomnia. Failed several meds including ambien and OTC sleep aides.  Discussed sleep hygiene. Trial trazodone 25-50mg  nightly. Consider silenor if insurance will cover.

## 2015-03-18 NOTE — Assessment & Plan Note (Signed)
Followed closely by cards. On statin. Appreciate their care.

## 2015-03-18 NOTE — Assessment & Plan Note (Signed)
Chronic, stable. Continue pravastatin/fish oil. Followed closely by cards.

## 2015-03-18 NOTE — Assessment & Plan Note (Signed)
Preventative protocols reviewed and updated unless pt declined. Discussed healthy diet and lifestyle.  

## 2015-03-18 NOTE — Patient Instructions (Addendum)
Let's try trazodone 1/2-1 tablet nightly for sleep. Update me with effect after 1-2 weeks.  Ask Dr [name] about preventative aspirin. Good to see you today, call us with questions. Return as needed or in 1 year for next physical.      Mediterranean Diet  Why follow it? Research shows. . Those who follow the Mediterranean diet have a reduced risk of heart disease  . The diet is associated with a reduced incidence of Parkinson's and Alzheimer's diseases . People following the diet may have longer life expectancies and lower rates of chronic diseases  . The Dietary Guidelines for Americans recommends the Mediterranean diet as an eating plan to promote health and prevent disease  What Is the Mediterranean Diet?  . Healthy eating plan based on typical foods and recipes of Mediterranean-style cooking . The diet is primarily a plant based diet; these foods should make up a majority of meals   Starches - Plant based foods should make up a majority of meals - They are an important sources of vitamins, minerals, energy, antioxidants, and fiber - Choose whole grains, foods high in fiber and minimally processed items  - Typical grain sources include wheat, oats, barley, corn, brown rice, bulgar, farro, millet, polenta, couscous  - Various types of beans include chickpeas, lentils, fava beans, black beans, white beans   Fruits  Veggies - Large quantities of antioxidant rich fruits & veggies; 6 or more servings  - Vegetables can be eaten raw or lightly drizzled with oil and cooked  - Vegetables common to the traditional Mediterranean Diet include: artichokes, arugula, beets, broccoli, brussel sprouts, cabbage, carrots, celery, collard greens, cucumbers, eggplant, kale, leeks, lemons, lettuce, mushrooms, okra, onions, peas, peppers, potatoes, pumpkin, radishes, rutabaga, shallots, spinach, sweet potatoes, turnips, zucchini - Fruits common to the Mediterranean Diet include: apples, apricots, avocados,  cherries, clementines, dates, figs, grapefruits, grapes, melons, nectarines, oranges, peaches, pears, pomegranates, strawberries, tangerines  Fats - Replace butter and margarine with healthy oils, such as olive oil, canola oil, and tahini  - Limit nuts to no more than a handful a day  - Nuts include walnuts, almonds, pecans, pistachios, pine nuts  - Limit or avoid candied, honey roasted or heavily salted nuts - Olives are central to the [employer] - can be eaten whole or used in a variety of dishes   Meats Protein - Limiting red meat: no more than a few times a month - When eating red meat: choose lean cuts and keep the portion to the size of deck of cards - Eggs: approx. 0 to 4 times a week  - Fish and lean poultry: at least 2 a week  - Healthy protein sources include, chicken, [place], lean beef, lamb - Increase intake of seafood such as tuna, salmon, trout, mackerel, shrimp, scallops - Avoid or limit high fat processed meats such as sausage and bacon  Dairy - Include moderate amounts of low fat dairy products  - Focus on healthy dairy such as fat free yogurt, skim milk, low or reduced fat cheese - Limit dairy products higher in fat such as whole or 2% milk, cheese, ice cream  Alcohol - Moderate amounts of red wine is ok  - No more than 5 oz daily for women (all ages) and men older than age 65  - No more than 10 oz of wine daily for men younger than 29  Other - Limit sweets and other desserts  - Use herbs and spices instead of salt to flavor  foods  - Herbs and spices common to the traditional Mediterranean Diet include: basil, bay leaves, chives, cloves, cumin, fennel, garlic, lavender, marjoram, mint, oregano, parsley, pepper, rosemary, sage, savory, sumac, tarragon, thyme   It's not just a diet, it's a lifestyle:  . The Mediterranean diet includes lifestyle factors typical of those in the region  . Foods, drinks and meals are best eaten with others and savored . Daily physical  activity is important for overall good health . This could be strenuous exercise like running and aerobics . This could also be more leisurely activities such as walking, housework, yard-work, or taking the stairs . Moderation is the key; a balanced and healthy diet accommodates most foods and drinks . Consider portion sizes and frequency of consumption of certain foods   Meal Ideas & Options:  . Breakfast:  o Whole wheat toast or whole wheat English muffins with peanut butter & hard boiled egg o Steel cut oats topped with apples & cinnamon and skim milk  o Fresh fruit: banana, strawberries, melon, berries, peaches  o Smoothies: strawberries, bananas, greek yogurt, peanut butter o Low fat greek yogurt with blueberries and granola  o Egg white omelet with spinach and mushrooms o Breakfast couscous: whole wheat couscous, apricots, skim milk, cranberries  . Sandwiches:  o Hummus and grilled vegetables (peppers, zucchini, squash) on whole wheat bread   o Grilled chicken on whole wheat pita with lettuce, tomatoes, cucumbers or tzatziki  o Tuna salad on whole wheat bread: tuna salad made with greek yogurt, olives, red peppers, capers, green onions o Garlic rosemary lamb pita: lamb sauted with garlic, rosemary, salt & pepper; add lettuce, cucumber, greek yogurt to pita - flavor with lemon juice and black pepper  . Seafood:  o Mediterranean grilled salmon, seasoned with garlic, basil, parsley, lemon juice and black pepper o Shrimp, lemon, and spinach whole-grain pasta salad made with low fat greek yogurt  o Seared scallops with lemon orzo  o Seared tuna steaks seasoned salt, pepper, coriander topped with tomato mixture of olives, tomatoes, olive oil, minced garlic, parsley, green onions and cappers  . Meats:  o Herbed greek chicken salad with kalamata olives, cucumber, feta  o Red bell peppers stuffed with spinach, bulgur, lean ground beef (or lentils) & topped with feta   o Kebabs: skewers of  chicken, tomatoes, onions, zucchini, squash  o [place] burgers: made with red onions, mint, dill, lemon juice, feta cheese topped with roasted red peppers . Vegetarian o Cucumber salad: cucumbers, artichoke hearts, celery, red onion, feta cheese, tossed in olive oil & lemon juice  o Hummus and whole grain pita points with a greek salad (lettuce, tomato, feta, olives, cucumbers, red onion) o Lentil soup with celery, carrots made with vegetable broth, garlic, salt and pepper  o Tabouli salad: parsley, bulgur, mint, scallions, cucumbers, tomato, radishes, lemon juice, olive oil, salt and pepper.

## 2015-03-18 NOTE — Progress Notes (Signed)
Pre visit review using our clinic review tool, if applicable. No additional management support is needed unless otherwise documented below in the visit note. 

## 2015-03-18 NOTE — Assessment & Plan Note (Signed)
Continue 50K units every other week. Refilled today.

## 2015-03-18 NOTE — Progress Notes (Signed)
BP 128/80 mmHg  Pulse 72  Temp(Src) 98.4 F (36.9 C) (Oral)  Ht 5' 4.5" (1.638 m)  Wt 155 lb 4 oz (70.421 kg)  BMI 26.25 kg/m2  LMP 12/15/2011   CC: CPE  Subjective:    Patient ID: [name], female    DOB: July 28, 1955, 60 y.o.   MRN: [id number]  HPI: [name] is a 59 y.o. female presenting on 03/18/2015 for Annual Exam   Early CAD - established with Dr [name]. Probable microvascular ischemia. Lipitor 5mg  QOD caused muscle aches of upper arms and left leg. Lipitor was changed to pravastatin 20mg  nightly and she is tolerating much better. Also taking fish oil 1000mg  daily. Restarted exercise regimen and feeling overall better.   CARDIOVASCULAR STRESS TEST Date: 08/2014 ETT - low risk, good exercise tolerance [name]).   Strong fmhx CAD - father age 62, brother age 58, sister with CHF age 13 deceased.   Chronic insomnia - Prescribed xanax 0.5mg  1/2 tab nightly prn by Dr Kathline Magic prior PCP. This has worked well. Ambien was not helpful, felt fog entire next day. More trouble with sleep maintenance insomnia. Tried and failed benadryl and melatonin.   Preventative: Well woman with OBGYN Dr. Josefa Ayala - April 2016. Normal pap smear 2015. On low dose estrogen/progesterone for hot flashes. COLONOSCOPY Date: 2008 small hyperplastic polyps, small int hem, early diverticula Collene Mares) rpt 10 yrs Mammogram - 07/2014 WNL, [facility] Flu shot yearly Tdap - 2012 Seat belt [place] discussed.  Sunscreen use discussed. H/o melanoma, followed regularly by derm.   Caffeine: 2 cups coffee/day  Lives with husband, 2 dogs.  Grown children  Occupation: Museum/gallery exhibitions officer Activity: walks 3 mi 3x/wk (1 hour)  Diet: good water, daily fruits/vegetables, red meat 1x/wk, fish seldom   Relevant past medical, surgical, family and social history reviewed and updated as indicated. Interim medical history since our last visit reviewed. Allergies and medications reviewed and updated. Current Outpatient  Prescriptions on File Prior to Visit  Medication Sig  . Ascorbic Acid (VITAMIN C) 1000 MG tablet Take by mouth daily.   . Estradiol Acetate 0.05 MG/24HR RING Place 1 each vaginally every 3 (three) months.  . Omega-3 Fatty Acids (FISH OIL) 1000 MG CAPS Take by mouth daily.  . pravastatin (PRAVACHOL) 20 MG tablet Take 1 tablet (20 mg total) by mouth every evening.  . progesterone (PROMETRIUM) 100 MG capsule Take 1 capsule (100 mg total) by mouth daily.   No current facility-administered medications on file prior to visit.    Review of Systems  Constitutional: Negative for fever, chills, activity change, appetite change, fatigue and unexpected weight change.       Occ hot flashes  HENT: Negative for hearing loss.   Eyes: Negative for visual disturbance.  Respiratory: Negative for cough, chest tightness, shortness of breath and wheezing.   Cardiovascular: Negative for chest pain, palpitations and leg swelling.  Gastrointestinal: Negative for nausea, vomiting, abdominal pain, diarrhea, constipation, blood in stool and abdominal distention.  Genitourinary: Negative for hematuria and difficulty urinating.  Musculoskeletal: Negative for myalgias, arthralgias and neck pain.  Skin: Negative for rash.  Neurological: Negative for dizziness, seizures, syncope and headaches.  Hematological: Negative for adenopathy. Does not bruise/bleed easily.  Psychiatric/Behavioral: Negative for dysphoric mood. The patient is not nervous/anxious.    Per HPI unless specifically indicated in ROS section     Objective:    BP 128/80 mmHg  Pulse 72  Temp(Src) 98.4 F (36.9 C) (Oral)  Ht 5' 4.5" (  1.638 m)  Wt 155 lb 4 oz (70.421 kg)  BMI 26.25 kg/m2  LMP 12/15/2011  Wt Readings from Last 3 Encounters:  03/18/15 155 lb 4 oz (70.421 kg)  10/28/14 150 lb (68.04 kg)  08/16/14 152 lb 14.4 oz (69.355 kg)    Physical Exam  Constitutional: She is oriented to person, place, and time. She appears well-developed and  well-nourished. No distress.  HENT:  Head: Normocephalic and atraumatic.  Right Ear: Hearing, tympanic membrane, external ear and ear canal normal.  Left Ear: Hearing, tympanic membrane, external ear and ear canal normal.  Nose: Nose normal.  Mouth/Throat: Uvula is midline, oropharynx is clear and moist and mucous membranes are normal. No oropharyngeal exudate, posterior oropharyngeal edema or posterior oropharyngeal erythema.  Eyes: Conjunctivae and EOM are normal. Pupils are equal, round, and reactive to light. No scleral icterus.  Neck: Normal range of motion. Neck supple. No thyromegaly present.  Cardiovascular: Normal rate, regular rhythm, normal heart sounds and intact distal pulses.   No murmur heard. Pulses:      Radial pulses are 2+ on the right side, and 2+ on the left side.  Pulmonary/Chest: Effort normal and breath sounds normal. No respiratory distress. She has no wheezes. She has no rales.  Abdominal: Soft. Bowel sounds are normal. She exhibits no distension and no mass. There is no tenderness. There is no rebound and no guarding.  Musculoskeletal: Normal range of motion. She exhibits no edema.  Lymphadenopathy:    She has no cervical adenopathy.  Neurological: She is alert and oriented to person, place, and time.  CN grossly intact, station and gait intact  Skin: Skin is warm and dry. No rash noted.  Psychiatric: She has a normal mood and affect. Her behavior is normal. Judgment and thought content normal.  Nursing note and vitals reviewed.  Results for orders placed or performed in visit on 03/18/15  HM PAP SMEAR  Result Value Ref Range   HM Pap smear normal       Assessment & Plan:  Hep C screen next lab visit. Problem List Items Addressed This Visit    Vitamin D deficiency    Continue 50K units every other week. Refilled today.      Routine health maintenance - Primary    Preventative protocols reviewed and updated unless pt declined. Discussed healthy diet  and lifestyle.       HLD (hyperlipidemia) (Chronic)    Chronic, stable. Continue pravastatin/fish oil. Followed closely by cards.       Chronic insomnia    Pt suffers from what sounds like longstanding maintenance insomnia. Failed several meds including ambien and OTC sleep aides.  Discussed sleep hygiene. Trial trazodone 25-50mg  nightly. Consider silenor if insurance will cover.       CAD (coronary artery disease)    Followed closely by cards. On statin. Appreciate their care.          Follow up plan: Return in about 1 year (around 03/17/2016), or as needed, for annual exam, prior fasting for blood work.

## 2015-03-28 ENCOUNTER — Telehealth: Payer: Self-pay | Admitting: Family Medicine

## 2015-03-28 DIAGNOSIS — F5104 Psychophysiologic insomnia: Secondary | ICD-10-CM

## 2015-03-28 NOTE — Telephone Encounter (Signed)
Pt called to let you know that the new rx (trazodone) your gave her is not working.   It is not helping her sleep she feels hungover the next day  [facility] [address]

## 2015-03-30 MED ORDER — DOXEPIN HCL 3 MG PO TABS: 1.0000 | ORAL_TABLET | Freq: Every evening | ORAL | Status: DC | PRN

## 2015-03-30 NOTE — Telephone Encounter (Signed)
Have sent in silenor for patient to price out at local pharmacy. If unaffordable may return to xanax.

## 2015-03-30 NOTE — Telephone Encounter (Signed)
Patient notified. Patient stated she would call back if Silenor was too expensive. Thanks!

## 2015-03-31 NOTE — Telephone Encounter (Signed)
Pt left v/m; Silenor is too expensive. Pt request cb with different med. [place]

## 2015-04-05 MED ORDER — ALPRAZOLAM 0.5 MG PO TABS: 0.2500 mg | ORAL_TABLET | Freq: Every evening | ORAL | Status: DC | PRN

## 2015-04-05 NOTE — Addendum Note (Signed)
Addended by: Ria Bush on: 04/05/2015 07:59 AM   Modules accepted: Orders, Medications

## 2015-04-05 NOTE — Telephone Encounter (Signed)
plz notify if she'd like we can return to alprazolam - and phone in per chart instructions. Thanks.

## 2015-04-05 NOTE — Assessment & Plan Note (Signed)
failed ambien, trazodone, OTC meds. Silenor unaffordable.

## 2015-04-06 NOTE — Telephone Encounter (Signed)
Spoke ith patient and she was agreeable to return to alprazolam. Rx called in as directed.

## 2015-04-06 NOTE — Telephone Encounter (Signed)
Message left for patient to return my call and advise if she would like alprazolam again.

## 2015-05-31 ENCOUNTER — Telehealth: Payer: Self-pay | Admitting: Family Medicine

## 2015-05-31 ENCOUNTER — Encounter (HOSPITAL_BASED_OUTPATIENT_CLINIC_OR_DEPARTMENT_OTHER): Payer: Self-pay

## 2015-05-31 ENCOUNTER — Emergency Department (HOSPITAL_BASED_OUTPATIENT_CLINIC_OR_DEPARTMENT_OTHER): Payer: Managed Care, Other (non HMO)

## 2015-05-31 ENCOUNTER — Emergency Department (HOSPITAL_BASED_OUTPATIENT_CLINIC_OR_DEPARTMENT_OTHER)
Admission: EM | Admit: 2015-05-31 | Discharge: 2015-05-31 | Disposition: A | Discharge status: Home / Self Care | Payer: Managed Care, Other (non HMO) | Attending: Emergency Medicine | Admitting: Emergency Medicine

## 2015-05-31 DIAGNOSIS — R011 Cardiac murmur, unspecified: Secondary | ICD-10-CM | POA: Insufficient documentation

## 2015-05-31 DIAGNOSIS — Z86010 Personal history of colonic polyps: Secondary | ICD-10-CM | POA: Insufficient documentation

## 2015-05-31 DIAGNOSIS — Z85820 Personal history of malignant melanoma of skin: Secondary | ICD-10-CM | POA: Insufficient documentation

## 2015-05-31 DIAGNOSIS — Z87891 Personal history of nicotine dependence: Secondary | ICD-10-CM | POA: Diagnosis not present

## 2015-05-31 DIAGNOSIS — R0602 Shortness of breath: Secondary | ICD-10-CM | POA: Diagnosis not present

## 2015-05-31 DIAGNOSIS — E559 Vitamin D deficiency, unspecified: Secondary | ICD-10-CM | POA: Insufficient documentation

## 2015-05-31 DIAGNOSIS — I2510 Atherosclerotic heart disease of native coronary artery without angina pectoris: Secondary | ICD-10-CM | POA: Insufficient documentation

## 2015-05-31 DIAGNOSIS — R0789 Other chest pain: Secondary | ICD-10-CM | POA: Diagnosis not present

## 2015-05-31 DIAGNOSIS — F419 Anxiety disorder, unspecified: Secondary | ICD-10-CM | POA: Insufficient documentation

## 2015-05-31 DIAGNOSIS — R002 Palpitations: Secondary | ICD-10-CM | POA: Diagnosis not present

## 2015-05-31 DIAGNOSIS — E785 Hyperlipidemia, unspecified: Secondary | ICD-10-CM | POA: Diagnosis not present

## 2015-05-31 DIAGNOSIS — Z79899 Other long term (current) drug therapy: Secondary | ICD-10-CM | POA: Insufficient documentation

## 2015-05-31 DIAGNOSIS — Z8619 Personal history of other infectious and parasitic diseases: Secondary | ICD-10-CM | POA: Insufficient documentation

## 2015-05-31 DIAGNOSIS — R69 Illness, unspecified: Secondary | ICD-10-CM | POA: Diagnosis not present

## 2015-05-31 DIAGNOSIS — Z79818 Long term (current) use of other agents affecting estrogen receptors and estrogen levels: Secondary | ICD-10-CM | POA: Insufficient documentation

## 2015-05-31 LAB — COMPREHENSIVE METABOLIC PANEL
ALT: 14 U/L (ref 14–54)
ANION GAP: 5 (ref 5–15)
AST: 19 U/L (ref 15–41)
Albumin: 4.2 g/dL (ref 3.5–5.0)
Alkaline Phosphatase: 57 U/L (ref 38–126)
BUN: 14 mg/dL (ref 6–20)
CHLORIDE: 106 mmol/L (ref 101–111)
CO2: 28 mmol/L (ref 22–32)
Calcium: 8.5 mg/dL — ABNORMAL LOW (ref 8.9–10.3)
Creatinine, Ser: 0.7 mg/dL (ref 0.44–1.00)
Glucose, Bld: 90 mg/dL (ref 65–99)
POTASSIUM: 4 mmol/L (ref 3.5–5.1)
SODIUM: 139 mmol/L (ref 135–145)
Total Bilirubin: 0.7 mg/dL (ref 0.3–1.2)
Total Protein: 6.7 g/dL (ref 6.5–8.1)

## 2015-05-31 LAB — CBC
HCT: 41.4 % (ref 36.0–46.0)
Hemoglobin: 13.5 g/dL (ref 12.0–15.0)
MCH: 31.1 pg (ref 26.0–34.0)
MCHC: 32.6 g/dL (ref 30.0–36.0)
MCV: 95.4 fL (ref 78.0–100.0)
PLATELETS: 233 10*3/uL (ref 150–400)
RBC: 4.34 MIL/uL (ref 3.87–5.11)
RDW: 12.3 % (ref 11.5–15.5)
WBC: 6 10*3/uL (ref 4.0–10.5)

## 2015-05-31 LAB — TROPONIN I: Troponin I: <0.03 ng/mL (ref <0.031)

## 2015-05-31 NOTE — ED Provider Notes (Signed)
CSN: [id number]     Arrival date & time 05/31/15  1118 History   First MD Initiated Contact with Patient 05/31/15 1133     Chief Complaint  Patient presents with  . Palpitations     (Consider location/radiation/quality/duration/timing/severity/associated sxs/prior Treatment) HPI Comments: Patient with a history of prior palpitations, hyperlipidemia and mitral valve prolapse presents with palpitations. She states she commonly gets palpitations about once a month. She states over last 4-5 days a been more persistent. She occasionally has is some mild chest tightness or shortness of breath associated with the palpitations. She states it doesn't prevent her from doing her day-to-day activities. She denies any exertional symptoms. She denies any leg pain or swelling. No cough or chest congestion. No recent fevers. She doesn't have any increased caffeine intake. She denies any over-the-counter supplement use. She is followed by Dr. [name] with cardiology. She had a stress test about one year ago which was normal.  Patient is a 60 y.o. female presenting with palpitations.  Palpitations Associated symptoms: shortness of breath   Associated symptoms: no back pain, no chest pain, no cough, no diaphoresis, no dizziness, no nausea, no numbness, no vomiting and no weakness     Past Medical History  Diagnosis Date  . History of chicken pox   . Heart murmur     PVCs thought from MVP -- reportedly had echocardiogram in 2005 at [facility]; follow-up Echo February 2015: Normal LV size and function. EF 60-65% with no RWMA, no aortic stenosis, trivial MR with no suggestion of prolapse,  . Anxiety   . History of colon polyps   . Diverticulosis     by colonosocpy  . Vitamin D deficiency   . HLD (hyperlipidemia)     mild  . CAD (coronary artery disease)     CPET Test suggests mild potential ischemic findings - likely Microvascular; Normal LV function on Echo, Overall Good score of CPET  .  Malignant melanoma ([place]) 1988    L thigh, stage 3, yearly skin checks with Dr .[place] at [facility]   Past Surgical History  Procedure Laterality Date  . Mandible surgery  1987  . Cesarean section  1982 and 1989  . Tubal ligation  1998  . Skin surgery  1988    melanoma excision  . Colonoscopy  2008    small hyperplastic polyps, small int hem, early diverticula ([place]) rpt 10 yrs  . Dilation and curettage of uterus  1999  . Cardiovascular stress test (cpet)  2015    Low Risk Study, but evidence of early Myocardial Dysfunction  . Dilatation & currettage/hysteroscopy with resectocope N/A 06/02/2013    Procedure: DILATATION & CURETTAGE/HYSTEROSCOPY WITH RESECTOCOPE;  Surgeon: Jamey Reas de Berton Lan, MD;  Location: [facility];  Service: Gynecology;  Laterality: N/A;  . US echocardiography  05/2013    WNL, likely microvascular disease  . Cardiovascular stress test  08/2014    ETT - low risk, good exercise tolerance [name])   Family History  Problem Relation Age of Onset  . Coronary artery disease Father 16    MI?  . Other Mother 40    pulm aneurysm  . Stroke Maternal Grandmother   . Hypertension Sister   . Coronary artery disease Brother 82    s/p CABG  . Heart disease Sister 43    CHF, deceased  . Cancer Neg Hx   . Diabetes Neg Hx    Social History  Substance Use Topics  . Smoking  status: Former Smoker    Quit date: 04/09/1992  . Smokeless tobacco: Never Used     Comment: quit 20 years ago  . Alcohol Use: 2.0 oz/week    4 Standard drinks or equivalent per week     Comment: 4 glasses of wine a week   OB History    Gravida Para Term Preterm AB TAB SAB Ectopic Multiple Living   2 2 0 1 0 0 0 0 1 3      Review of Systems  Constitutional: Negative for fever, chills, diaphoresis and fatigue.  HENT: Negative for congestion, rhinorrhea and sneezing.   Eyes: Negative.   Respiratory: Positive for chest tightness and shortness of breath. Negative for cough.    Cardiovascular: Positive for palpitations. Negative for chest pain and leg swelling.  Gastrointestinal: Negative for nausea, vomiting, abdominal pain, diarrhea and blood in stool.  Genitourinary: Negative for frequency, hematuria, flank pain and difficulty urinating.  Musculoskeletal: Negative for back pain and arthralgias.  Skin: Negative for rash.  Neurological: Negative for dizziness, speech difficulty, weakness, numbness and headaches.      Allergies  Review of patient's allergies indicates no known allergies.  Home Medications   Prior to Admission medications   Medication Sig Start Date End Date Taking? Authorizing Provider  ALPRAZolam Duanne Moron) 0.5 MG tablet Take 0.5 tablets (0.25 mg total) by mouth at bedtime as needed for anxiety. 04/05/15   Ria Bush, MD  Ascorbic Acid (VITAMIN C) 1000 MG tablet Take by mouth daily.     Historical Provider, MD  Estradiol Acetate 0.05 MG/24HR RING Place 1 each vaginally every 3 (three) months. 07/21/14   Brook Oletta Lamas, MD  NON FORMULARY Tumeric    Historical Provider, MD  Omega-3 Fatty Acids (FISH OIL) 1000 MG CAPS Take by mouth daily.    Historical Provider, MD  pravastatin (PRAVACHOL) 20 MG tablet Take 1 tablet (20 mg total) by mouth every evening. 10/05/14   Leonie Man, MD  progesterone (PROMETRIUM) 100 MG capsule Take 1 capsule (100 mg total) by mouth daily. 07/21/14   [place], MD  Vitamin D, Ergocalciferol, (DRISDOL) 50000 UNITS CAPS capsule TAKE 1 CAPSULE EVERY OTHER WEEK 03/18/15   Ria Bush, MD   BP 138/77 mmHg  Pulse 58  Temp(Src) 97.8 F (36.6 C) (Oral)  Resp 16  Ht 5\' 5"  (1.651 m)  Wt 159 lb (72.122 kg)  BMI 26.46 kg/m2  SpO2 100%  LMP 12/15/2011 Physical Exam  Constitutional: She is oriented to person, place, and time. She appears well-developed and well-nourished.  HENT:  Head: Normocephalic and atraumatic.  Eyes: Pupils are equal, round, and reactive to light.  Neck: Normal  range of motion. Neck supple.  Cardiovascular: Normal rate, regular rhythm and normal heart sounds.   Pulmonary/Chest: Effort normal and breath sounds normal. No respiratory distress. She has no wheezes. She has no rales. She exhibits no tenderness.  Abdominal: Soft. Bowel sounds are normal. There is no tenderness. There is no rebound and no guarding.  Musculoskeletal: Normal range of motion. She exhibits no edema.  No edema or calf tenderness  Lymphadenopathy:    She has no cervical adenopathy.  Neurological: She is alert and oriented to person, place, and time.  Skin: Skin is warm and dry. No rash noted.  Psychiatric: She has a normal mood and affect.    ED Course  Procedures (including critical care time) Labs Review Results for orders placed or performed during the hospital encounter of  05/31/15  CBC  Result Value Ref Range   WBC 6.0 4.0 - 10.5 K/uL   RBC 4.34 3.87 - 5.11 MIL/uL   Hemoglobin 13.5 12.0 - 15.0 g/dL   HCT 41.4 36.0 - 46.0 %   MCV 95.4 78.0 - 100.0 fL   MCH 31.1 26.0 - 34.0 pg   MCHC 32.6 30.0 - 36.0 g/dL   RDW 12.3 11.5 - 15.5 %   Platelets 233 150 - 400 K/uL  Comprehensive metabolic panel  Result Value Ref Range   Sodium 139 135 - 145 mmol/L   Potassium 4.0 3.5 - 5.1 mmol/L   Chloride 106 101 - 111 mmol/L   CO2 28 22 - 32 mmol/L   Glucose, Bld 90 65 - 99 mg/dL   BUN 14 6 - 20 mg/dL   Creatinine, Ser 0.70 0.44 - 1.00 mg/dL   Calcium 8.5 (L) 8.9 - 10.3 mg/dL   Total Protein 6.7 6.5 - 8.1 g/dL   Albumin 4.2 3.5 - 5.0 g/dL   AST 19 15 - 41 U/L   ALT 14 14 - 54 U/L   Alkaline Phosphatase 57 38 - 126 U/L   Total Bilirubin 0.7 0.3 - 1.2 mg/dL   GFR calc non Af Amer >60 >60 mL/min   GFR calc Af Amer >60 >60 mL/min   Anion gap 5 5 - 15  Troponin I  Result Value Ref Range   Troponin I <0.03 <0.031 ng/mL   Dg Chest 2 View  05/31/2015  CLINICAL DATA:  Heart palpitations since yesterday, history melanoma, coronary artery disease, former smoker, hyperlipidemia  EXAM: CHEST  2 VIEW COMPARISON:  05/09/2005 FINDINGS: Normal heart size, mediastinal contours, and pulmonary vascularity. Lungs clear. No pneumothorax. Bones unremarkable. IMPRESSION: Normal exam. Electronically Signed   By: Lavonia Dana M.D.   On: 05/31/2015 12:05      Imaging Review Dg Chest 2 View  05/31/2015  CLINICAL DATA:  Heart palpitations since yesterday, history melanoma, coronary artery disease, former smoker, hyperlipidemia EXAM: CHEST  2 VIEW COMPARISON:  05/09/2005 FINDINGS: Normal heart size, mediastinal contours, and pulmonary vascularity. Lungs clear. No pneumothorax. Bones unremarkable. IMPRESSION: Normal exam. Electronically Signed   By: Lavonia Dana M.D.   On: 05/31/2015 12:05   I have personally reviewed and evaluated these images and lab results as part of my medical decision-making.   EKG Interpretation   Date/Time:  Tuesday May 31 2015 11:34:56 EST Ventricular Rate:  66 PR Interval:  170 QRS Duration: 82 QT Interval:  404 QTC Calculation: 423 R Axis:   77 Text Interpretation:  Sinus rhythm No old tracing to compare Confirmed by  Curley Fayette  MD, Emory Leaver ([id number]) on 05/31/2015 11:41:18 AM      MDM   Final diagnoses:  Palpitations   Patient presents with palpitations. She does have evidence of a PAC on her EKG but no ischemic changes. She doesn't have any exertional symptoms. She doesn't have any ongoing chest pain or shortness breath. Her symptoms are only related to her palpitations. She was discharged home in good condition. She was encouraged to follow-up with Dr. [name] this week. Return precautions were given.     Malvin Johns, MD 05/31/15 [phone]

## 2015-05-31 NOTE — ED Notes (Signed)
MD at bedside. 

## 2015-05-31 NOTE — ED Notes (Signed)
C/o palpitations since saturdays-denies pain-was advised by PCP office to come to ED-NAD-steady gait

## 2015-05-31 NOTE — Telephone Encounter (Signed)
Per chart review tab pt is at [facility] ED.

## 2015-05-31 NOTE — Telephone Encounter (Signed)
Patient Name: [name] DOB: 02-02-1956 Initial Comment Caller states c/o heart palpitations x 4 days Nurse Assessment Nurse: Vallery Sa, RN, Cathy Date/Time (Eastern Time): 05/31/2015 10:46:52 AM Confirm and document reason for call. If symptomatic, describe symptoms. You must click the next button to save text entered. ---[name] states she developed heart palpitations again about 4 days ago. She states she has had chest pressure on and off the past several days and she feels like she has to catch her breath at times. No injury in the past 4 days. Has the patient traveled out of the country within the last 30 days? ---Yes Where have you traveled? ([place] for [place] and Ebola guideline, [place], [place] for [employer]) ---[place] Does the patient have any new or worsening symptoms? ---Yes Will a triage be completed? ---Yes Related visit to physician within the last 2 weeks? ---Yes Does the PT have any chronic conditions? (i.e. diabetes, asthma, etc.) ---Yes List chronic conditions. ---Heart Palpitations in the past, Chronic sleep problems Is this a behavioral health or substance abuse call? ---No Guidelines Guideline Title Affirmed Question Affirmed Notes Chest Pain [1] Intermittent chest pain or "angina" AND [2] increasing in severity or frequency (Exception: pains lasting a few seconds) Final Disposition User Go to ED Now Vallery Sa, RN, [facility] Referrals [facility] - ED Disagree/Comply: Comply

## 2015-05-31 NOTE — Discharge Instructions (Signed)

## 2015-05-31 NOTE — Telephone Encounter (Signed)
ER note reviewed

## 2015-06-02 ENCOUNTER — Telehealth: Payer: Self-pay | Admitting: Cardiology

## 2015-06-02 NOTE — Telephone Encounter (Signed)
New Message  Pt was sent to ED from PCP on 05/31/15- Per AVS was made appt w/ Ellen Henri for 06/03/15 @ [facility] but wanted to also speak w/ RN- Please call back and discuss.

## 2015-06-02 NOTE — Telephone Encounter (Signed)
Spoke with pt, she is concerned about what she was told in the ER. Questions regarding PAC's and things to avoid answered. She does have an appt tomorrow and explained there are medications that can help with those PAC's and being seen is a good idea. Pt agreed with this plan.

## 2015-06-03 ENCOUNTER — Other Ambulatory Visit: Payer: Self-pay | Admitting: *Deleted

## 2015-06-03 ENCOUNTER — Encounter: Payer: Self-pay | Admitting: Cardiology

## 2015-06-03 ENCOUNTER — Ambulatory Visit (INDEPENDENT_AMBULATORY_CARE_PROVIDER_SITE_OTHER): Payer: Managed Care, Other (non HMO) | Admitting: Cardiology

## 2015-06-03 VITALS — BP 118/80 | HR 78 | Ht 65.0 in | Wt 160.1 lb

## 2015-06-03 DIAGNOSIS — I493 Ventricular premature depolarization: Secondary | ICD-10-CM | POA: Diagnosis not present

## 2015-06-03 DIAGNOSIS — I491 Atrial premature depolarization: Secondary | ICD-10-CM

## 2015-06-03 LAB — TSH: TSH: 2.54 mIU/L

## 2015-06-03 LAB — MAGNESIUM: MAGNESIUM: 1.9 mg/dL (ref 1.5–2.5)

## 2015-06-03 MED ORDER — METOPROLOL TARTRATE 25 MG PO TABS: 12.5000 mg | ORAL_TABLET | Freq: Two times a day (BID) | ORAL | Status: DC
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
## 2015-06-03 NOTE — Patient Instructions (Signed)
Medication Instructions:  START METOPROLOL TARTRATE 25 MG TABLET WITH THE DIRECTIONS TO TAKE 1/2 TAB = 12.5 MG TWICE DAILY; RX SENT IN    Labwork: TODAY MAGNESIUM LEVEL AND TSH  Testing/Procedures: Your physician has recommended that you wear an event monitor. PER BRITTANY SIMMONS, PAC THIS IS TO BE WORN FOR 2 WEEKS. Event monitors are medical devices that record the heart's electrical activity. Doctors most often [place] these monitors to diagnose arrhythmias. Arrhythmias are problems with the speed or rhythm of the heartbeat. The monitor is a small, portable device. You can wear one while you do your normal daily activities. This is usually used to diagnose what is causing palpitations/syncope (passing out).   Follow-Up: KEEP YOUR APPT WITH DR. HARDING AS PLANNED  Any Other Special Instructions Will Be Listed Below (If Applicable).  If you need a refill on your cardiac medications before your next appointment, please call your pharmacy.

## 2015-06-03 NOTE — Telephone Encounter (Signed)
I SENT IN NEW RX FOR FOR 30 DAYS SUPPLY PER PT STATES INSURANCE NEEDS EITHER 30 DYS OR 90 DYS.

## 2015-06-06 ENCOUNTER — Ambulatory Visit (INDEPENDENT_AMBULATORY_CARE_PROVIDER_SITE_OTHER): Payer: Managed Care, Other (non HMO)

## 2015-06-06 DIAGNOSIS — I491 Atrial premature depolarization: Secondary | ICD-10-CM

## 2015-06-06 DIAGNOSIS — I493 Ventricular premature depolarization: Secondary | ICD-10-CM

## 2015-06-28 ENCOUNTER — Telehealth: Payer: Self-pay | Admitting: *Deleted

## 2015-06-28 NOTE — Telephone Encounter (Signed)
LEFT DETAILED MESSAGE WITH RESULTS ON SECURE VOICE MAIL  ANY QUESTION MAY CALL BACK

## 2015-06-28 NOTE — Telephone Encounter (Signed)
-----   Message from Amy Man, MD sent at 06/25/2015  2:34 PM EDT -----     Mostly sinus rhythm with minimum heart rate 52 bpm, maximum heart rate 113 BPM     No evidence of arrhythmias   Essentially normal monitor HARDING, Amy Green, MD

## 2015-07-04 ENCOUNTER — Telehealth: Payer: Self-pay | Admitting: Obstetrics and Gynecology

## 2015-07-04 NOTE — Telephone Encounter (Signed)
Patient called she said she requested a refill of her femring from her [place] mail order but they are saying she needs to have a prior auth done. Best # to reach patient: [phone]

## 2015-07-04 NOTE — Telephone Encounter (Signed)
Prior authorization for Femring 0.05 mg 1 ring per 90 days sent to [facility]. Covermymeds Key ID [id number]  Femring and Provera ordered by Dr. Joan Flores 04/29/12 due to vasomotor symptoms and insomnia. No other HRT options used in record.

## 2015-07-05 NOTE — Telephone Encounter (Signed)
Routing to Dr. Quincy Simmonds to review.  Patient has annual exam 08/03/15 with Dr. Quincy Simmonds.   Okay to proceed with prior authorization?

## 2015-07-05 NOTE — Telephone Encounter (Signed)
OK to proceed with prior authorization for Femring. Thank you.

## 2015-07-05 NOTE — Telephone Encounter (Signed)
Call to patient. She is advised that message was received and that request for coverage was made to [facility] yesterday. Patient with recent cardiology visit and normal Holter Monitoring. Patient states no new cardiac concerns.  Patient needs new ring 07/08/16.  Advised will call [employer] to ensure they received request.

## 2015-07-06 NOTE — Telephone Encounter (Signed)
Call to [facility]. Patient last picked up prescription on 04/09/15. [facility] states it is getting a quantity limit override as patient may be attempting to pick up prescription too early. Next fill can be processed no earlier than 07/07/15.  Patient notified. She is advised to call [facility] tomorrow and request that refill be processed. She is advised to call our office with any issues with refill, patient agrees. Routing to provider for final review. Patient agreeable to disposition. Will close encounter.

## 2015-08-03 ENCOUNTER — Ambulatory Visit (INDEPENDENT_AMBULATORY_CARE_PROVIDER_SITE_OTHER): Payer: Managed Care, Other (non HMO) | Admitting: Obstetrics and Gynecology

## 2015-08-03 ENCOUNTER — Encounter: Payer: Self-pay | Admitting: Obstetrics and Gynecology

## 2015-08-03 VITALS — BP 122/76 | HR 64 | Resp 14 | Ht 65.0 in | Wt 158.2 lb

## 2015-08-03 DIAGNOSIS — Z79890 Hormone replacement therapy (postmenopausal): Secondary | ICD-10-CM | POA: Diagnosis not present

## 2015-08-03 DIAGNOSIS — Z01419 Encounter for gynecological examination (general) (routine) without abnormal findings: Secondary | ICD-10-CM

## 2015-08-03 DIAGNOSIS — Z113 Encounter for screening for infections with a predominantly sexual mode of transmission: Secondary | ICD-10-CM

## 2015-08-03 DIAGNOSIS — Z1151 Encounter for screening for human papillomavirus (HPV): Secondary | ICD-10-CM | POA: Diagnosis not present

## 2015-08-03 LAB — POCT URINALYSIS DIPSTICK
BILIRUBIN UA: NEGATIVE
Glucose, UA: NEGATIVE
KETONES UA: NEGATIVE
LEUKOCYTES UA: NEGATIVE
Nitrite, UA: NEGATIVE
PROTEIN UA: NEGATIVE
RBC UA: NEGATIVE
Urobilinogen, UA: NEGATIVE

## 2015-08-03 NOTE — Progress Notes (Signed)
Patient ID: [name], female   DOB: 03-20-1956, 60 y.o.   MRN: [id number] 60 y.o. [id number] Married Caucasian female here for annual exam.    Struggling with her weight.   Having heart palpitations and lasted for 2 - 3 weeks. Wore a Holter monitor - had PACs.  Did not take Metroprolol.  Hx of microvascular disease of heart.  FH of CAD. Had miserable hot flashes before she started on the HRT medication.   Minor GSI with sneeze.   Insomnia problems.  Ambien did not work well in the past.  PCP also prescribed Xanax which helps.  Melatonin caused her to feel wakeful.  PCP:   Ria Bush, MD  Patient's last menstrual period was 12/15/2011.           Sexually active: Yes.   female The current method of family planning is tubal ligation.    Exercising: Yes.    walks 3 days/week 2 miles/day/ Smoker:  no  Health Maintenance: Pap:  07-21-14 Neg:Neg HR HPV.  Wants a pap today.  History of abnormal Pap:  Yes.  Remote hx of abnormal pap in past.  No treatment.  MMG:  07-20-14 Density Cat.C/asymmetry Rt.Breast;Lt.Br.neg--Rt.Diag.3D/Density C/focal area in Rt.Br. on prior mammogram is not reproduced;presumably superimposed breast tissue/Neg/BiRads2:[facility].  She will schedule. Colonoscopy:  2008 benign polyps;next due 2018 with Dr. Collene Mares. BMD:  2008  Result  normal TDaP:  2012 Gardasil:   N/A HIV: today Hep C:Drawn today Screening Labs:  Hb today: PCP, Urine today: Neg   reports that she quit smoking about 23 years ago. She has never used smokeless tobacco. She reports that she drinks about 2.4 oz of alcohol per week. She reports that she does not use illicit drugs.  Past Medical History  Diagnosis Date  . History of chicken pox   . Heart murmur     PVCs thought from MVP -- reportedly had echocardiogram in 2005 at [facility]; follow-up Echo February 2015: Normal LV size and function. EF 60-65% with no RWMA, no aortic stenosis, trivial MR with no suggestion of prolapse,  .  Anxiety   . History of colon polyps   . Diverticulosis     by colonosocpy  . Vitamin D deficiency   . HLD (hyperlipidemia)     mild  . CAD (coronary artery disease)     CPET Test suggests mild potential ischemic findings - likely Microvascular; Normal LV function on Echo, Overall Good score of CPET  . Malignant melanoma ([place]) 1988    L thigh, stage 3, yearly skin checks with Dr .[place]ique at [facility]    Past Surgical History  Procedure Laterality Date  . Mandible surgery  1987  . Cesarean section  1982 and 1989  . Tubal ligation  1998  . Skin surgery  1988    melanoma excision  . Colonoscopy  2008    small hyperplastic polyps, small int hem, early diverticula ([place]) rpt 10 yrs  . Dilation and curettage of uterus  1999  . Cardiovascular stress test (cpet)  2015    Low Risk Study, but evidence of early Myocardial Dysfunction  . Dilatation & currettage/hysteroscopy with resectocope N/A 06/02/2013    Procedure: DILATATION & CURETTAGE/HYSTEROSCOPY WITH RESECTOCOPE;  Surgeon: Jamey Reas de Berton Lan, MD;  Location: [facility];  Service: Gynecology;  Laterality: N/A;  . US echocardiography  05/2013    WNL, likely microvascular disease  . Cardiovascular stress test  08/2014    ETT - low  risk, good exercise tolerance [name])    Current Outpatient Prescriptions  Medication Sig Dispense Refill  . ALPRAZolam (XANAX) 0.5 MG tablet Take 0.5 tablets (0.25 mg total) by mouth at bedtime as needed for anxiety. 30 tablet 0  . Ascorbic Acid (VITAMIN C) 1000 MG tablet Take by mouth daily.     . Estradiol Acetate 0.05 MG/24HR RING Place 1 each vaginally every 3 (three) months. 1 each 3  . NON FORMULARY Take 1 capsule by mouth daily. Tumeric    . Omega-3 Fatty Acids (FISH OIL) 1000 MG CAPS Take by mouth daily.    . pravastatin (PRAVACHOL) 20 MG tablet Take 1 tablet (20 mg total) by mouth every evening. 90 tablet 3  . progesterone (PROMETRIUM) 100 MG capsule Take 1 capsule (100 mg total) by  mouth daily. 90 capsule 3  . Vitamin D, Ergocalciferol, (DRISDOL) 50000 UNITS CAPS capsule TAKE 1 CAPSULE EVERY OTHER WEEK 6 capsule 3   No current facility-administered medications for this visit.    Family History  Problem Relation Age of Onset  . Coronary artery disease Father 30    MI?  . Other Mother 65    pulm aneurysm  . Stroke Maternal Grandmother   . Hypertension Sister   . Coronary artery disease Brother 52    s/p CABG  . Heart disease Sister 22    CHF, deceased  . Cancer Neg Hx   . Diabetes Neg Hx     ROS:  Pertinent items are noted in HPI.  Otherwise, a comprehensive ROS was negative.  Exam:   BP 122/76 mmHg  Pulse 64  Resp 14  Ht 5\' 5"  (1.651 m)  Wt 158 lb 3.2 oz (71.759 kg)  BMI 26.33 kg/m2  LMP 12/15/2011    General appearance: alert, cooperative and appears stated age Head: Normocephalic, without obvious abnormality, atraumatic Neck: no adenopathy, supple, symmetrical, trachea midline and thyroid normal to inspection and palpation Lungs: clear to auscultation bilaterally Breasts: normal appearance, no masses or tenderness, Inspection negative, No nipple retraction or dimpling, No nipple discharge or bleeding, No axillary or supraclavicular adenopathy Heart: regular rate and rhythm Abdomen: incisions:  Yes.    Vertical midline , soft, non-tender; no masses, no organomegaly Extremities: extremities normal, atraumatic, no cyanosis or edema Skin: Skin color, texture, turgor normal. No rashes or lesions Lymph nodes: Cervical, supraclavicular, and axillary nodes normal. No abnormal inguinal nodes palpated Neurologic: Grossly normal  Pelvic: External genitalia:  no lesions              Urethra:  normal appearing urethra with no masses, tenderness or lesions              Bartholins and Skenes: normal                 Vagina: normal appearing vagina with normal color and discharge, no lesions              Cervix: no lesions              Pap taken: Yes.    Bimanual Exam:  Uterus:  normal size, contour, position, consistency, mobility, non-tender              Adnexa: normal adnexa and no mass, fullness, tenderness              Rectal exam: Yes.  .  Confirms.              Anus:  normal sphincter tone, no lesions  Chaperone was present for exam.  Assessment:   Well woman visit with normal exam. HRT patient.  Cardiac palpitations, microvascular cardiac disease,and FH of CAD.  Sleep disturbance.   Plan: Yearly mammogram recommended after age 53.  Patient will call to schedule. Recommended self breast exam.  Pap and HR HPV as above. Recommendations for Calcium, Vitamin D, regular exercise program including cardiovascular and weight bearing exercise. Labs performed.  Yes.  .   See orders. HIV and Hep C. Prescription medication(s) given.  No..   Currently on Prometrium and Femring.   We discussed risks of DVT, PE, MI, and stroke.   Note sent to cardiologist Dr. [name] to get opinion about continuation or not on HRT. Increase exercise to help with sleep.  If sleep disturbance continues, to sleep specialist - neurology.  Follow up annually and prn.       After visit summary provided.

## 2015-08-03 NOTE — Patient Instructions (Signed)

## 2015-08-04 LAB — HIV ANTIBODY (ROUTINE TESTING W REFLEX): HIV 1&2 Ab, 4th Generation: NONREACTIVE

## 2015-08-04 LAB — HEPATITIS C ANTIBODY: HCV Ab: NEGATIVE

## 2015-08-08 LAB — IPS PAP TEST WITH HPV

## 2015-08-15 ENCOUNTER — Telehealth: Payer: Self-pay | Admitting: Obstetrics and Gynecology

## 2015-08-15 ENCOUNTER — Other Ambulatory Visit: Payer: Self-pay | Admitting: Obstetrics and Gynecology

## 2015-08-15 MED ORDER — PROGESTERONE MICRONIZED 100 MG PO CAPS: 100.0000 mg | ORAL_CAPSULE | Freq: Every day | ORAL | Status: DC

## 2015-08-15 MED ORDER — FLUCONAZOLE 150 MG PO TABS: 150.0000 mg | ORAL_TABLET | Freq: Once | ORAL | Status: DC

## 2015-08-15 MED ORDER — METRONIDAZOLE 500 MG PO TABS: 500.0000 mg | ORAL_TABLET | Freq: Two times a day (BID) | ORAL | Status: DC

## 2015-08-15 NOTE — Telephone Encounter (Signed)
Return call to patient. States she is calling for instructions on weaning off HRT after discussion with cardiologist. Currently on [place] which was just inserted on 07-09-15 and Prometrium. Patient asking how Dr Quincy Simmonds wants her to wean off of these medications. Patient states she is also calling to get medication for BV and yeast noted on Pap smear. [place]

## 2015-08-15 NOTE — Telephone Encounter (Signed)
An additional Rx for Prometrium 100 mg daily sent to patient's pharmacy for another 60 days worth of the Femring to finish this prescription of Femring and HRT.

## 2015-08-15 NOTE — Telephone Encounter (Signed)
Patient calling to speak with nurse about a medication that she is supposed to stop taking. She also thought something was going to be called in for a yeast infection.

## 2015-08-15 NOTE — Telephone Encounter (Signed)
I sent an Rx to her pharmacy for: -  Diflucan 150 gm po x 1.  May repeat in 72 hours if needed.  #2, RF none.  -  Flagyl 500 mg po bid for 7 days.  #14, RF none.   I would recommend finishing the current Femring and continue with Prometrium 100 g daily throughout the rest of this Femring.  She can then stop the Femring and Prometrium together.

## 2015-08-16 NOTE — Telephone Encounter (Signed)
Left message to call Tarrin Menn at [phone]. 

## 2015-08-16 NOTE — Telephone Encounter (Signed)
Spoke with patient. Advised of messages as seen below from [place]. She is agreeable and verbalizes understanding. ETOH precautions given while taking Flagyl. All prescriptions were sent to the pharmacy by Dr.Silva. She is agreeable and verbalizes understanding.  Routing to provider for final review. Patient agreeable to disposition. Will close encounter.

## 2015-10-18 ENCOUNTER — Other Ambulatory Visit: Payer: Self-pay | Admitting: Cardiology

## 2015-11-08 DIAGNOSIS — H5210 Myopia, unspecified eye: Secondary | ICD-10-CM | POA: Diagnosis not present

## 2015-11-16 DIAGNOSIS — Z1231 Encounter for screening mammogram for malignant neoplasm of breast: Secondary | ICD-10-CM | POA: Diagnosis not present

## 2016-01-04 ENCOUNTER — Telehealth: Payer: Self-pay | Admitting: *Deleted

## 2016-01-04 DIAGNOSIS — E785 Hyperlipidemia, unspecified: Secondary | ICD-10-CM

## 2016-01-04 DIAGNOSIS — Z8249 Family history of ischemic heart disease and other diseases of the circulatory system: Secondary | ICD-10-CM

## 2016-01-04 DIAGNOSIS — I519 Heart disease, unspecified: Secondary | ICD-10-CM

## 2016-01-04 NOTE — Telephone Encounter (Signed)
-----   Message from Raiford Simmonds, RN sent at 02/09/2015  3:49 PM EDT ----- CARDIO IQ 01/31/16 MAIL IN SEPT 2017

## 2016-01-04 NOTE — Telephone Encounter (Signed)
Sent letter by Deloris Ping to patient for cardio IQ ,ADVANCE LIPID PANEL

## 2016-02-10 ENCOUNTER — Ambulatory Visit (INDEPENDENT_AMBULATORY_CARE_PROVIDER_SITE_OTHER): Payer: Managed Care, Other (non HMO)

## 2016-02-10 DIAGNOSIS — Z23 Encounter for immunization: Secondary | ICD-10-CM | POA: Diagnosis not present

## 2016-03-11 ENCOUNTER — Other Ambulatory Visit: Payer: Self-pay | Admitting: Family Medicine

## 2016-03-11 DIAGNOSIS — E785 Hyperlipidemia, unspecified: Secondary | ICD-10-CM

## 2016-03-11 DIAGNOSIS — E559 Vitamin D deficiency, unspecified: Secondary | ICD-10-CM

## 2016-03-13 ENCOUNTER — Other Ambulatory Visit (INDEPENDENT_AMBULATORY_CARE_PROVIDER_SITE_OTHER): Payer: Managed Care, Other (non HMO)

## 2016-03-13 DIAGNOSIS — E559 Vitamin D deficiency, unspecified: Secondary | ICD-10-CM | POA: Diagnosis not present

## 2016-03-13 DIAGNOSIS — E785 Hyperlipidemia, unspecified: Secondary | ICD-10-CM

## 2016-03-13 LAB — BASIC METABOLIC PANEL
BUN: 17 mg/dL (ref 7–25)
CHLORIDE: 104 mmol/L (ref 98–110)
CO2: 27 mmol/L (ref 20–31)
Calcium: 9 mg/dL (ref 8.6–10.4)
Creat: 0.65 mg/dL (ref 0.50–1.05)
GLUCOSE: 89 mg/dL (ref 65–99)
POTASSIUM: 4.4 mmol/L (ref 3.5–5.3)
SODIUM: 139 mmol/L (ref 135–146)

## 2016-03-13 LAB — LIPID PANEL
CHOLESTEROL: 176 mg/dL (ref <200)
HDL: 95 mg/dL (ref >50)
LDL Cholesterol: 70 mg/dL (ref <100)
Total CHOL/HDL Ratio: 1.9 Ratio (ref <5.0)
Triglycerides: 55 mg/dL (ref <150)
VLDL: 11 mg/dL (ref <30)

## 2016-03-13 NOTE — Addendum Note (Signed)
Addended by: Ellamae Sia on: 03/13/2016 09:16 AM   Modules accepted: Orders

## 2016-03-14 LAB — VITAMIN D 25 HYDROXY (VIT D DEFICIENCY, FRACTURES): VIT D 25 HYDROXY: 37 ng/mL (ref 30–100)

## 2016-03-20 ENCOUNTER — Encounter: Payer: 59 | Admitting: Family Medicine

## 2016-03-22 ENCOUNTER — Encounter: Payer: Self-pay | Admitting: Family Medicine

## 2016-03-22 ENCOUNTER — Ambulatory Visit (INDEPENDENT_AMBULATORY_CARE_PROVIDER_SITE_OTHER): Payer: Managed Care, Other (non HMO) | Admitting: Family Medicine

## 2016-03-22 VITALS — BP 136/88 | HR 80 | Temp 97.7°F | Ht 64.5 in | Wt 162.5 lb

## 2016-03-22 DIAGNOSIS — G5600 Carpal tunnel syndrome, unspecified upper limb: Secondary | ICD-10-CM | POA: Insufficient documentation

## 2016-03-22 DIAGNOSIS — E559 Vitamin D deficiency, unspecified: Secondary | ICD-10-CM

## 2016-03-22 DIAGNOSIS — E785 Hyperlipidemia, unspecified: Secondary | ICD-10-CM | POA: Diagnosis not present

## 2016-03-22 DIAGNOSIS — E663 Overweight: Secondary | ICD-10-CM

## 2016-03-22 DIAGNOSIS — Z0000 Encounter for general adult medical examination without abnormal findings: Secondary | ICD-10-CM

## 2016-03-22 DIAGNOSIS — G5601 Carpal tunnel syndrome, right upper limb: Secondary | ICD-10-CM | POA: Diagnosis not present

## 2016-03-22 DIAGNOSIS — R69 Illness, unspecified: Secondary | ICD-10-CM | POA: Diagnosis not present

## 2016-03-22 DIAGNOSIS — Z8249 Family history of ischemic heart disease and other diseases of the circulatory system: Secondary | ICD-10-CM | POA: Diagnosis not present

## 2016-03-22 DIAGNOSIS — I2510 Atherosclerotic heart disease of native coronary artery without angina pectoris: Secondary | ICD-10-CM

## 2016-03-22 DIAGNOSIS — F5104 Psychophysiologic insomnia: Secondary | ICD-10-CM | POA: Diagnosis not present

## 2016-03-22 MED ORDER — VITAMIN D3 25 MCG (1000 UT) PO CAPS: 1.0000 | ORAL_CAPSULE | Freq: Every day | ORAL | Status: DC

## 2016-03-22 MED ORDER — ALPRAZOLAM 0.5 MG PO TABS: 0.2500 mg | ORAL_TABLET | Freq: Every evening | ORAL | 3 refills | Status: DC | PRN

## 2016-03-22 MED ORDER — DOXEPIN HCL 3 MG PO TABS: 3.0000 mg | ORAL_TABLET | Freq: Every day | ORAL | 3 refills | Status: DC

## 2016-03-22 MED ORDER — TOPIRAMATE 50 MG PO TABS: 25.0000 mg | ORAL_TABLET | Freq: Two times a day (BID) | ORAL | 0 refills | Status: DC

## 2016-03-22 NOTE — Assessment & Plan Note (Signed)
BMI >27 with comorbidities - pt interested in pharmacotherapy, discussed options. rec against any stimulants given cardiac history. Will trial topamax with slow titration to 50mg  bid. Discussed monitoring for paresthesias and word finding difficulty. RTC 1 mo f/u visit.

## 2016-03-22 NOTE — Progress Notes (Signed)
BP 136/88   Pulse 80   Temp 97.7 F (36.5 C) (Oral)   Ht 5' 4.5" (1.638 m)   Wt 162 lb 8 oz (73.7 kg)   LMP 12/15/2011   BMI 27.46 kg/m    CC: CPE Subjective:    Patient ID: [name], female    DOB: 01-Nov-1955, 60 y.o.   MRN: [id number]  HPI: [name] is a 60 y.o. female presenting on 03/22/2016 for Annual Exam   Microvascular ischemia CAD followed by Dr [name]. Strong fmhx CAD.   Chronic sleep maintenance insomnia - silenor was not covered by insurance. Willing to try again. Prescribed xanax 0.5mg  1/2 tab nightly prn by Dr Kathline Magic prior PCP. This had worked well. Ambien was not helpful, felt fog entire next day. More trouble with sleep maintenance insomnia. Tried and failed benadryl and melatonin.   L knee pain over last 3 weeks since she started running. Injured while running. Ongoing discomfort. Ibuprofen somewhat helpful. No locking in place of knee. Some instability.   Endorsing some CTS sxs R wrist - tried wrist brace but unable to tolerate.   Preventative: Well woman with OBGYN Dr. Josefa Half - April 2017. Off HRT. Natural remedies were not helpful.  COLONOSCOPY Date: 2008 small hyperplastic polyps, small int hem, early diverticula Collene Mares) rpt 10 yrs Mammogram - 11/2015 WNL, [facility] Flu shot yearly Tdap - 2012 Seat belt [place] discussed.  Sunscreen use discussed. H/o melanoma, followed regularly by derm.  Non smoker - husband smokes outside Alcohol - 4 glasses of wine a week  Caffeine: 2 cups coffee/day  Lives with husband, 2 dogs.  Grown children  Occupation: Museum/gallery exhibitions officer Activity: walks 3 mi 3x/wk (1 hour)  Diet: good water, daily fruits/vegetables, red meat 1x/wk, fish seldom   Relevant past medical, surgical, family and social history reviewed and updated as indicated. Interim medical history since our last visit reviewed. Allergies and medications reviewed and updated. Current Outpatient Prescriptions on File Prior to Visit  Medication Sig  .  Ascorbic Acid (VITAMIN C) 1000 MG tablet Take by mouth daily.   . NON FORMULARY Take 1 capsule by mouth daily. Tumeric  . pravastatin (PRAVACHOL) 20 MG tablet TAKE 1 TABLET (20 MG TOTAL) BY MOUTH EVERY EVENING.  . Vitamin D, Ergocalciferol, (DRISDOL) 50000 UNITS CAPS capsule TAKE 1 CAPSULE EVERY OTHER WEEK (Patient not taking: Reported on 03/22/2016)   No current facility-administered medications on file prior to visit.     Review of Systems  Constitutional: Negative for activity change, appetite change, chills, fatigue, fever and unexpected weight change.  HENT: Negative for hearing loss.   Eyes: Negative for visual disturbance.  Respiratory: Negative for cough, chest tightness, shortness of breath and wheezing.   Cardiovascular: Negative for chest pain, palpitations and leg swelling.  Gastrointestinal: Negative for abdominal distention, abdominal pain, blood in stool, constipation, diarrhea, nausea and vomiting.  Genitourinary: Negative for difficulty urinating and hematuria.  Musculoskeletal: Negative for arthralgias, myalgias and neck pain.  Skin: Negative for rash.  Neurological: Negative for dizziness, seizures, syncope and headaches.  Hematological: Negative for adenopathy. Does not bruise/bleed easily.  Psychiatric/Behavioral: Negative for dysphoric mood. The patient is not nervous/anxious.    Per HPI unless specifically indicated in ROS section     Objective:    BP 136/88   Pulse 80   Temp 97.7 F (36.5 C) (Oral)   Ht 5' 4.5" (1.638 m)   Wt 162 lb 8 oz (73.7 kg)   LMP 12/15/2011  BMI 27.46 kg/m   Wt Readings from Last 3 Encounters:  03/22/16 162 lb 8 oz (73.7 kg)  08/03/15 158 lb 3.2 oz (71.8 kg)  06/03/15 160 lb 1.9 oz (72.6 kg)    Physical Exam  Constitutional: She is oriented to person, place, and time. She appears well-developed and well-nourished. No distress.  HENT:  Head: Normocephalic and atraumatic.  Right Ear: Hearing, tympanic membrane, external ear  and ear canal normal.  Left Ear: Hearing, tympanic membrane, external ear and ear canal normal.  Nose: Nose normal.  Mouth/Throat: Uvula is midline, oropharynx is clear and moist and mucous membranes are normal. No oropharyngeal exudate, posterior oropharyngeal edema or posterior oropharyngeal erythema.  Eyes: Conjunctivae and EOM are normal. Pupils are equal, round, and reactive to light. No scleral icterus.  Neck: Normal range of motion. Neck supple. No thyromegaly present.  Cardiovascular: Normal rate, regular rhythm, normal heart sounds and intact distal pulses.   No murmur heard. Pulses:      Radial pulses are 2+ on the right side, and 2+ on the left side.  Pulmonary/Chest: Effort normal and breath sounds normal. No respiratory distress. She has no wheezes. She has no rales.  Abdominal: Soft. Bowel sounds are normal. She exhibits no distension and no mass. There is no tenderness. There is no rebound and no guarding.  Musculoskeletal: Normal range of motion. She exhibits no edema.  Lymphadenopathy:    She has no cervical adenopathy.  Neurological: She is alert and oriented to person, place, and time.  CN grossly intact, station and gait intact Bilateral tinel positive, R phalen positive.   Skin: Skin is warm and dry. No rash noted.  Psychiatric: She has a normal mood and affect. Her behavior is normal. Judgment and thought content normal.  Nursing note and vitals reviewed.  Results for orders placed or performed in visit on 03/13/16  VITAMIN D 25 Hydroxy (Vit-D Deficiency, Fractures)  Result Value Ref Range   Vit D, 25-Hydroxy 37 30 - 100 ng/mL  Lipid panel  Result Value Ref Range   Cholesterol 176 <200 mg/dL   Triglycerides 55 <150 mg/dL   HDL 95 >50 mg/dL   Total CHOL/HDL Ratio 1.9 <5.0 Ratio   VLDL 11 <30 mg/dL   LDL Cholesterol 70 <100 mg/dL  Basic metabolic panel  Result Value Ref Range   Sodium 139 135 - 146 mmol/L   Potassium 4.4 3.5 - 5.3 mmol/L   Chloride 104 98 -  110 mmol/L   CO2 27 20 - 31 mmol/L   Glucose, Bld 89 65 - 99 mg/dL   BUN 17 7 - 25 mg/dL   Creat 0.65 0.50 - 1.05 mg/dL   Calcium 9.0 8.6 - 10.4 mg/dL      Assessment & Plan:   Problem List Items Addressed This Visit    CAD (coronary artery disease)    Thought microvascular - on daily statin.      Chronic insomnia    Sleep maintenance insomnia.  Price out silenor. If not affordable, will restart PRN xanax. Discussed use sparingly as habit forming.       CTS (carpal tunnel syndrome)    rec nightly wrist brace trial. Update if persistent to discuss hand surgery referral. Pt agrees with plan.      Relevant Medications   ALPRAZolam (XANAX) 0.5 MG tablet   Doxepin HCl (SILENOR) 3 MG TABS   topiramate (TOPAMAX) 50 MG tablet   Family history of early CAD (Chronic)   HLD (hyperlipidemia) (Chronic)  Chronic, improved. Continue pravastatin. Discussed recommend lifelong treatment      Overweight (BMI 25.0-29.9)    BMI >27 with comorbidities - pt interested in pharmacotherapy, discussed options. rec against any stimulants given cardiac history. Will trial topamax with slow titration to 50mg  bid. Discussed monitoring for paresthesias and word finding difficulty. RTC 1 mo f/u visit.       Routine health maintenance - Primary    Preventative protocols reviewed and updated unless pt declined. Discussed healthy diet and lifestyle.       Vitamin D deficiency    Completed weekly supplementation. rec start 1000 IU daily.          Follow up plan: Return in about 1 month (around 04/22/2016) for follow up visit.  Ria Bush, MD

## 2016-03-22 NOTE — Assessment & Plan Note (Addendum)
Thought microvascular - on daily statin.

## 2016-03-22 NOTE — Assessment & Plan Note (Signed)
Preventative protocols reviewed and updated unless pt declined. Discussed healthy diet and lifestyle.  

## 2016-03-22 NOTE — Assessment & Plan Note (Signed)
Sleep maintenance insomnia.  Price out silenor. If not affordable, will restart PRN xanax. Discussed use sparingly as habit forming.

## 2016-03-22 NOTE — Progress Notes (Signed)
Pre visit review using our clinic review tool, if applicable. No additional management support is needed unless otherwise documented below in the visit note. 

## 2016-03-22 NOTE — Assessment & Plan Note (Signed)
Completed weekly supplementation. rec start 1000 IU daily.

## 2016-03-22 NOTE — Patient Instructions (Addendum)
Price out silenor. If not covered, ok to take xanaxx 1/2- 1 tablet at bedtime as needed (printed out today).  Trial topamax 1/2 tablet twice daily for 1 week then increase to 1 tablet twice daily for appetite suppression.  Return in 1 month for follow up on topamax.   Sleep hygiene checklist: 1. Avoid naps during the day 2. Avoid stimulants such as caffeine and nicotine. Avoid bedtime alcohol (it can speed onset of sleep but the body's metabolism can cause awakenings). 3. All forms of exercise help ensure sound sleep - limit vigorous exercise to morning or late afternoon 4. Avoid food too close to bedtime including chocolate (which contains caffeine) 5. Soak up natural light 6. Establish regular bedtime routine. 7. Associate bed with sleep - avoid TV, computer or phone, reading while in bed. 8. Ensure pleasant, relaxing sleep environment - quiet, dark, cool room.

## 2016-03-22 NOTE — Assessment & Plan Note (Signed)
Chronic, improved. Continue pravastatin. Discussed recommend lifelong treatment

## 2016-03-22 NOTE — Assessment & Plan Note (Signed)
rec nightly wrist brace trial. Update if persistent to discuss hand surgery referral. Pt agrees with plan.

## 2016-04-05 ENCOUNTER — Telehealth: Payer: Self-pay

## 2016-04-05 NOTE — Telephone Encounter (Signed)
Pt left v/m; pt was seen for annual exam on 03/22/16. Pt was prescribed the topamax for appetite suppression. Pt cannot tolerate the topamax in the morning and is only taking at bedtime. Pt wants to know if taking only once a day should be effective for appetite suppressant. Pt also said ins will not approve silenor. Pharmacist advised pt that ins will approve the generic for silenor. Pt request cb. [facility].

## 2016-04-06 MED ORDER — DOXEPIN HCL 3 MG PO TABS: 3.0000 mg | ORAL_TABLET | Freq: Every day | ORAL | 3 refills | Status: DC

## 2016-04-06 NOTE — Telephone Encounter (Signed)
Ok to take topamax 25mg  nightly. Would see if she can titrate up to 50mg  nightly, if not ok to continue 25mg  and will see how she does. Have sent in generic silenor to pharmacy.

## 2016-04-06 NOTE — Telephone Encounter (Signed)
Patient notified and verbalized understanding. 

## 2016-04-18 DIAGNOSIS — D2272 Melanocytic nevi of left lower limb, including hip: Secondary | ICD-10-CM | POA: Diagnosis not present

## 2016-04-18 DIAGNOSIS — D225 Melanocytic nevi of trunk: Secondary | ICD-10-CM | POA: Diagnosis not present

## 2016-04-18 DIAGNOSIS — D2271 Melanocytic nevi of right lower limb, including hip: Secondary | ICD-10-CM | POA: Diagnosis not present

## 2016-04-18 DIAGNOSIS — Z85820 Personal history of malignant melanoma of skin: Secondary | ICD-10-CM | POA: Diagnosis not present

## 2016-04-25 ENCOUNTER — Ambulatory Visit: Payer: Managed Care, Other (non HMO) | Admitting: Family Medicine

## 2016-04-27 ENCOUNTER — Ambulatory Visit: Payer: Managed Care, Other (non HMO) | Admitting: Family Medicine

## 2016-05-03 ENCOUNTER — Encounter: Payer: Self-pay | Admitting: Family Medicine

## 2016-05-03 ENCOUNTER — Ambulatory Visit (INDEPENDENT_AMBULATORY_CARE_PROVIDER_SITE_OTHER): Payer: Managed Care, Other (non HMO) | Admitting: Family Medicine

## 2016-05-03 VITALS — BP 148/82 | HR 76 | Temp 98.1°F | Wt 161.0 lb

## 2016-05-03 DIAGNOSIS — E785 Hyperlipidemia, unspecified: Secondary | ICD-10-CM | POA: Diagnosis not present

## 2016-05-03 DIAGNOSIS — Z8249 Family history of ischemic heart disease and other diseases of the circulatory system: Secondary | ICD-10-CM

## 2016-05-03 DIAGNOSIS — I2510 Atherosclerotic heart disease of native coronary artery without angina pectoris: Secondary | ICD-10-CM

## 2016-05-03 DIAGNOSIS — E663 Overweight: Secondary | ICD-10-CM | POA: Diagnosis not present

## 2016-05-03 DIAGNOSIS — F5104 Psychophysiologic insomnia: Secondary | ICD-10-CM

## 2016-05-03 MED ORDER — NALTREXONE-BUPROPION HCL ER 8-90 MG PO TB12: ORAL_TABLET | ORAL | 0 refills | Status: DC

## 2016-05-03 MED ORDER — DOXEPIN HCL 3 MG PO TABS: 3.0000 mg | ORAL_TABLET | Freq: Every day | ORAL | 3 refills | Status: DC

## 2016-05-03 NOTE — Assessment & Plan Note (Signed)
BMI >27 with comorbidity. topamax ineffective, will taper off. Discussed other options - pt interested in trial of contrave. Discussed slow taper, common side effects, mechanism of action.  RTC 1 mo f/u visit

## 2016-05-03 NOTE — Assessment & Plan Note (Signed)
Printed silenor Rx to take to pharmacy and price out.

## 2016-05-03 NOTE — Progress Notes (Signed)
Pre visit review using our clinic review tool, if applicable. No additional management support is needed unless otherwise documented below in the visit note. 

## 2016-05-03 NOTE — Patient Instructions (Addendum)
Let's taper off topamax - decrease to 1/2 tablet at night for 1 week then stop.  Let's start contrave 1 tablet nightly for 1 week then increase to twice daily for 1 week then increase to 1 in am and 2 in pm for 1 week then take two tablets twice daily Price out silenor (doxepin) Return in 1 month for follow up visit.

## 2016-05-03 NOTE — Progress Notes (Signed)
   BP (!) 148/82   Pulse 76   Temp 98.1 F (36.7 C) (Oral)   Wt 161 lb (73 kg)   LMP 12/15/2011   BMI 27.21 kg/m    CC: 59mo f/u visit Subjective:    Patient ID: [name], female    DOB: 1955/05/05, 61 y.o.   MRN: [id number]  HPI: [name] is a 61 y.o. female presenting on 05/03/2016 for Follow-up   See prior note for details. Seen here last month for CPE.   Overweight - started on topamax for appetite with slow titration to 50mg  BID.   Sleep maintenance insomnia - silenor was not affordable. Tried doxepin generic - pharmacy never received despite multiple time sent. She picked up xanax - but hasn't taken yet. Has tried and failed several other medications as well as sleep hygiene measures.   Relevant past medical, surgical, family and social history reviewed and updated as indicated. Interim medical history since our last visit reviewed. Allergies and medications reviewed and updated. Current Outpatient Prescriptions on File Prior to Visit  Medication Sig  . ALPRAZolam (XANAX) 0.5 MG tablet Take 0.5 tablets (0.25 mg total) by mouth at bedtime as needed for sleep.  . Ascorbic Acid (VITAMIN C) 1000 MG tablet Take by mouth daily.   . Cholecalciferol (VITAMIN D3) 1000 units CAPS Take 1 capsule (1,000 Units total) by mouth daily.  . NON FORMULARY Take 1 capsule by mouth daily. Tumeric  . pravastatin (PRAVACHOL) 20 MG tablet TAKE 1 TABLET (20 MG TOTAL) BY MOUTH EVERY EVENING.   No current facility-administered medications on file prior to visit.     Review of Systems Per HPI unless specifically indicated in ROS section     Objective:    BP (!) 148/82   Pulse 76   Temp 98.1 F (36.7 C) (Oral)   Wt 161 lb (73 kg)   LMP 12/15/2011   BMI 27.21 kg/m   Wt Readings from Last 3 Encounters:  05/03/16 161 lb (73 kg)  03/22/16 162 lb 8 oz (73.7 kg)  08/03/15 158 lb 3.2 oz (71.8 kg)    Physical Exam  Constitutional: She appears well-developed and well-nourished. No  distress.  Psychiatric: She has a normal mood and affect.  Nursing note and vitals reviewed.     Assessment & Plan:   Problem List Items Addressed This Visit    CAD (coronary artery disease)   Chronic insomnia    Printed silenor Rx to take to pharmacy and price out.      Family history of early CAD (Chronic)   HLD (hyperlipidemia) (Chronic)   Overweight (BMI 25.0-29.9) - Primary    BMI >27 with comorbidity. topamax ineffective, will taper off. Discussed other options - pt interested in trial of contrave. Discussed slow taper, common side effects, mechanism of action.  RTC 1 mo f/u visit          Follow up plan: No Follow-up on file.  Amy Bush, MD

## 2016-05-16 ENCOUNTER — Telehealth: Payer: Self-pay

## 2016-05-16 NOTE — Telephone Encounter (Signed)
Noted  

## 2016-05-16 NOTE — Telephone Encounter (Signed)
[name] with [facility] left v/m that the alprazolam was filled incorrectly as taking 1 tab at hs prn instead of 1/2 tab at hs prn. [facility]

## 2016-06-04 ENCOUNTER — Encounter: Payer: Self-pay | Admitting: Family Medicine

## 2016-06-04 ENCOUNTER — Ambulatory Visit (INDEPENDENT_AMBULATORY_CARE_PROVIDER_SITE_OTHER): Payer: Managed Care, Other (non HMO) | Admitting: Family Medicine

## 2016-06-04 VITALS — BP 144/60 | HR 72 | Temp 98.1°F | Wt 159.5 lb

## 2016-06-04 DIAGNOSIS — E663 Overweight: Secondary | ICD-10-CM

## 2016-06-04 DIAGNOSIS — F5104 Psychophysiologic insomnia: Secondary | ICD-10-CM

## 2016-06-04 DIAGNOSIS — H6592 Unspecified nonsuppurative otitis media, left ear: Secondary | ICD-10-CM | POA: Diagnosis not present

## 2016-06-04 DIAGNOSIS — R69 Illness, unspecified: Secondary | ICD-10-CM | POA: Diagnosis not present

## 2016-06-04 MED ORDER — NALTREXONE-BUPROPION HCL ER 8-90 MG PO TB12: 2.0000 | ORAL_TABLET | Freq: Two times a day (BID) | ORAL | 0 refills | Status: DC

## 2016-06-04 NOTE — Progress Notes (Signed)
BP (!) 144/60   Pulse 72   Temp 98.1 F (36.7 C) (Oral)   Wt 159 lb 8 oz (72.3 kg)   LMP 12/15/2011   BMI 26.96 kg/m    CC: 1 mo f/u visit Subjective:    Patient ID: [name], female    DOB: 1955-11-09, 61 y.o.   MRN: [id number]  HPI: [name] is a 61 y.o. female presenting on 06/04/2016 for Follow-up   See prior notes for details.   Overweight - topamax 50mg  bid was ineffective, so we tapered off. Last visit we started contrave - she has been taking . No chest pain, headaches, abd pain, nausea.   Sleep maintenance insomnia - trial silenor 3mg  - but persistent awakenings at bedtime. Xanax has helped the most.   After airplane ride last week - ongoing muffled hearing of L ear - with tinnitus.  Relevant past medical, surgical, family and social history reviewed and updated as indicated. Interim medical history since our last visit reviewed. Allergies and medications reviewed and updated. Outpatient Medications Prior to Visit  Medication Sig Dispense Refill  . ALPRAZolam (XANAX) 0.5 MG tablet Take 0.5 tablets (0.25 mg total) by mouth at bedtime as needed for sleep. 30 tablet 3  . Ascorbic Acid (VITAMIN C) 1000 MG tablet Take by mouth daily.     . Cholecalciferol (VITAMIN D3) 1000 units CAPS Take 1 capsule (1,000 Units total) by mouth daily. 30 capsule   . Doxepin HCl (SILENOR) 3 MG TABS Take 1 tablet (3 mg total) by mouth at bedtime. Use generic 30 tablet 3  . NON FORMULARY Take 1 capsule by mouth daily. Tumeric    . pravastatin (PRAVACHOL) 20 MG tablet TAKE 1 TABLET (20 MG TOTAL) BY MOUTH EVERY EVENING. 90 tablet 3  . Naltrexone-Bupropion HCl ER (CONTRAVE) 8-90 MG TB12 Take one tablet nightly for 1 week, then twice daily for 1 week, then 1 in am 2 in pm for 1 week then two twice daily (Patient taking differently: Take 2 tablets by mouth 2 (two) times daily. Take one tablet nightly for 1 week, then twice daily for 1 week, then 1 in am 2 in pm for 1 week then two twice  daily) 120 tablet 0   No facility-administered medications prior to visit.      Per HPI unless specifically indicated in ROS section below Review of Systems     Objective:    BP (!) 144/60   Pulse 72   Temp 98.1 F (36.7 C) (Oral)   Wt 159 lb 8 oz (72.3 kg)   LMP 12/15/2011   BMI 26.96 kg/m   Wt Readings from Last 3 Encounters:  06/04/16 159 lb 8 oz (72.3 kg)  05/03/16 161 lb (73 kg)  03/22/16 162 lb 8 oz (73.7 kg)    Physical Exam  Constitutional: She appears well-developed and well-nourished. No distress.  HENT:  Right Ear: Tympanic membrane and ear canal normal.  Left Ear: Ear canal normal.  Mouth/Throat: Oropharynx is clear and moist. No oropharyngeal exudate.  Cerumen removed from L canal Dull TM on left noted  Cardiovascular: Normal rate, regular rhythm, normal heart sounds and intact distal pulses.   No murmur heard. Pulmonary/Chest: Effort normal and breath sounds normal. No respiratory distress. She has no wheezes. She has no rales.  Musculoskeletal: She exhibits no edema.  Skin: Skin is warm and dry. No rash noted.  Psychiatric: She has a normal mood and affect.  Nursing note and vitals  reviewed.     Assessment & Plan:   Problem List Items Addressed This Visit    Chronic insomnia - Primary    silenor somewhat effective but with ongoing night time awakenings ~Q2hrs. rec trial 6mg  silenor, if ineffective, will return to xanax. Pt notes xanax only temporarily effective.       Left serous otitis media    Anticipate due to ETD after recent flight. No significant improvement with cerumen removal from ear.  rec INS x 1 month. Reassess at f/u visit. Pt agrees with plan.      Overweight (BMI 25.0-29.9)    2lb weight loss noted. Pt just started full dose last week. Will continue full dose contrave x 1 month, then consider d/c med. F/u 6 wks.           Follow up plan: Return in about 6 weeks (around 07/16/2016) for follow up visit.  Ria Bush, MD

## 2016-06-04 NOTE — Patient Instructions (Signed)
Try silenor 6mg  at bedtime. Continue contrave for the next month 2 tablets twice daily.  Return in 1-2 months for follow up visit.

## 2016-06-04 NOTE — Assessment & Plan Note (Signed)
Anticipate due to ETD after recent flight. No significant improvement with cerumen removal from ear.  rec INS x 1 month. Reassess at f/u visit. Pt agrees with plan.

## 2016-06-04 NOTE — Assessment & Plan Note (Addendum)
silenor somewhat effective but with ongoing night time awakenings ~Q2hrs. rec trial 6mg  silenor, if ineffective, will return to xanax. Pt notes xanax only temporarily effective.

## 2016-06-04 NOTE — Progress Notes (Signed)
Pre visit review using our clinic review tool, if applicable. No additional management support is needed unless otherwise documented below in the visit note. 

## 2016-06-04 NOTE — Assessment & Plan Note (Signed)
2lb weight loss noted. Pt just started full dose last week. Will continue full dose contrave x 1 month, then consider d/c med. F/u 6 wks.

## 2016-07-01 ENCOUNTER — Other Ambulatory Visit: Payer: Self-pay | Admitting: Family Medicine

## 2016-08-09 ENCOUNTER — Telehealth: Payer: Self-pay | Admitting: Cardiology

## 2016-08-09 NOTE — Telephone Encounter (Signed)
Weight gain is NOT a know side effect of this medication. Know to decrease appetite and not the opposite.   Most recent LDL was 70mg /dL and will increase if medication discontinued.  Recommendation against stopping the medication at this time.

## 2016-08-09 NOTE — Telephone Encounter (Signed)
Left msg to call.

## 2016-08-09 NOTE — Telephone Encounter (Signed)
Spoke with pt states that she would like to stop pravastatin she states that she thinks it is making her gain weight she would like to stop and try to control cholesterol with her diet. Does she have to titrate down to stop or can she just stop?

## 2016-08-09 NOTE — Telephone Encounter (Signed)
New message      Patient wants to stop taking this medication   pravastatin (PRAVACHOL) 20 MG tablet TAKE 1 TABLET (20 MG TOTAL) BY MOUTH EVERY EVENING.   She just does not want to take anymore

## 2016-08-09 NOTE — Telephone Encounter (Signed)
Follow up     Missed [name]  Call please call back thank you

## 2016-08-10 NOTE — Telephone Encounter (Signed)
LEFT MESSAGE TO CALL BACK

## 2016-08-13 NOTE — Progress Notes (Signed)
61 y.o. G80P0103 Married Caucasian female here for annual exam.    Having hot flashes.  Off HRT.  Herbal options not helpful. Notes weight gain.   Loss of urine with cough and sneeze and with running.   PCP:  Ria Bush, MD   Patient's last menstrual period was 12/15/2011.           Sexually active: Yes.   female The current method of family planning is tubal ligation.    Exercising: Yes.    Walks 2 miles/3days per week. Just started crossfit Smoker:  no  Health Maintenance: Pap: 08-03-15 Neg:Neg HR HPV; 07-21-14 Neg:Neg HR HPV History of abnormal Pap:  no MMG: 11-16-15 3D Density B/Neg/BiRads1:[facility] Colonoscopy:  2008 benign polyps with Dr. Lenise Herald due 2018. BMD:   n/a  Result  n/a TDaP:  PCP Gardasil:   no HIV: 08-03-15 Neg Hep C: 08-03-15 Neg Screening Labs:  Hb today: PCP, Urine today: not done   reports that she quit smoking about 24 years ago. She has never used smokeless tobacco. She reports that she drinks about 2.4 oz of alcohol per week . She reports that she does not use drugs.  Past Medical History:  Diagnosis Date  . Anxiety   . CAD (coronary artery disease)    CPET Test suggests mild potential ischemic findings - likely Microvascular; Normal LV function on Echo, Overall Good score of CPET  . Diverticulosis    by colonosocpy  . Heart murmur    PVCs thought from MVP -- reportedly had echocardiogram in 2005 at [facility]; follow-up Echo February 2015: Normal LV size and function. EF 60-65% with no RWMA, no aortic stenosis, trivial MR with no suggestion of prolapse,  . History of chicken pox   . History of colon polyps   . HLD (hyperlipidemia)    mild  . Malignant melanoma ([place]) 1988   L thigh, stage 3, yearly skin checks with Dr .Martinique at [facility]  . Vitamin D deficiency     Past Surgical History:  Procedure Laterality Date  . CARDIOVASCULAR STRESS TEST  08/2014   ETT - low risk, good exercise tolerance [name])  . CARDIOVASCULAR STRESS  TEST (CPET)  2015   Low Risk Study, but evidence of early Myocardial Dysfunction  . [place]  . COLONOSCOPY  2008   small hyperplastic polyps, small int hem, early diverticula ([place]) rpt 10 yrs  . DILATATION & CURRETTAGE/HYSTEROSCOPY WITH RESECTOCOPE N/A 06/02/2013   Procedure: DILATATION & CURETTAGE/HYSTEROSCOPY WITH RESECTOCOPE;  Surgeon: Jamey Reas de Berton Lan, MD;  Location: [facility];  Service: Gynecology;  Laterality: N/A;  . DILATION AND CURETTAGE OF UTERUS  1999  . [place]  . SKIN SURGERY  1988   melanoma excision  . TUBAL LIGATION  1998  . US ECHOCARDIOGRAPHY  05/2013   WNL, likely microvascular disease    Current Outpatient Prescriptions  Medication Sig Dispense Refill  . ALPRAZolam (XANAX) 0.5 MG tablet Take 0.5 tablets (0.25 mg total) by mouth at bedtime as needed for sleep. 30 tablet 3  . Ascorbic Acid (VITAMIN C) 1000 MG tablet Take by mouth daily.     . NON FORMULARY Take 1 capsule by mouth daily. Tumeric    . pravastatin (PRAVACHOL) 20 MG tablet TAKE 1 TABLET (20 MG TOTAL) BY MOUTH EVERY EVENING. 90 tablet 3   No current facility-administered medications for this visit.     Family History  Problem Relation Age of  Onset  . Coronary artery disease Father 57    MI?  . Other Mother 63    pulm aneurysm  . Stroke Maternal Grandmother   . Hypertension Sister   . Coronary artery disease Brother 84    s/p CABG  . Heart disease Sister 106    CHF, deceased  . Cancer Neg Hx   . Diabetes Neg Hx     ROS:  Pertinent items are noted in HPI.  Otherwise, a comprehensive ROS was negative.  Exam:   BP 138/80 (BP Location: Right Arm, Patient Position: Sitting, Cuff Size: Normal)   Pulse 64   Resp 14   Ht 5\' 5"  (1.651 m)   Wt 163 lb 3.2 oz (74 kg)   LMP 12/15/2011   BMI 27.16 kg/m     General appearance: alert, cooperative and appears stated age Head: Normocephalic, without obvious abnormality, atraumatic Neck: no  adenopathy, supple, symmetrical, trachea midline and thyroid normal to inspection and palpation Lungs: clear to auscultation bilaterally Breasts: normal appearance, no masses or tenderness, No nipple retraction or dimpling, No nipple discharge or bleeding, No axillary or supraclavicular adenopathy Heart: regular rate and rhythm Abdomen: soft, non-tender; no masses, no organomegaly Extremities: extremities normal, atraumatic, no cyanosis or edema Skin: Skin color, texture, turgor normal. No rashes or lesions Lymph nodes: Cervical, supraclavicular, and axillary nodes normal. No abnormal inguinal nodes palpated Neurologic: Grossly normal  Pelvic: External genitalia:  no lesions              Urethra:  normal appearing urethra with no masses, tenderness or lesions              Bartholins and Skenes: normal                 Vagina: normal appearing vagina with normal color and discharge, no lesions              Cervix: no lesions              Pap taken: No. Bimanual Exam:  Uterus:  normal size, contour, position, consistency, mobility, non-tender              Adnexa: no mass, fullness, tenderness              Rectal exam: Yes.  .  Confirms.              Anus:  normal sphincter tone, no lesions  Chaperone was present for exam.  Assessment:   Well woman visit with normal exam. Cardiac palpitations, microvascular cardiac disease,and FH of CAD.  Menopausal symptoms. Sleep disturbance. Weight gain.  Stress incontinence.  Plan: Mammogram screening discussed. Recommended self breast awareness. Pap and HR HPV as above. Guidelines for Calcium, Vitamin D, regular exercise program including cardiovascular and weight bearing exercise. Declines tx for stress incontinence. Effexor XR 37.5 mg daily.  Discussed potential side effects of decreased libido, weight change, and serotonin syndrome. Follow up in 6 weeks for a med recheck.  Follow up annually and prn.   After visit summary provided.

## 2016-08-14 NOTE — Telephone Encounter (Signed)
[id number] 

## 2016-08-15 ENCOUNTER — Ambulatory Visit (INDEPENDENT_AMBULATORY_CARE_PROVIDER_SITE_OTHER): Payer: Managed Care, Other (non HMO) | Admitting: Obstetrics and Gynecology

## 2016-08-15 ENCOUNTER — Encounter: Payer: Self-pay | Admitting: Obstetrics and Gynecology

## 2016-08-15 VITALS — BP 138/80 | HR 64 | Resp 14 | Ht 65.0 in | Wt 163.2 lb

## 2016-08-15 DIAGNOSIS — Z01419 Encounter for gynecological examination (general) (routine) without abnormal findings: Secondary | ICD-10-CM | POA: Diagnosis not present

## 2016-08-15 DIAGNOSIS — N951 Menopausal and female climacteric states: Secondary | ICD-10-CM | POA: Diagnosis not present

## 2016-08-15 MED ORDER — VENLAFAXINE HCL ER 37.5 MG PO CP24: 37.5000 mg | ORAL_CAPSULE | Freq: Every day | ORAL | 2 refills | Status: DC

## 2016-08-15 NOTE — Telephone Encounter (Signed)
Pt returning call please call her back

## 2016-08-15 NOTE — Telephone Encounter (Signed)
[id number] 

## 2016-08-15 NOTE — Telephone Encounter (Signed)
Pt notified she will continue medicaton

## 2016-08-15 NOTE — Patient Instructions (Signed)
Venlafaxine extended-release capsules  What is this medicine?  VENLAFAXINE(VEN la fax een) is used to treat depression, anxiety and panic disorder.  This medicine may be used for other purposes; ask your health care provider or pharmacist if you have questions.  COMMON BRAND NAME(S): Effexor XR  What should I tell my health care provider before I take this medicine?  They need to know if you have any of these conditions:  -bleeding disorders  -glaucoma  -heart disease  -high blood pressure  -high cholesterol  -kidney disease  -liver disease  -low levels of sodium in the blood  -mania or bipolar disorder  -seizures  -suicidal thoughts, plans, or attempt; a previous suicide attempt by you or a family  -take medicines that treat or prevent blood clots  -thyroid disease  -an unusual or allergic reaction to venlafaxine, desvenlafaxine, other medicines, foods, dyes, or preservatives  -pregnant or trying to get pregnant  -breast-feeding  How should I use this medicine?  Take this medicine by mouth with a full glass of water. Follow the directions on the prescription label. Do not cut, crush, or chew this medicine. Take it with food. If needed, the capsule may be carefully opened and the entire contents sprinkled on a spoonful of cool applesauce. Swallow the applesauce/pellet mixture right away without chewing and follow with a glass of water to ensure complete swallowing of the pellets. Try to take your medicine at about the same time each day. Do not take your medicine more often than directed. Do not stop taking this medicine suddenly except upon the advice of your doctor. Stopping this medicine too quickly may cause serious side effects or your condition may worsen.  A special MedGuide will be given to you by the pharmacist with each prescription and refill. Be sure to read this information carefully each time.  Talk to your pediatrician regarding the use of this medicine in children. Special care may be  needed.  Overdosage: If you think you have taken too much of this medicine contact a poison control center or emergency room at once.  NOTE: This medicine is only for you. Do not share this medicine with others.  What if I miss a dose?  If you miss a dose, take it as soon as you can. If it is almost time for your next dose, take only that dose. Do not take double or extra doses.  What may interact with this medicine?  Do not take this medicine with any of the following medications:  -certain medicines for fungal infections like fluconazole, itraconazole, ketoconazole, posaconazole, voriconazole  -cisapride  -desvenlafaxine  -dofetilide  -dronedarone  -duloxetine  -levomilnacipran  -linezolid  -MAOIs like Carbex, Eldepryl, Marplan, Nardil, and Parnate  -methylene blue (injected into a vein)  -milnacipran  -pimozide  -thioridazine  -ziprasidone  This medicine may also interact with the following medications:  -amphetamines  -aspirin and aspirin-like medicines  -certain medicines for depression, anxiety, or psychotic disturbances  -certain medicines for migraine headaches like almotriptan, eletriptan, frovatriptan, naratriptan, rizatriptan, sumatriptan, zolmitriptan  -certain medicines for sleep  -certain medicines that treat or prevent blood clots like dalteparin, enoxaparin, warfarin  -cimetidine  -clozapine  -diuretics  -fentanyl  -furazolidone  -indinavir  -isoniazid  -lithium  -metoprolol  -NSAIDS, medicines for pain and inflammation, like ibuprofen or naproxen  -other medicines that prolong the QT interval (cause an abnormal heart rhythm)  -procarbazine  -rasagiline  -supplements like St. John's wort, kava kava, valerian  -tramadol  -tryptophan    worse. Visit your doctor or health care professional for regular checks on your progress. Because it may take several weeks to see the full effects of this medicine, it is important to continue your treatment as prescribed by your doctor. Patients and their families should watch out for new or worsening thoughts of suicide or depression. Also watch out for sudden changes in feelings such as feeling anxious, agitated, panicky, irritable, hostile, aggressive, impulsive, severely restless, overly excited and hyperactive, or not being able to sleep. If this happens, especially at the beginning of treatment or after a change in dose, call your health care professional. This medicine can cause an increase in blood pressure. Check with your doctor for instructions on monitoring your blood pressure while taking this medicine. You may get drowsy or dizzy. Do not drive, use machinery, or do anything that needs mental alertness until you know how this medicine affects you. Do not stand or sit up quickly, especially if you are an older patient. This reduces the risk of dizzy or fainting spells. Alcohol may interfere with the effect of this medicine. Avoid alcoholic drinks. Your mouth may get dry. Chewing sugarless gum, sucking hard candy and drinking plenty of water will help. Contact your doctor if the problem does not go away or is severe. What side effects may I notice from receiving this medicine? Side effects that you should report to your doctor or health care professional as soon as possible: -allergic reactions like skin rash, itching or hives, swelling of the face, lips, or tongue -anxious -breathing problems -confusion -changes in vision -chest pain -confusion -elevated mood, decreased need for sleep, racing thoughts, impulsive behavior -eye pain -fast, irregular  heartbeat -feeling faint or lightheaded, falls -feeling agitated, angry, or irritable -hallucination, loss of contact with reality -high blood pressure -loss of balance or coordination -palpitations -redness, blistering, peeling or loosening of the skin, including inside the mouth -restlessness, pacing, inability to keep still -seizures -stiff muscles -suicidal thoughts or other mood changes -trouble passing urine or change in the amount of urine -trouble sleeping -unusual bleeding or bruising -unusually weak or tired -vomiting Side effects that usually do not require medical attention (report to your doctor or health care professional if they continue or are bothersome): -change in sex drive or performance -change in appetite or weight -constipation -dizziness -dry mouth -headache -increased sweating -nausea -tired This list may not describe all possible side effects. Call your doctor for medical advice about side effects. You may report side effects to FDA at 1-800-FDA-1088. Where should I keep my medicine? Keep out of the reach of children. Store at a controlled temperature between 20 and 25 degrees C (68 degrees and 77 degrees F), in a dry place. Throw away any unused medicine after the expiration date. NOTE: This sheet is a summary. It may not cover all possible information. If you have questions about this medicine, talk to your doctor, pharmacist, or health care provider.  2018 Elsevier/Gold Standard (2015-08-25 18:38:02)  

## 2016-08-15 NOTE — Telephone Encounter (Signed)
Follow up    Pt is calling returning call.

## 2016-09-18 ENCOUNTER — Telehealth: Payer: Self-pay | Admitting: Obstetrics and Gynecology

## 2016-09-18 NOTE — Telephone Encounter (Signed)
Patient canceled appointment for reck. She states she did not take the medication. She has decided to not start on this medication.

## 2016-09-18 NOTE — Telephone Encounter (Signed)
Patient ws given rx for Effexor XR 37.5 mg at her appointment on 08/15/2016 for menopausal symptoms. Did not take medication and has decided she does not wish to start it.  Routing to provider for final review. Patient agreeable to disposition. Will close encounter.

## 2016-09-28 ENCOUNTER — Ambulatory Visit: Payer: Managed Care, Other (non HMO) | Admitting: Obstetrics and Gynecology

## 2016-11-09 ENCOUNTER — Other Ambulatory Visit: Payer: Self-pay | Admitting: Cardiology

## 2016-11-27 ENCOUNTER — Other Ambulatory Visit: Payer: Self-pay | Admitting: Cardiology

## 2016-12-27 ENCOUNTER — Ambulatory Visit (INDEPENDENT_AMBULATORY_CARE_PROVIDER_SITE_OTHER): Payer: Managed Care, Other (non HMO) | Admitting: Cardiology

## 2016-12-27 ENCOUNTER — Encounter: Payer: Self-pay | Admitting: Cardiology

## 2016-12-27 VITALS — BP 138/76 | HR 68 | Ht 65.0 in | Wt 158.0 lb

## 2016-12-27 DIAGNOSIS — I2510 Atherosclerotic heart disease of native coronary artery without angina pectoris: Secondary | ICD-10-CM | POA: Diagnosis not present

## 2016-12-27 DIAGNOSIS — Z79899 Other long term (current) drug therapy: Secondary | ICD-10-CM | POA: Diagnosis not present

## 2016-12-27 DIAGNOSIS — Z8249 Family history of ischemic heart disease and other diseases of the circulatory system: Secondary | ICD-10-CM | POA: Diagnosis not present

## 2016-12-27 DIAGNOSIS — E784 Other hyperlipidemia: Secondary | ICD-10-CM

## 2016-12-27 DIAGNOSIS — E7849 Other hyperlipidemia: Secondary | ICD-10-CM
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
## 2016-12-27 NOTE — Patient Instructions (Addendum)
LABS -  CMP CARD-IQ  - FASTING      Your physician wants you to follow-up in [place].You will receive a reminder letter in the mail two months in advance. If you don't receive a letter, please call our office to schedule the follow-up appointment.   If you need a refill on your cardiac medications before your next appointment, please call your pharmacy.

## 2016-12-28 ENCOUNTER — Other Ambulatory Visit: Payer: Self-pay | Admitting: Cardiology

## 2016-12-29 NOTE — Assessment & Plan Note (Signed)
No diagnosis truly of coronary disease besides from her CPX a few years ago. She has significant family history with therefore treating her risk factors. We will check lipid panel since she is now on statin. I would like to tachycardia like you to see where we are standing. She has normal blood pressure and is very active. We talked about checking coronary calcium score and possible coronary CTA at her next follow-up for additional screening. This would let us know how aggressive we need to be with her lipids. Otherwise I recommend she continue to stay active with exercise. We need to watch her blood pressure to make sure it is not going up.

## 2016-12-29 NOTE — Assessment & Plan Note (Signed)
Unfortunately, don't have labs to check now. We will order a CMP and cardio like you panel in order to delineate her lipids. She will continue pravastatin for now.

## 2016-12-29 NOTE — Assessment & Plan Note (Signed)
She herself does not have any anginal symptoms. She is exercising and not noticing any significant exertional dyspnea. Next evaluation would be to consider coronary CTA/calcium score.

## 2017-01-09 ENCOUNTER — Other Ambulatory Visit: Payer: Self-pay | Admitting: Cardiology

## 2017-01-09 LAB — COMPLETE METABOLIC PANEL WITH GFR
AG RATIO: 1.9 (calc) (ref 1.0–2.5)
ALKALINE PHOSPHATASE (APISO): 65 U/L (ref 33–130)
ALT: 13 U/L (ref 6–29)
AST: 15 U/L (ref 10–35)
Albumin: 4.2 g/dL (ref 3.6–5.1)
BILIRUBIN TOTAL: 0.6 mg/dL (ref 0.2–1.2)
BUN: 14 mg/dL (ref 7–25)
CALCIUM: 9.3 mg/dL (ref 8.6–10.4)
CO2: 26 mmol/L (ref 20–32)
CREATININE: 0.64 mg/dL (ref 0.50–0.99)
Chloride: 106 mmol/L (ref 98–110)
GFR, EST NON AFRICAN AMERICAN: 97 mL/min/{1.73_m2} (ref >=60)
GFR, Est African American: 112 mL/min/{1.73_m2} (ref >=60)
GLOBULIN: 2.2 g/dL (ref 1.9–3.7)
Glucose, Bld: 88 mg/dL (ref 65–99)
Potassium: 3.9 mmol/L (ref 3.5–5.3)
Sodium: 141 mmol/L (ref 135–146)
Total Protein: 6.4 g/dL (ref 6.1–8.1)

## 2017-01-13 LAB — CARDIO IQ(R) ADVANCED LIPID PANEL
Apolipoprotein B: 84 mg/dL (ref 49–103)
CHOL/HDL RATIO: 2.4 calc (ref <5.0)
CHOLESTEROL: 231 mg/dL — AB (ref <200)
HDL: 96 mg/dL (ref >50)
LDL Cholesterol (Calc): 118 mg/dL — ABNORMAL HIGH (ref <100)
LDL LARGE: 6707 nmol/L (ref 3966–11938)
LDL MEDIUM: 220 nmol/L (ref 122–498)
LDL Particle Number: 1160 nmol/L (ref 732–2035)
LDL Peak Size: 228 Angstrom (ref >=217)
LDL SMALL: 146 nmol/L (ref 75–452)
Lipoprotein (a): 11 nmol/L (ref <75)
NON-HDL CHOLESTEROL (CALC): 135 mg/dL — AB (ref <130)
Triglycerides: 73 mg/dL (ref <150)

## 2017-02-01 ENCOUNTER — Ambulatory Visit: Payer: Managed Care, Other (non HMO)

## 2017-02-06 DIAGNOSIS — Z23 Encounter for immunization: Secondary | ICD-10-CM | POA: Diagnosis not present

## 2017-03-18 ENCOUNTER — Other Ambulatory Visit: Payer: Managed Care, Other (non HMO)

## 2017-03-18 ENCOUNTER — Other Ambulatory Visit: Payer: Self-pay | Admitting: Family Medicine

## 2017-03-18 DIAGNOSIS — E7849 Other hyperlipidemia: Secondary | ICD-10-CM

## 2017-03-18 DIAGNOSIS — E559 Vitamin D deficiency, unspecified: Secondary | ICD-10-CM

## 2017-03-25 ENCOUNTER — Encounter: Payer: Self-pay | Admitting: Family Medicine

## 2017-03-25 ENCOUNTER — Ambulatory Visit (INDEPENDENT_AMBULATORY_CARE_PROVIDER_SITE_OTHER): Payer: Managed Care, Other (non HMO) | Admitting: Family Medicine

## 2017-03-25 VITALS — BP 122/80 | HR 71 | Temp 98.4°F | Ht 64.5 in | Wt 158.0 lb

## 2017-03-25 DIAGNOSIS — E7849 Other hyperlipidemia: Secondary | ICD-10-CM | POA: Diagnosis not present

## 2017-03-25 DIAGNOSIS — Z0000 Encounter for general adult medical examination without abnormal findings: Secondary | ICD-10-CM | POA: Diagnosis not present

## 2017-03-25 DIAGNOSIS — Z8249 Family history of ischemic heart disease and other diseases of the circulatory system: Secondary | ICD-10-CM | POA: Diagnosis not present

## 2017-03-25 DIAGNOSIS — R69 Illness, unspecified: Secondary | ICD-10-CM | POA: Diagnosis not present

## 2017-03-25 DIAGNOSIS — E663 Overweight: Secondary | ICD-10-CM | POA: Diagnosis not present

## 2017-03-25 DIAGNOSIS — F5104 Psychophysiologic insomnia: Secondary | ICD-10-CM

## 2017-03-25 MED ORDER — ALPRAZOLAM 0.5 MG PO TABS: 0.2500 mg | ORAL_TABLET | Freq: Every evening | ORAL | 3 refills | Status: DC | PRN

## 2017-03-25 NOTE — Assessment & Plan Note (Addendum)
Discussed with patient. Melatonin effective. Will also continue xanax PRN.

## 2017-03-25 NOTE — Assessment & Plan Note (Signed)
Chronic, stable. Continue pravastatin. Thought microvascular CAD. The 10-year ASCVD risk score Mikey Bussing DC Brooke Bonito., et al., 2013) is: 2.3%   Values used to calculate the score:     Age: 61 years     Sex: Female     Is Non-Hispanic African American: No     Diabetic: No     Tobacco smoker: No     Systolic Blood Pressure: 182 mmHg     Is BP treated: No     HDL Cholesterol: 96 mg/dL     Total Cholesterol: 231 mg/dL

## 2017-03-25 NOTE — Patient Instructions (Addendum)
Bring [place] copy of your labwork from last month to update your chart.  Pass by lab to pick up stool kit.  Call for mammogram at [facility].  If interested, check with pharmacy about new 2 shot shingles series (shingrix).  You are doing well today.  Return as needed or in 1 year for next physical. Health Maintenance, Female Adopting a healthy lifestyle and getting preventive care can go a long way to promote health and wellness. Talk with your health care provider about what schedule of regular examinations is right for you. This is a good chance for you to check in with your provider about disease prevention and staying healthy. In between checkups, there are plenty of things you can do on your own. Experts have done a lot of research about which lifestyle changes and preventive measures are most likely to keep you healthy. Ask your health care provider for more information. Weight and diet Eat a healthy diet  Be sure to include plenty of vegetables, fruits, low-fat dairy products, and lean protein.  Do not eat a lot of foods high in solid fats, added sugars, or salt.  Get regular exercise. This is one of the most important things you can do for your health. ? Most adults should exercise for at least 150 minutes each week. The exercise should increase your heart rate and make you sweat (moderate-intensity exercise). ? Most adults should also do strengthening exercises at least twice a week. This is in addition to the moderate-intensity exercise.  Maintain a healthy weight  Body mass index (BMI) is a measurement that can be used to identify possible weight problems. It estimates body fat based on height and weight. Your health care provider can help determine your BMI and help you achieve or maintain a healthy weight.  For females 56 years of age and older: ? A BMI below 18.5 is considered underweight. ? A BMI of 18.5 to 24.9 is normal. ? A BMI of 25 to 29.9 is considered overweight. ? A BMI of  30 and above is considered obese.  Watch levels of cholesterol and blood lipids  You should start having your blood tested for lipids and cholesterol at 60 years of age, then have this test every 5 years.  You may need to have your cholesterol levels checked more often if: ? Your lipid or cholesterol levels are high. ? You are older than 60 years of age. ? You are at high risk for heart disease.  Cancer screening Lung Cancer  Lung cancer screening is recommended for adults 39-80 years old who are at high risk for lung cancer because of a history of smoking.  A yearly low-dose CT scan of the lungs is recommended for people who: ? Currently smoke. ? Have quit within the past 15 years. ? Have at least a 30-pack-year history of smoking. A pack year is smoking an average of one pack of cigarettes a day for 1 year.  Yearly screening should continue until it has been 15 years since you quit.  Yearly screening should stop if you develop a health problem that would prevent you from having lung cancer treatment.  Breast Cancer  Practice breast self-awareness. This means understanding how your breasts normally appear and feel.  It also means doing regular breast self-exams. Let your health care provider know about any changes, no matter how small.  If you are in your 20s or 30s, you should have a clinical breast exam (CBE) by a health care  provider every 1-3 years as part of a regular health exam.  If you are 44 or older, have a CBE every year. Also consider having a breast X-ray (mammogram) every year.  If you have a family history of breast cancer, talk to your health care provider about genetic screening.  If you are at high risk for breast cancer, talk to your health care provider about having an MRI and a mammogram every year.  Breast cancer gene (BRCA) assessment is recommended for women who have family members with BRCA-related cancers. BRCA-related cancers  include: ? Breast. ? Ovarian. ? Tubal. ? Peritoneal cancers.  Results of the assessment will determine the need for genetic counseling and BRCA1 and BRCA2 testing.  Cervical Cancer Your health care provider may recommend that you be screened regularly for cancer of the pelvic organs (ovaries, uterus, and vagina). This screening involves a pelvic examination, including checking for microscopic changes to the surface of your cervix (Pap test). You may be encouraged to have this screening done every 3 years, beginning at age 3.  For women ages 27-65, health care providers may recommend pelvic exams and Pap testing every 3 years, or they may recommend the Pap and pelvic exam, combined with testing for human papilloma virus (HPV), every 5 years. Some types of HPV increase your risk of cervical cancer. Testing for HPV may also be done on women of any age with unclear Pap test results.  Other health care providers may not recommend any screening for nonpregnant women who are considered low risk for pelvic cancer and who do not have symptoms. Ask your health care provider if a screening pelvic exam is right for you.  If you have had past treatment for cervical cancer or a condition that could lead to cancer, you need Pap tests and screening for cancer for at least 20 years after your treatment. If Pap tests have been discontinued, your risk factors (such as having a new sexual partner) need to be reassessed to determine if screening should resume. Some women have medical problems that increase the chance of getting cervical cancer. In these cases, your health care provider may recommend more frequent screening and Pap tests.  Colorectal Cancer  This type of cancer can be detected and often prevented.  Routine colorectal cancer screening usually begins at 60 years of age and continues through 60 years of age.  Your health care provider may recommend screening at an earlier age if you have risk factors  for colon cancer.  Your health care provider may also recommend using home test kits to check for hidden blood in the stool.  A small camera at the end of a tube can be used to examine your colon directly (sigmoidoscopy or colonoscopy). This is done to check for the earliest forms of colorectal cancer.  Routine screening usually begins at age 27.  Direct examination of the colon should be repeated every 5-10 years through 60 years of age. However, you may need to be screened more often if early forms of precancerous polyps or small growths are found.  Skin Cancer  Check your skin from head to toe regularly.  Tell your health care provider about any new moles or changes in moles, especially if there is a change in a mole's shape or color.  Also tell your health care provider if you have a mole that is larger than the size of a pencil eraser.  Always use sunscreen. Apply sunscreen liberally and repeatedly throughout the day.  Protect yourself by wearing long sleeves, pants, a wide-brimmed hat, and sunglasses whenever you are outside.  Heart disease, diabetes, and high blood pressure  High blood pressure causes heart disease and increases the risk of stroke. High blood pressure is more likely to develop in: ? People who have blood pressure in the high end of the normal range (130-139/85-89 mm Hg). ? People who are overweight or obese. ? People who are African American.  If you are 57-39 years of age, have your blood pressure checked every 3-5 years. If you are 19 years of age or older, have your blood pressure checked every year. You should have your blood pressure measured twice-once when you are at a hospital or clinic, and once when you are not at a hospital or clinic. Record the average of the two measurements. To check your blood pressure when you are not at a hospital or clinic, you can use: ? An automated blood pressure machine at a pharmacy. ? A home blood pressure monitor.  If  you are between 19 years and 38 years old, ask your health care provider if you should take aspirin to prevent strokes.  Have regular diabetes screenings. This involves taking a blood sample to check your fasting blood sugar level. ? If you are at a normal weight and have a low risk for diabetes, have this test once every three years after 60 years of age. ? If you are overweight and have a high risk for diabetes, consider being tested at a younger age or more often. Preventing infection Hepatitis B  If you have a higher risk for hepatitis B, you should be screened for this virus. You are considered at high risk for hepatitis B if: ? You were born in a country where hepatitis B is common. Ask your health care provider which countries are considered high risk. ? Your parents were born in a high-risk country, and you have not been immunized against hepatitis B (hepatitis B vaccine). ? You have HIV or AIDS. ? You use needles to inject street drugs. ? You live with someone who has hepatitis B. ? You have had sex with someone who has hepatitis B. ? You get hemodialysis treatment. ? You take certain medicines for conditions, including cancer, organ transplantation, and autoimmune conditions.  Hepatitis C  Blood testing is recommended for: ? Everyone born from [birth year] through 1964. ? Anyone with known risk factors for hepatitis C.  Sexually transmitted infections (STIs)  You should be screened for sexually transmitted infections (STIs) including gonorrhea and chlamydia if: ? You are sexually active and are younger than 60 years of age. ? You are older than 60 years of age and your health care provider tells you that you are at risk for this type of infection. ? Your sexual activity has changed since you were last screened and you are at an increased risk for chlamydia or gonorrhea. Ask your health care provider if you are at risk.  If you do not have HIV, but are at risk, it may be recommended  that you take a prescription medicine daily to prevent HIV infection. This is called pre-exposure prophylaxis (PrEP). You are considered at risk if: ? You are sexually active and do not regularly use condoms or know the HIV status of your partner(s). ? You take drugs by injection. ? You are sexually active with a partner who has HIV.  Talk with your health care provider about whether you are at high risk of  being infected with HIV. If you choose to begin PrEP, you should first be tested for HIV. You should then be tested every 3 months for as long as you are taking PrEP. Pregnancy  If you are premenopausal and you may become pregnant, ask your health care provider about preconception counseling.  If you may become pregnant, take 400 to 800 micrograms (mcg) of folic acid every day.  If you want to prevent pregnancy, talk to your health care provider about birth control (contraception). Osteoporosis and menopause  Osteoporosis is a disease in which the bones lose minerals and strength with aging. This can result in serious bone fractures. Your risk for osteoporosis can be identified using a bone density scan.  If you are 63 years of age or older, or if you are at risk for osteoporosis and fractures, ask your health care provider if you should be screened.  Ask your health care provider whether you should take a calcium or vitamin D supplement to lower your risk for osteoporosis.  Menopause may have certain physical symptoms and risks.  Hormone replacement therapy may reduce some of these symptoms and risks. Talk to your health care provider about whether hormone replacement therapy is right for you. Follow these instructions at home:  Schedule regular health, dental, and eye exams.  Stay current with your immunizations.  Do not use any tobacco products including cigarettes, chewing tobacco, or electronic cigarettes.  If you are pregnant, do not drink alcohol.  If you are  breastfeeding, limit how much and how often you drink alcohol.  Limit alcohol intake to no more than 1 drink per day for nonpregnant women. One drink equals 12 ounces of beer, 5 ounces of wine, or 1 ounces of hard liquor.  Do not use street drugs.  Do not share needles.  Ask your health care provider for help if you need support or information about quitting drugs.  Tell your health care provider if you often feel depressed.  Tell your health care provider if you have ever been abused or do not feel safe at home. This information is not intended to replace advice given to you by your health care provider. Make sure you discuss any questions you have with your health care provider. Document Released: 10/09/2010 Document Revised: 09/01/2015 Document Reviewed: 12/28/2014 Elsevier Interactive Patient Education  [employer].
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
## 2017-03-25 NOTE — Assessment & Plan Note (Signed)
Preventative protocols reviewed and updated unless pt declined. Discussed healthy diet and lifestyle.  

## 2017-05-17 DIAGNOSIS — J09X2 Influenza due to identified novel influenza A virus with other respiratory manifestations: Secondary | ICD-10-CM | POA: Diagnosis not present

## 2017-05-21 DIAGNOSIS — R05 Cough: Secondary | ICD-10-CM | POA: Diagnosis not present

## 2017-05-21 DIAGNOSIS — R509 Fever, unspecified: Secondary | ICD-10-CM | POA: Diagnosis not present

## 2017-05-21 DIAGNOSIS — J019 Acute sinusitis, unspecified: Secondary | ICD-10-CM | POA: Diagnosis not present

## 2017-05-29 ENCOUNTER — Ambulatory Visit: Payer: Managed Care, Other (non HMO) | Admitting: Family Medicine

## 2017-05-29 ENCOUNTER — Other Ambulatory Visit: Payer: Self-pay | Admitting: *Deleted

## 2017-05-29 ENCOUNTER — Other Ambulatory Visit: Payer: Self-pay

## 2017-05-29 ENCOUNTER — Encounter: Payer: Self-pay | Admitting: Family Medicine

## 2017-05-29 VITALS — BP 156/94 | HR 79 | Temp 98.4°F | Ht 64.5 in | Wt 162.0 lb

## 2017-05-29 DIAGNOSIS — J189 Pneumonia, unspecified organism: Secondary | ICD-10-CM

## 2017-05-29 MED ORDER — LEVOFLOXACIN 500 MG PO TABS: 500.0000 mg | ORAL_TABLET | Freq: Every day | ORAL | 0 refills | Status: DC
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
## 2017-05-30 ENCOUNTER — Encounter: Payer: Self-pay | Admitting: Family Medicine

## 2017-06-05 DIAGNOSIS — Z85820 Personal history of malignant melanoma of skin: Secondary | ICD-10-CM | POA: Diagnosis not present

## 2017-06-05 DIAGNOSIS — D2272 Melanocytic nevi of left lower limb, including hip: Secondary | ICD-10-CM | POA: Diagnosis not present

## 2017-06-05 DIAGNOSIS — L570 Actinic keratosis: Secondary | ICD-10-CM | POA: Diagnosis not present

## 2017-06-05 DIAGNOSIS — D692 Other nonthrombocytopenic purpura: Secondary | ICD-10-CM | POA: Diagnosis not present

## 2017-06-05 DIAGNOSIS — D225 Melanocytic nevi of trunk: Secondary | ICD-10-CM | POA: Diagnosis not present

## 2017-06-05 DIAGNOSIS — D485 Neoplasm of uncertain behavior of skin: Secondary | ICD-10-CM | POA: Diagnosis not present

## 2017-06-05 DIAGNOSIS — D2262 Melanocytic nevi of left upper limb, including shoulder: Secondary | ICD-10-CM | POA: Diagnosis not present

## 2017-06-05 DIAGNOSIS — L821 Other seborrheic keratosis: Secondary | ICD-10-CM | POA: Diagnosis not present

## 2017-06-07 DIAGNOSIS — Z1231 Encounter for screening mammogram for malignant neoplasm of breast: Secondary | ICD-10-CM | POA: Diagnosis not present

## 2017-06-20 DIAGNOSIS — H5210 Myopia, unspecified eye: Secondary | ICD-10-CM | POA: Diagnosis not present

## 2017-06-22 ENCOUNTER — Encounter: Payer: Self-pay | Admitting: Family Medicine

## 2017-06-26 MED ORDER — DIPHENHYDRAMINE HCL 25 MG PO TABS: 12.5000 mg | ORAL_TABLET | Freq: Every evening | ORAL | Status: AC | PRN

## 2017-07-01 DIAGNOSIS — H0102B Squamous blepharitis left eye, upper and lower eyelids: Secondary | ICD-10-CM | POA: Diagnosis not present

## 2017-08-08 DIAGNOSIS — H0102B Squamous blepharitis left eye, upper and lower eyelids: Secondary | ICD-10-CM | POA: Diagnosis not present

## 2017-08-08 DIAGNOSIS — H0102A Squamous blepharitis right eye, upper and lower eyelids: Secondary | ICD-10-CM | POA: Diagnosis not present

## 2017-08-21 ENCOUNTER — Other Ambulatory Visit: Payer: Self-pay | Admitting: Obstetrics and Gynecology

## 2017-08-21 ENCOUNTER — Ambulatory Visit: Payer: Managed Care, Other (non HMO) | Admitting: Obstetrics and Gynecology

## 2017-08-21 ENCOUNTER — Encounter: Payer: Self-pay | Admitting: Obstetrics and Gynecology

## 2017-08-21 ENCOUNTER — Other Ambulatory Visit: Payer: Self-pay

## 2017-08-21 VITALS — BP 142/80 | HR 60 | Resp 16 | Ht 65.0 in | Wt 158.2 lb

## 2017-08-21 DIAGNOSIS — Z01419 Encounter for gynecological examination (general) (routine) without abnormal findings: Secondary | ICD-10-CM

## 2017-08-21 NOTE — Patient Instructions (Signed)
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
## 2017-08-22 LAB — TSH: TSH: 1.88 mIU/L (ref 0.40–4.50)

## 2017-09-17 ENCOUNTER — Encounter: Payer: Self-pay | Admitting: Obstetrics and Gynecology

## 2017-10-23 DIAGNOSIS — H0102B Squamous blepharitis left eye, upper and lower eyelids: Secondary | ICD-10-CM | POA: Diagnosis not present

## 2017-10-23 DIAGNOSIS — H0102A Squamous blepharitis right eye, upper and lower eyelids: Secondary | ICD-10-CM | POA: Diagnosis not present

## 2017-11-05 ENCOUNTER — Other Ambulatory Visit: Payer: Self-pay | Admitting: Cardiology

## 2017-11-05 NOTE — Telephone Encounter (Signed)
Rx request sent to pharmacy.  

## 2017-12-07 ENCOUNTER — Other Ambulatory Visit: Payer: Self-pay | Admitting: Cardiology

## 2017-12-13 IMAGING — CR DG CHEST 2V
2 series · 2 of 2 positions shown · non-contrast
Comparison: 05/09/2005

CLINICAL DATA: Heart palpitations since yesterday, history
melanoma, coronary artery disease, former smoker, hyperlipidemia

EXAM:
CHEST  2 VIEW

[w chest pa]
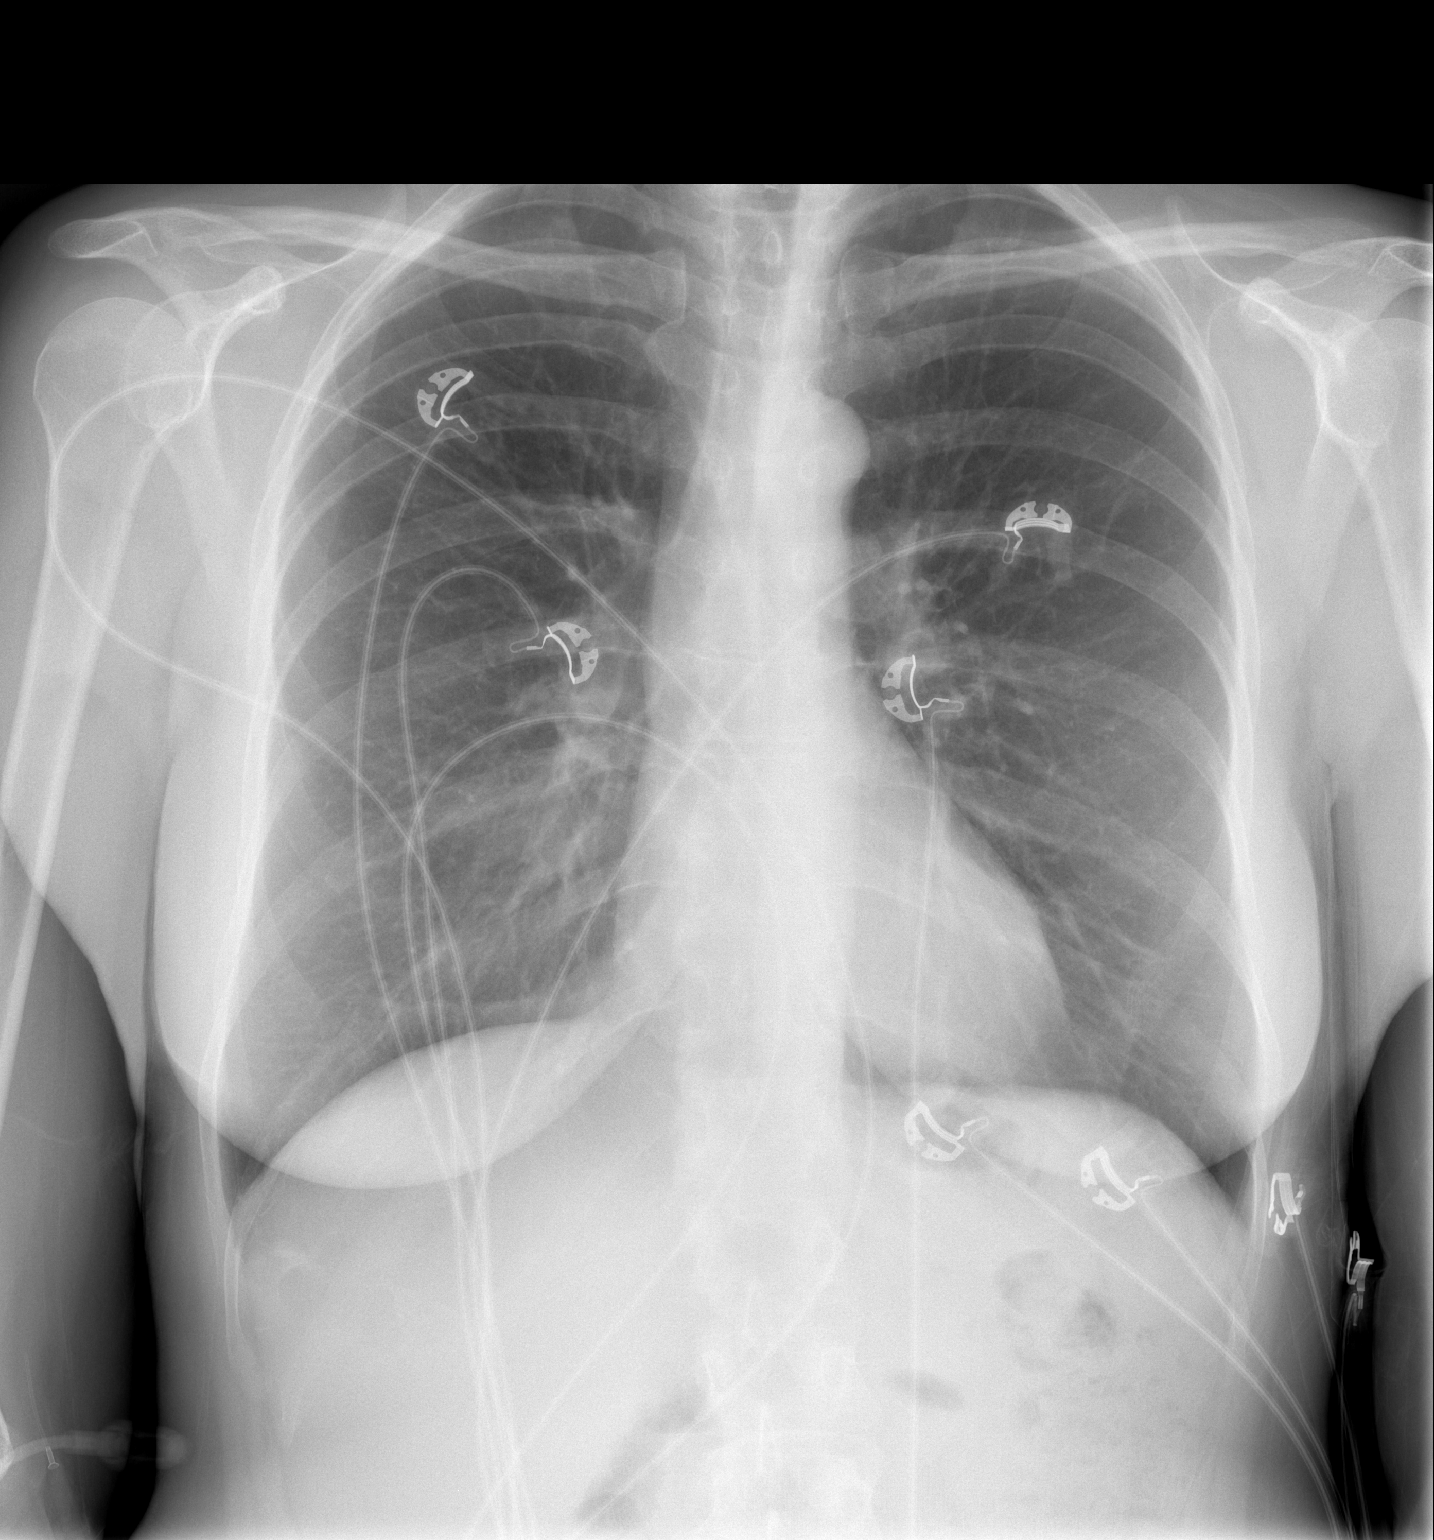

[w chest lat]
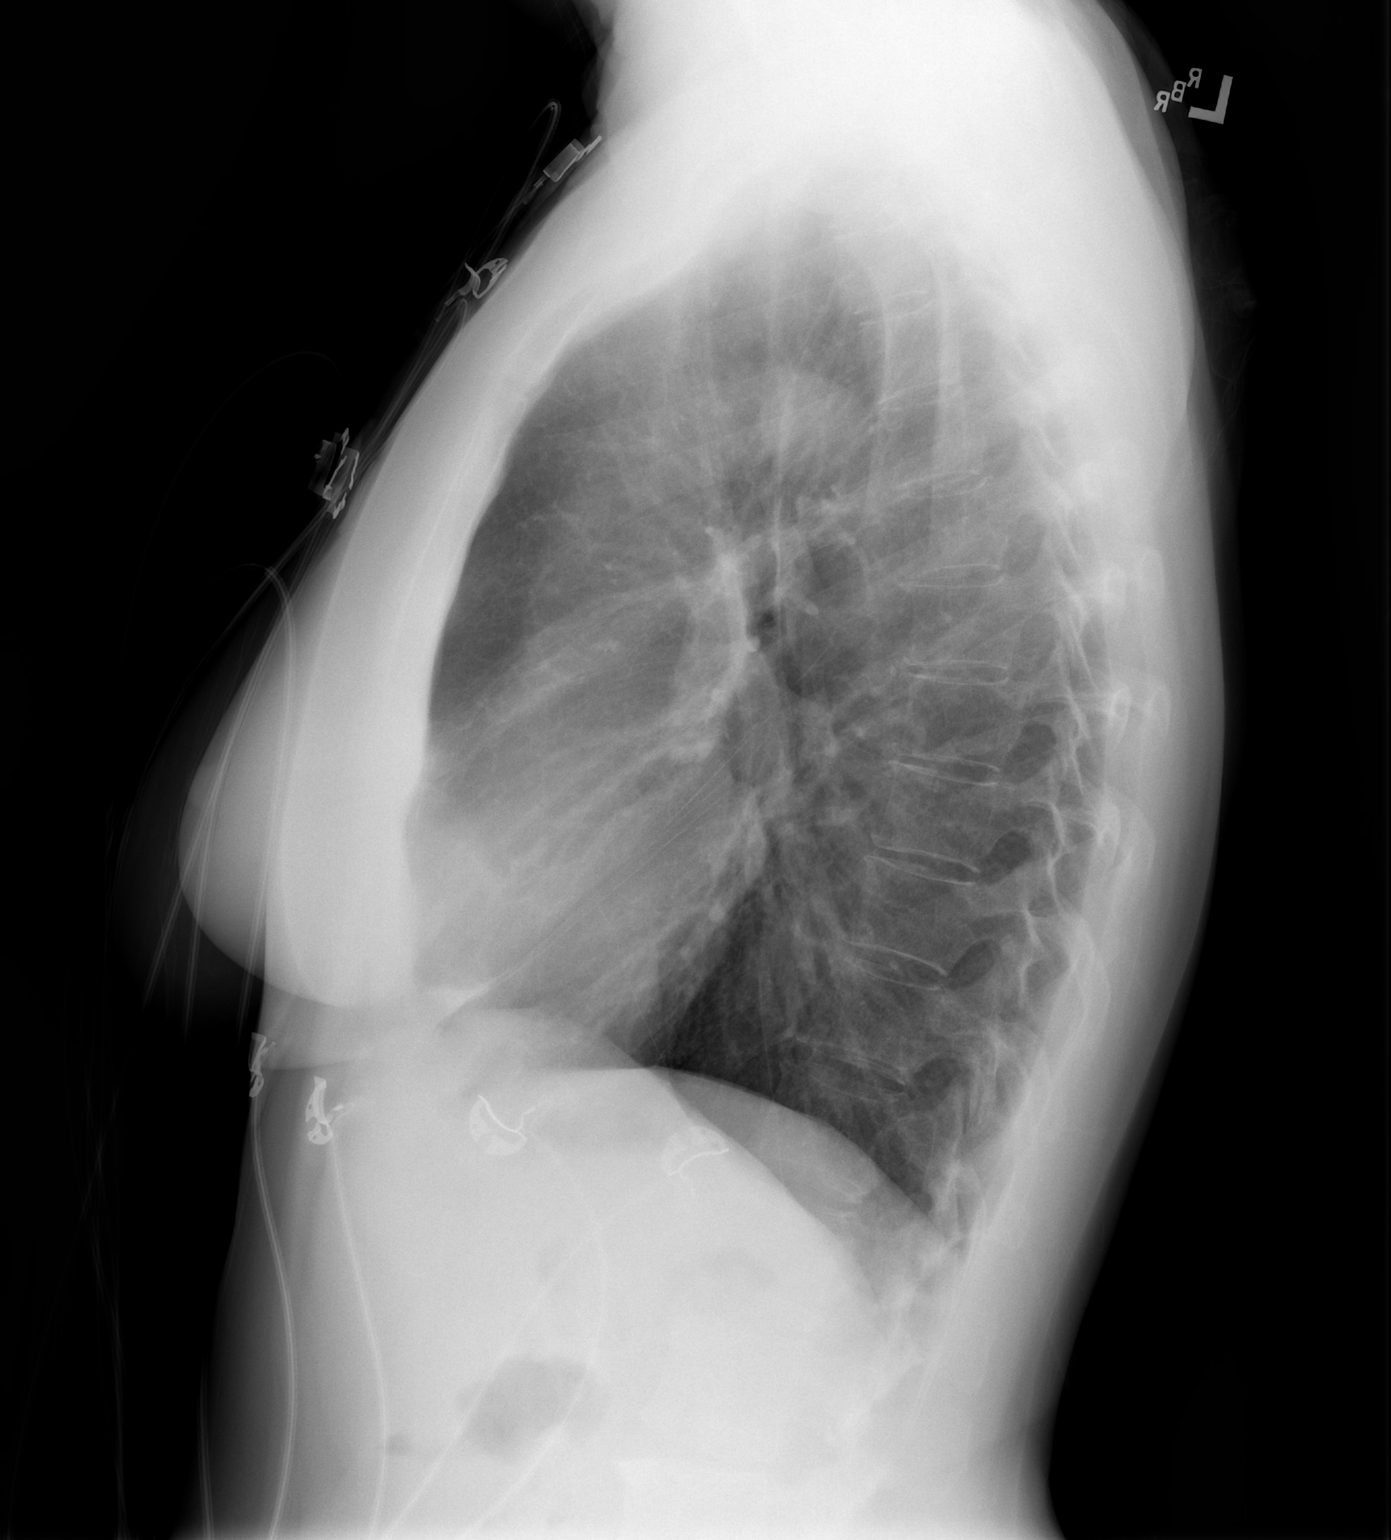

[2 of 2 positions shown; findings below may reference images not displayed]

FINDINGS: Normal heart size, mediastinal contours, and pulmonary vascularity.

Lungs clear.

No pneumothorax.

Bones unremarkable.
IMPRESSION: Normal exam.

## 2017-12-29 DIAGNOSIS — J069 Acute upper respiratory infection, unspecified: Secondary | ICD-10-CM | POA: Diagnosis not present

## 2018-01-24 LAB — HEPATIC FUNCTION PANEL
ALK PHOS: 78 (ref 25–125)
ALT: 12 (ref 7–35)
AST: 15 (ref 13–35)
Bilirubin, Total: 0.3

## 2018-01-24 LAB — VITAMIN D 25 HYDROXY (VIT D DEFICIENCY, FRACTURES): VIT D 25 HYDROXY: 22

## 2018-01-24 LAB — IRON,TIBC AND FERRITIN PANEL
Ferritin: 195
IRON: 73

## 2018-01-24 LAB — CBC AND DIFFERENTIAL
HEMOGLOBIN: 12.8 (ref 12.0–16.0)
Platelets: 292 (ref 150–399)
WBC: 4.2

## 2018-01-24 LAB — BASIC METABOLIC PANEL
CREATININE: 0.8 (ref 0.5–1.1)
GLUCOSE: 93

## 2018-01-24 LAB — HEMOGLOBIN A1C: Hemoglobin A1C: 5.2

## 2018-01-24 LAB — LIPID PANEL
Cholesterol: 193 (ref 0–200)
HDL: 93 — AB (ref 35–70)
LDL CALC: 81
TRIGLYCERIDES: 97 (ref 40–160)

## 2018-01-24 LAB — TSH: TSH: 1.27 (ref 0.41–5.90)

## 2018-01-31 ENCOUNTER — Encounter: Payer: Self-pay | Admitting: Family Medicine

## 2018-01-31 DIAGNOSIS — R7982 Elevated C-reactive protein (CRP): Secondary | ICD-10-CM

## 2018-01-31 NOTE — Telephone Encounter (Signed)
Printed labs.  Placed in Dr. Synthia Innocent box.

## 2018-02-03 ENCOUNTER — Other Ambulatory Visit (INDEPENDENT_AMBULATORY_CARE_PROVIDER_SITE_OTHER): Payer: Managed Care, Other (non HMO)

## 2018-02-03 ENCOUNTER — Encounter: Payer: Self-pay | Admitting: Family Medicine

## 2018-02-03 DIAGNOSIS — R7982 Elevated C-reactive protein (CRP): Secondary | ICD-10-CM

## 2018-02-03 NOTE — Addendum Note (Signed)
Addended by: Ellamae Sia on: 02/03/2018 12:26 PM   Modules accepted: Orders

## 2018-02-04 LAB — HIGH SENSITIVITY CRP: hs-CRP: 0.4 mg/L

## 2018-03-09 ENCOUNTER — Other Ambulatory Visit: Payer: Self-pay | Admitting: Cardiology

## 2018-03-28 ENCOUNTER — Other Ambulatory Visit: Payer: Self-pay

## 2018-04-17 DIAGNOSIS — H04123 Dry eye syndrome of bilateral lacrimal glands: Secondary | ICD-10-CM | POA: Diagnosis not present

## 2018-04-29 ENCOUNTER — Other Ambulatory Visit: Payer: Self-pay | Admitting: Family Medicine

## 2018-04-29 ENCOUNTER — Other Ambulatory Visit (INDEPENDENT_AMBULATORY_CARE_PROVIDER_SITE_OTHER): Payer: 59

## 2018-04-29 DIAGNOSIS — E7849 Other hyperlipidemia: Secondary | ICD-10-CM | POA: Diagnosis not present

## 2018-04-29 DIAGNOSIS — E559 Vitamin D deficiency, unspecified: Secondary | ICD-10-CM

## 2018-04-30 LAB — LIPID PANEL
Cholesterol: 255 mg/dL — ABNORMAL HIGH (ref <200)
HDL: 88 mg/dL (ref >50)
LDL Cholesterol (Calc): 131 mg/dL (calc) — ABNORMAL HIGH
Non-HDL Cholesterol (Calc): 167 mg/dL (calc) — ABNORMAL HIGH (ref <130)
Total CHOL/HDL Ratio: 2.9 (calc) (ref <5.0)
Triglycerides: 216 mg/dL — ABNORMAL HIGH (ref <150)

## 2018-04-30 LAB — COMPREHENSIVE METABOLIC PANEL
AG Ratio: 1.9 (calc) (ref 1.0–2.5)
ALKALINE PHOSPHATASE (APISO): 64 U/L (ref 33–130)
ALT: 13 U/L (ref 6–29)
AST: 15 U/L (ref 10–35)
Albumin: 4.4 g/dL (ref 3.6–5.1)
BUN: 16 mg/dL (ref 7–25)
CO2: 27 mmol/L (ref 20–32)
Calcium: 9.6 mg/dL (ref 8.6–10.4)
Chloride: 103 mmol/L (ref 98–110)
Creat: 0.82 mg/dL (ref 0.50–0.99)
Globulin: 2.3 g/dL (calc) (ref 1.9–3.7)
Glucose, Bld: 91 mg/dL (ref 65–99)
Potassium: 4.4 mmol/L (ref 3.5–5.3)
Sodium: 141 mmol/L (ref 135–146)
Total Bilirubin: 0.3 mg/dL (ref 0.2–1.2)
Total Protein: 6.7 g/dL (ref 6.1–8.1)

## 2018-04-30 LAB — VITAMIN D 25 HYDROXY (VIT D DEFICIENCY, FRACTURES): Vit D, 25-Hydroxy: 20 ng/mL — ABNORMAL LOW (ref 30–100)

## 2018-05-02 ENCOUNTER — Other Ambulatory Visit: Payer: Managed Care, Other (non HMO)

## 2018-05-06 ENCOUNTER — Encounter: Payer: Managed Care, Other (non HMO) | Admitting: Family Medicine

## 2018-05-12 ENCOUNTER — Other Ambulatory Visit: Payer: Managed Care, Other (non HMO)

## 2018-05-14 ENCOUNTER — Encounter: Payer: Managed Care, Other (non HMO) | Admitting: Family Medicine

## 2018-06-09 LAB — HM MAMMOGRAPHY

## 2018-06-10 ENCOUNTER — Encounter: Payer: Self-pay | Admitting: Family Medicine

## 2018-06-17 ENCOUNTER — Encounter: Payer: Self-pay | Admitting: Family Medicine

## 2018-06-18 NOTE — Telephone Encounter (Signed)
I see pt. Husband recently passed away suddenly (was also my patient).  She is asking if son can start seeing me as I see parents. Will check with Anda Kraft who I believe is son's PCP if ok with her fors witch and if so, Shirlean Mylar can you call son Vonna Kotyk to set up appt with me?

## 2018-06-19 NOTE — Telephone Encounter (Signed)
Yes, of course. This seems very appropriate!

## 2018-06-26 ENCOUNTER — Telehealth: Payer: Self-pay | Admitting: Family Medicine

## 2018-06-26 DIAGNOSIS — E7849 Other hyperlipidemia: Secondary | ICD-10-CM

## 2018-06-26 DIAGNOSIS — E559 Vitamin D deficiency, unspecified: Secondary | ICD-10-CM

## 2018-06-26 NOTE — Telephone Encounter (Signed)
Pt is scheduled for her physical on 5/5. She is requesting her lab orders to be put in for [facility] to be the resulting agency.

## 2018-06-28 NOTE — Telephone Encounter (Signed)
Labs ordered for [facility].

## 2018-06-28 NOTE — Addendum Note (Signed)
Addended by: Ria Bush on: 06/28/2018 12:13 PM   Modules accepted: Orders

## 2018-07-01 ENCOUNTER — Encounter: Payer: 59 | Admitting: Family Medicine

## 2018-07-16 ENCOUNTER — Telehealth: Payer: Self-pay

## 2018-07-16 NOTE — Telephone Encounter (Signed)
Virtual Visit Pre-Appointment Phone Call  Steps For Call:  1. Confirm consent - "In the setting of the current Covid19 crisis, you are scheduled for a (phone or video) visit with your provider on (date) at (time).  Just as we do with many in-office visits, in order for you to participate in this visit, we must obtain consent.  If you'd like, I can send this to your mychart (if signed up) or email for you to review.  Otherwise, I can obtain your verbal consent now.  All virtual visits are billed to your insurance company just like a normal visit would be.  By agreeing to a virtual visit, we'd like you to understand that the technology does not allow for your provider to perform an examination, and thus may limit your provider's ability to fully assess your condition.  Finally, though the technology is pretty good, we cannot assure that it will always work on either your or our end, and in the setting of a video visit, we may have to convert it to a phone-only visit.  In either situation, we cannot ensure that we have a secure connection.  Are you willing to proceed?"  2. Give patient instructions for WebEx download to smartphone as below if video visit  3. Advise patient to be prepared with any vital sign or heart rhythm information, their current medicines, and a piece of paper and pen handy for any instructions they may receive the day of their visit  4. Inform patient they will receive a phone call 15 minutes prior to their appointment time (may be from unknown caller ID) so they should be prepared to answer  5. Confirm that appointment type is correct in Epic appointment notes (video vs telephone)    TELEPHONE CALL NOTE  [name] has been deemed a candidate for a follow-up tele-health visit to limit community exposure during the Covid-19 pandemic. I spoke with the patient via phone to ensure availability of phone/video source, confirm preferred email & phone number, and discuss  instructions and expectations.  I reminded [name] to be prepared with any vital sign and/or heart rhythm information that could potentially be obtained via home monitoring, at the time of her visit. I reminded [name] to expect a phone call at the time of her visit if her visit.  Did the patient verbally acknowledge consent to treatment? YES  [name], [place] 07/16/2018 4:33 PM   DOWNLOADING THE [place], go to [employer] and type in WebEx in the search bar. [place] [employer], the blue/green circle. The app is free but as with any other app downloads, their phone may require them to verify saved payment information or Apple password. The patient does NOT have to create an account.  - If Android, ask patient to go to [employer] and type in WebEx in the search bar. [place] [employer], the blue/green circle. The app is free but as with any other app downloads, their phone may require them to verify saved payment information or Android password. The patient does NOT have to create an account.   CONSENT FOR TELE-HEALTH VISIT - PLEASE REVIEW  I hereby voluntarily request, consent and authorize [facility] and its employed or contracted physicians, physician assistants, nurse practitioners or other licensed health care professionals (the Practitioner), to provide me with telemedicine health care services (the "Services") as deemed necessary by the treating Practitioner. I  acknowledge and consent to receive the Services by the Practitioner via telemedicine. I understand that the telemedicine visit will involve communicating with the Practitioner through live audiovisual communication technology and the disclosure of certain medical information by electronic transmission. I acknowledge that I have been given the opportunity to request an in-person assessment or other available alternative prior to the telemedicine visit and am  voluntarily participating in the telemedicine visit.  I understand that I have the right to withhold or withdraw my consent to the use of telemedicine in the course of my care at any time, without affecting my right to future care or treatment, and that the Practitioner or I may terminate the telemedicine visit at any time. I understand that I have the right to inspect all information obtained and/or recorded in the course of the telemedicine visit and may receive copies of available information for a reasonable fee.  I understand that some of the potential risks of receiving the Services via telemedicine include:  Marland Kitchen Delay or interruption in medical evaluation due to technological equipment failure or disruption; . Information transmitted may not be sufficient (e.g. poor resolution of images) to allow for appropriate medical decision making by the Practitioner; and/or  . In rare instances, security protocols could fail, causing a breach of personal health information.  Furthermore, I acknowledge that it is my responsibility to provide information about my medical history, conditions and care that is complete and accurate to the best of my ability. I acknowledge that Practitioner's advice, recommendations, and/or decision may be based on factors not within their control, such as incomplete or inaccurate data provided by me or distortions of diagnostic images or specimens that may result from electronic transmissions. I understand that the practice of medicine is not an exact science and that Practitioner makes no warranties or guarantees regarding treatment outcomes. I acknowledge that I will receive a copy of this consent concurrently upon execution via email to the email address I last provided but may also request a printed copy by calling the office of [place].    I understand that my insurance will be billed for this visit.   I have read or had this consent read to me. . I understand the  contents of this consent, which adequately explains the benefits and risks of the Services being provided via telemedicine.  . I have been provided ample opportunity to ask questions regarding this consent and the Services and have had my questions answered to my satisfaction. . I give my informed consent for the services to be provided through the use of telemedicine in my medical care  By participating in this telemedicine visit I agree to the above.

## 2018-07-24 ENCOUNTER — Telehealth: Payer: Self-pay | Admitting: Cardiology

## 2018-07-24 NOTE — Telephone Encounter (Signed)
Smartphone/mychart/consent obtained/pre reg complete/dc

## 2018-07-25 ENCOUNTER — Encounter: Payer: Self-pay | Admitting: Cardiology

## 2018-07-25 ENCOUNTER — Telehealth (INDEPENDENT_AMBULATORY_CARE_PROVIDER_SITE_OTHER): Payer: 59 | Admitting: Cardiology

## 2018-07-25 VITALS — BP 139/89 | HR 62 | Ht 65.0 in | Wt 147.0 lb

## 2018-07-25 DIAGNOSIS — I2510 Atherosclerotic heart disease of native coronary artery without angina pectoris: Secondary | ICD-10-CM | POA: Diagnosis not present

## 2018-07-25 DIAGNOSIS — E7849 Other hyperlipidemia: Secondary | ICD-10-CM

## 2018-07-25 DIAGNOSIS — Z8249 Family history of ischemic heart disease and other diseases of the circulatory system: Secondary | ICD-10-CM

## 2018-07-25 MED ORDER — PRAVASTATIN SODIUM 40 MG PO TABS: 40.0000 mg | ORAL_TABLET | Freq: Every evening | ORAL | 3 refills | Status: DC

## 2018-07-25 NOTE — Progress Notes (Signed)
Virtual Visit via Telephone Note   This visit type was conducted due to national recommendations for restrictions regarding the COVID-19 Pandemic (e.g. social distancing) in an effort to limit this patient's exposure and mitigate transmission in our community.  Due to her co-morbid illnesses, this patient is at least at moderate risk for complications without adequate follow up.  This format is felt to be most appropriate for this patient at this time.  The patient did not have access to video technology/had technical difficulties with video requiring transitioning to audio format only (telephone).  All issues noted in this document were discussed and addressed.  No physical exam could be performed with this format.  Please refer to the patient's chart for her  consent to telehealth for [facility].   Patient has given verbal permission to conduct this visit via virtual appointment and to bill insurance 07/16/2018 4:34PM     Evaluation Performed:  Follow-up visit  Date:  07/25/2018   ID:  [name], DOB [birth year], MRN [id number]  Patient Location: Home Provider Location: Home  PCP:  Ria Bush, MD  Cardiologist:  Glenetta Hew, MD   Electrophysiologist:  None   Chief Complaint:  ~18 month f/u = palpitations  History of Present Illness:    [name] is a 63 y.o. female with PMH notable for history of palpitations and strong family history for premature cardiac disease who presents via audio/video conferencing for a telehealth visit today for what amounts to be an 67-month follow-up .  Sadly, I was informed that [name]'s husband [name]) Patrick Jupiter who had been a patient of mine for many years prior to changing over to the heart failure team passed away last month.  He had been dealing with inoperable ischemic cardiomyopathy with multivessel disease.  He apparently died in his sleep.  [name] was last seen in January 04, 2017 --> she was doing relatively well.  At  that time was on pravastatin 20 mg daily and we checked a lipid panel which looked pretty good.  The plan was to consider checking a coronary calcium score next follow-up to determine how aggressive we need to be with.  Interval History:  [name] is doing quite well from a physical standpoint - emotionally still a bit worn out.   Fluttering sensation ~3-4/week lasting 5-10 seconds (had previously been 4-5 x day) -- occasionally takes her breath.   Cardiovascular ROS: no chest pain or dyspnea on exertion positive for - irregular heartbeat, palpitations and as noted  negative for - edema, loss of consciousness, orthopnea, paroxysmal nocturnal dyspnea, rapid heart rate, shortness of breath or TIA/amaurosis fugax.   The patient does not have symptoms concerning for COVID-19 infection (fever, chills, cough, or new shortness of breath).  The patient is practicing social distancing.  ROS:  Please see the history of present illness.    ROS  Past Medical History:  Diagnosis Date  . Anxiety   . CAD (coronary artery disease)    CPET Test suggests mild potential ischemic findings - likely Microvascular; Normal LV function on Echo, Overall Good score of CPET  . Diverticulosis    by colonosocpy  . Heart murmur    PVCs thought from MVP -- reportedly had echocardiogram in 2005 at [facility]; follow-up Echo February 2015: Normal LV size and function. EF 60-65% with no RWMA, no aortic stenosis, trivial MR with no suggestion of prolapse,  . History of chicken pox   . History of  colon polyps   . HLD (hyperlipidemia)    mild  . Malignant melanoma ([place]) 1988   L thigh, stage 3, yearly skin checks with Dr .Martinique at [facility]  . Vitamin D deficiency    Past Surgical History:  Procedure Laterality Date  . CARDIOVASCULAR STRESS TEST  08/2014   ETT - low risk, good exercise tolerance [name])  . CARDIOVASCULAR STRESS TEST (CPET)  2015   Low Risk Study, but evidence of early Myocardial  Dysfunction  . [place]  . COLONOSCOPY  2008   small hyperplastic polyps, small int hem, early diverticula ([place]) rpt 10 yrs  . DILATATION & CURRETTAGE/HYSTEROSCOPY WITH RESECTOCOPE N/A 06/02/2013   Procedure: DILATATION & CURETTAGE/HYSTEROSCOPY WITH RESECTOCOPE;  Surgeon: Jamey Reas de Berton Lan, MD;  Location: [facility];  Service: Gynecology;  Laterality: N/A;  . DILATION AND CURETTAGE OF UTERUS  1999  . [place]  . SKIN SURGERY  1988   melanoma excision  . TUBAL LIGATION  1998  . US ECHOCARDIOGRAPHY  05/2013   WNL, likely microvascular disease     Current Meds  Medication Sig  . ALPRAZolam (XANAX) 0.5 MG tablet Take 0.5 tablets (0.25 mg total) by mouth at bedtime as needed for sleep.  . Cholecalciferol (VITAMIN D3 PO) Take by mouth daily.  . Coenzyme Q10 (CO Q10 PO) Take 1 capsule by mouth daily.  . diphenhydrAMINE (BENADRYL ALLERGY) 25 MG tablet Take 0.5 tablets (12.5 mg total) by mouth at bedtime as needed for sleep.  . Melatonin 3 MG TABS Take 1 tablet by mouth at bedtime. Spray formulation  . NON FORMULARY Take 1 capsule by mouth daily. Tumeric  . Probiotic Product (PROBIOTIC PO) Take by mouth daily.  Marland Kitchen XIIDRA 5 % SOLN Place 1 drop into both eyes daily.   --Prescription for pravastatin ran out in November.  Has been out of it since then.  Allergies:   Patient has no known allergies.   Social History   Tobacco Use  . Smoking status: Former Smoker    Last attempt to quit: 04/09/1992    Years since quitting: 26.3  . Smokeless tobacco: Never Used  . Tobacco comment: quit 20 years ago  Substance Use Topics  . Alcohol use: Yes    Alcohol/week: 6.0 standard drinks    Types: 6 Glasses of wine per week    Comment: 4 glasses of wine a week  . Drug use: No     Family Hx: The patient's family history includes Coronary artery disease (age of onset: 51) in her brother; Coronary artery disease (age of onset: 105) in her father; Heart  disease (age of onset: 96) in her sister; Hypertension in her sister; Other (age of onset: 75) in her mother; Stroke in her maternal grandmother. There is no history of Cancer or Diabetes.   Prior CV studies:   The following studies were reviewed today: . none:  Labs/Other Tests and Data Reviewed:    EKG:  No ECG reviewed.  Recent Labs: 01/24/2018: Hemoglobin 12.8; Platelets 292; TSH 1.27 04/29/2018: ALT 13; BUN 16; Creat 0.82; Potassium 4.4; Sodium 141   Recent Lipid Panel Lab Results  Component Value Date   CHOL 255 (H) 04/29/2018   HDL 88 04/29/2018   LDLCALC 131 (H) 04/29/2018   LDLDIRECT 124 08/09/2011   TRIG 216 (H) 04/29/2018   CHOLHDL 2.9 04/29/2018   -- was not fasting & has lost some weight since then. (in November -  work labs - TC was 193) & has been out of Pravastatin  --This is compared to August 2019 total cholesterol 193, LDL 81 and triglycerides 97 & HDL 93  Prior to that, October 2018 LDL was 118 with LDL particle #1160 and Apo-b 84.  Total cholesterol was 231.  Wt Readings from Last 3 Encounters:  07/25/18 147 lb (66.7 kg)  08/21/17 158 lb 3.2 oz (71.8 kg)  05/29/17 162 lb (73.5 kg)     Objective:    Vital Signs:  BP 139/89   Pulse 62   Ht 5\' 5"  (1.651 m)   Wt 147 lb (66.7 kg)   LMP 12/15/2011   BMI 24.46 kg/m  - no vitals provided GEN:  no acute distress RESPIRATORY:  normal respiratory effort, symmetric expansion NEURO:  alert and oriented x 3, no obvious focal deficit PSYCH:  Understandably somewhat depressed mood but with normal affect.   ASSESSMENT & PLAN:    Problem List Items Addressed This Visit    Family history of early CAD (Chronic)   HLD (hyperlipidemia) (Chronic)   Relevant Medications   pravastatin (PRAVACHOL) 40 MG tablet   Other Relevant Orders   Lipid panel   Microvascular coronary disease - Primary (Chronic)   Relevant Medications   pravastatin (PRAVACHOL) 40 MG tablet     [name] is doing very well from a cardiac  standpoint.  Not having any active cardiac symptoms.  Palpitations are pretty much nonexistent now. Her lipid panel from January is potentially inaccurate inasmuch as she had been out of pravastatin for at least a month at that time, and it was not a fasting study.  She would like to recheck.  We will recheck her lipids in roughly 2 weeks, and she will follow-up with her PCP.  I did refill her pravastatin at 40 mg daily which she will take for least 2 months to make up for the missed months.  Depending on what her lipids look like, we may continue with 40 mg or have her back down to 20 mg.  As for her family history and microvascular disease issues, she is not really noticing any exertional dyspnea.  In light of the COVID-19 restrictions, and the recent passing of her husband, I think it is a little premature to think about doing studies such as a coronary calcium score.  As such, I think we will wait until next year to talk about doing a coronary calcium score.  COVID-19 Education: The signs and symptoms of COVID-19 were discussed with the patient and how to seek care for testing (follow up with PCP or arrange E-visit).   The importance of social distancing was discussed today.  Time:   Today, I have spent 15 minutes with the patient with telehealth technology discussing the above problems.     Medication Adjustments/Labs and Tests Ordered: Current medicines are reviewed at length with the patient today.  Concerns regarding medicines are outlined above.  Medication Instructions:  We will refill the pravastatin prescription, but increase it to 40 mg daily. (Take full 40 mg daily for at least 2 months, pending results of lipids, may reduce back to 20 mg)   Tests Ordered: Orders Placed This Encounter  Procedures  . Lipid panel  Fasting Lipid panel - in 2 weeks  Medication Changes: Meds ordered this encounter  Medications  . pravastatin (PRAVACHOL) 40 MG tablet    Sig: Take 1 tablet (40  mg total) by mouth every evening.    Dispense:  90 tablet    Refill:  3  Increasing pravastatin to 40 mg. -See above  Disposition:  Follow up 1 year    Signed, Glenetta Hew, MD  07/25/2018 4:57 PM    [place]

## 2018-07-25 NOTE — Patient Instructions (Addendum)
Medication Instructions:  Increase: Pravastatin 40 mg daily   If you need a refill on your cardiac medications before your next appointment, please call your pharmacy.   Lab work: Your physician recommends that you return for lab work in 2 weeks (fasting lipid)  If you have labs (blood work) drawn today and your tests are completely normal, you will receive your results only by: Marland Kitchen MyChart Message (if you have MyChart) OR . A paper copy in the mail If you have any lab test that is abnormal or we need to change your treatment, we will call you to review the results.  Testing/Procedures: None  Follow-Up: At [facility], you and your health needs are our priority.  As part of our continuing mission to provide you with exceptional heart care, we have created designated Provider Care Teams.  These Care Teams include your primary Cardiologist (physician) and Advanced Practice Providers (APPs -  Physician Assistants and Nurse Practitioners) who all work together to provide you with the care you need, when you need it. You will need a follow up appointment in 1 years.  Please call our office 2 months in advance to schedule this appointment.  You may see Glenetta Hew, MD or one of the following Advanced Practice Providers on your designated Care Team:   Rosaria Ferries, PA-C . Jory Sims, DNP, ANP

## 2018-08-12 ENCOUNTER — Encounter: Payer: 59 | Admitting: Family Medicine

## 2018-08-15 LAB — LIPID PANEL
Cholesterol: 194 mg/dL (ref <200)
HDL: 104 mg/dL (ref >=50)
LDL Cholesterol (Calc): 77 mg/dL (calc)
Non-HDL Cholesterol (Calc): 90 mg/dL (calc) (ref <130)
Total CHOL/HDL Ratio: 1.9 (calc) (ref <5.0)
Triglycerides: 52 mg/dL (ref <150)

## 2018-09-24 ENCOUNTER — Ambulatory Visit: Payer: Managed Care, Other (non HMO) | Admitting: Obstetrics and Gynecology

## 2018-09-30 ENCOUNTER — Other Ambulatory Visit (INDEPENDENT_AMBULATORY_CARE_PROVIDER_SITE_OTHER): Payer: 59

## 2018-09-30 DIAGNOSIS — E559 Vitamin D deficiency, unspecified: Secondary | ICD-10-CM

## 2018-09-30 DIAGNOSIS — E7849 Other hyperlipidemia: Secondary | ICD-10-CM | POA: Diagnosis not present

## 2018-10-01 LAB — COMPREHENSIVE METABOLIC PANEL
AG Ratio: 2.5 (calc) (ref 1.0–2.5)
ALT: 13 U/L (ref 6–29)
AST: 16 U/L (ref 10–35)
Albumin: 4.2 g/dL (ref 3.6–5.1)
Alkaline phosphatase (APISO): 58 U/L (ref 37–153)
BUN: 19 mg/dL (ref 7–25)
CO2: 25 mmol/L (ref 20–32)
Calcium: 9.2 mg/dL (ref 8.6–10.4)
Chloride: 107 mmol/L (ref 98–110)
Creat: 0.68 mg/dL (ref 0.50–0.99)
Globulin: 1.7 g/dL (calc) — ABNORMAL LOW (ref 1.9–3.7)
Glucose, Bld: 111 mg/dL — ABNORMAL HIGH (ref 65–99)
Potassium: 3.7 mmol/L (ref 3.5–5.3)
Sodium: 141 mmol/L (ref 135–146)
Total Bilirubin: 0.3 mg/dL (ref 0.2–1.2)
Total Protein: 5.9 g/dL — ABNORMAL LOW (ref 6.1–8.1)

## 2018-10-01 LAB — TSH: TSH: 1.69 mIU/L (ref 0.40–4.50)

## 2018-10-01 LAB — VITAMIN D 25 HYDROXY (VIT D DEFICIENCY, FRACTURES): Vit D, 25-Hydroxy: 42 ng/mL (ref 30–100)

## 2018-10-09 ENCOUNTER — Ambulatory Visit (INDEPENDENT_AMBULATORY_CARE_PROVIDER_SITE_OTHER): Payer: 59 | Admitting: Family Medicine

## 2018-10-09 ENCOUNTER — Other Ambulatory Visit: Payer: Self-pay

## 2018-10-09 ENCOUNTER — Encounter: Payer: Self-pay | Admitting: Family Medicine

## 2018-10-09 VITALS — BP 154/82 | HR 68 | Temp 97.8°F | Ht 65.0 in | Wt 144.2 lb

## 2018-10-09 DIAGNOSIS — M25521 Pain in right elbow: Secondary | ICD-10-CM | POA: Diagnosis not present

## 2018-10-09 DIAGNOSIS — F5104 Psychophysiologic insomnia: Secondary | ICD-10-CM

## 2018-10-09 DIAGNOSIS — R030 Elevated blood-pressure reading, without diagnosis of hypertension: Secondary | ICD-10-CM

## 2018-10-09 DIAGNOSIS — Z0001 Encounter for general adult medical examination with abnormal findings: Secondary | ICD-10-CM

## 2018-10-09 DIAGNOSIS — E7849 Other hyperlipidemia: Secondary | ICD-10-CM | POA: Diagnosis not present

## 2018-10-09 DIAGNOSIS — Z1211 Encounter for screening for malignant neoplasm of colon: Secondary | ICD-10-CM | POA: Diagnosis not present

## 2018-10-09 DIAGNOSIS — I2510 Atherosclerotic heart disease of native coronary artery without angina pectoris: Secondary | ICD-10-CM

## 2018-10-09 DIAGNOSIS — E559 Vitamin D deficiency, unspecified: Secondary | ICD-10-CM

## 2018-10-09 DIAGNOSIS — I10 Essential (primary) hypertension: Secondary | ICD-10-CM | POA: Insufficient documentation

## 2018-10-09 MED ORDER — ALPRAZOLAM 0.5 MG PO TABS: 0.2500 mg | ORAL_TABLET | Freq: Every evening | ORAL | 1 refills | Status: DC | PRN

## 2018-10-09 MED ORDER — AMLODIPINE BESYLATE 5 MG PO TABS: 5.0000 mg | ORAL_TABLET | Freq: Every day | ORAL | 3 refills | Status: DC

## 2018-10-09 MED ORDER — ZINC 50 MG PO CAPS: 1.0000 | ORAL_CAPSULE | Freq: Every day | ORAL | 0 refills | Status: AC

## 2018-10-09 MED ORDER — COENZYME Q10 200 MG PO CAPS: 200.0000 mg | ORAL_CAPSULE | Freq: Every day | ORAL | Status: AC

## 2018-10-09 NOTE — Assessment & Plan Note (Signed)
Chronic, continue pravastatin and CoQ10.  The ASCVD Risk score Mikey Bussing DC Jr., et al., 2013) failed to calculate for the following reasons:   The valid HDL cholesterol range is 20 to 100 mg/dL

## 2018-10-09 NOTE — Assessment & Plan Note (Signed)
Xanax refilled. Sparing use. Melatonin has been effective.

## 2018-10-09 NOTE — Assessment & Plan Note (Signed)
Preventative protocols reviewed and updated unless pt declined. Discussed healthy diet and lifestyle.  

## 2018-10-09 NOTE — Assessment & Plan Note (Signed)
Markedly elevated readings noted today. Reviewed DASH diet, rec increased potassium rich foods, limit salt/sodium in diet, increase water. She will start monitoring BP more closely at home (reviewed optimal checking procedure), and WASP for amlodipine 5mg  printed for pt to start if consistently >140/90. Reviewed side effects to monitor on CCB.

## 2018-10-09 NOTE — Progress Notes (Signed)
This visit was conducted in person.  BP (!) 154/82 (BP Location: Right Arm, Patient Position: Sitting, Cuff Size: Normal)   Pulse 68   Temp 97.8 F (36.6 C) (Temporal)   Ht '5\' 5"'$  (1.651 m)   Wt 144 lb 3 oz (65.4 kg)   LMP 12/15/2011   SpO2 98%   BMI 23.99 kg/m   BP Readings from Last 3 Encounters:  10/09/18 (!) 154/82  07/25/18 139/89  08/21/17 (!) 142/80   170/90 on recheck  CC: CPE Subjective:    Patient ID: [name], female    DOB: 03-Nov-1955, 63 y.o.   MRN: [id number]  HPI: [name] is a 63 y.o. female presenting on 10/09/2018 for Chaves suddenly passed away this year Jun 17, 2018 due to cardiac arrest. I now see her son Merrily Pew. 115 lb weight loss in the past year. Using [employer] meal service.   Microvascular ischemia CAD followed by Dr [name]. Strong fmhx CAD. On pravastatin, recently increased to '40mg'$  daily - noticing some L shoulder, R wrist and R elbow discomfort since starting statin. Persistent posterior R elbow discomfort over the past month. Treating PRN with ibuprofen 400-'600mg'$  in the morning. No redness/warmth of areas.   Chronic sleep maintenance insomnia - silenor was not covered by insurance. Ambien was not helpful, felt fog entire next day. Tried and failed benadryl and melatonin. Xanax has helped the most, initially prescribed by prior PCP. More trouble with sleep maintenance insomnia. She started taking melatonin spray '3mg'$  daily which has been effective, with rare PRN xanax use.   Preventative: Well woman with OBGYN Dr. Josefa Half - 08/2017, due for f/u may need pap this year. ongoing hot flashes, off HRT. Natural remedies were not helpful. Did not try effexor.  COLONOSCOPY Date: 06/17/2006 small hyperplastic polyps, small int hem, early diverticula [employer]) rpt 10 yrs. Would like iFOB this year. Mammogram -06/17/2018 WNL, [facility].  Flu shot yearly Tdap - 06/17/10 Shingrix - 01/2018, 05/2018 Seat belt [place] discussed.  Sunscreen use discussed. H/o  melanoma, followed regularly by derm yearly(Dr Amy Martinique) Non smoker - husband smokes outside  Alcohol - wine or seltzer on weekends (2 a day) Dentist q6 mo  Eye exam yearly  Caffeine: 2 cups coffee/day  Widower Patrick Jupiter 2018/06/17) 2 dogs.  Grown children  Occupation: Museum/gallery exhibitions officer Activity: walks 3 mi 3x/wk (1 hour)  Diet: good water, daily fruits/vegetables, red meat 1x/wk, fish seldom     Relevant past medical, surgical, family and social history reviewed and updated as indicated. Interim medical history since our last visit reviewed. Allergies and medications reviewed and updated. Outpatient Medications Prior to Visit  Medication Sig Dispense Refill  . Cholecalciferol (VITAMIN D3 PO) Take 1 capsule by mouth daily. Takes 5,000 IU daily    . Cyanocobalamin (VITAMIN B-12) 5000 MCG SUBL Place 1 tablet under the tongue daily.    . diphenhydrAMINE (BENADRYL ALLERGY) 25 MG tablet Take 0.5 tablets (12.5 mg total) by mouth at bedtime as needed for sleep.    . Melatonin 3 MG TABS Take 1 tablet by mouth at bedtime. Spray formulation    . NON FORMULARY Take 1 capsule by mouth daily. Tumeric    . pravastatin (PRAVACHOL) 40 MG tablet Take 1 tablet (40 mg total) by mouth every evening. 90 tablet 3  . Probiotic Product (PROBIOTIC PO) Take by mouth daily.    . Turmeric (QC TUMERIC COMPLEX PO) Take 1,000 mg by mouth.    . vitamin C (  ASCORBIC ACID) 500 MG tablet Take 500 mg by mouth daily. Takes 2 tablets daily    . XIIDRA 5 % SOLN Place 1 drop into both eyes daily.  11  . ALPRAZolam (XANAX) 0.5 MG tablet Take 0.5 tablets (0.25 mg total) by mouth at bedtime as needed for sleep. 30 tablet 3  . Coenzyme Q10 (CO Q10 PO) Take 1 capsule by mouth daily.     No facility-administered medications prior to visit.      Per HPI unless specifically indicated in ROS section below Review of Systems  Constitutional: Negative for activity change, appetite change, chills, fatigue, fever and unexpected weight  change.  HENT: Negative for hearing loss.   Eyes: Negative for visual disturbance.  Respiratory: Negative for cough, chest tightness, shortness of breath and wheezing.   Cardiovascular: Negative for chest pain, palpitations and leg swelling.  Gastrointestinal: Negative for abdominal distention, abdominal pain, blood in stool, constipation, diarrhea, nausea and vomiting.  Genitourinary: Negative for difficulty urinating and hematuria.  Musculoskeletal: Negative for arthralgias, myalgias and neck pain.  Skin: Negative for rash.  Neurological: Negative for dizziness, seizures, syncope and headaches.  Hematological: Negative for adenopathy. Does not bruise/bleed easily.  Psychiatric/Behavioral: Negative for dysphoric mood. The patient is not nervous/anxious.    Objective:    BP (!) 154/82 (BP Location: Right Arm, Patient Position: Sitting, Cuff Size: Normal)   Pulse 68   Temp 97.8 F (36.6 C) (Temporal)   Ht '5\' 5"'$  (1.651 m)   Wt 144 lb 3 oz (65.4 kg)   LMP 12/15/2011   SpO2 98%   BMI 23.99 kg/m   Wt Readings from Last 3 Encounters:  10/09/18 144 lb 3 oz (65.4 kg)  07/25/18 147 lb (66.7 kg)  08/21/17 158 lb 3.2 oz (71.8 kg)    Physical Exam Vitals signs and nursing note reviewed.  Constitutional:      General: She is not in acute distress.    Appearance: Normal appearance. She is well-developed. She is not ill-appearing.  HENT:     Head: Normocephalic and atraumatic.     Right Ear: Hearing, tympanic membrane, ear canal and external ear normal.     Left Ear: Hearing, tympanic membrane, ear canal and external ear normal.     Nose: Nose normal.     Mouth/Throat:     Mouth: Mucous membranes are moist.     Pharynx: Uvula midline. No oropharyngeal exudate or posterior oropharyngeal erythema.  Eyes:     General: No scleral icterus.    Extraocular Movements: Extraocular movements intact.     Conjunctiva/sclera: Conjunctivae normal.     Pupils: Pupils are equal, round, and reactive  to light.  Neck:     Musculoskeletal: Normal range of motion and neck supple.     Vascular: No carotid bruit.  Cardiovascular:     Rate and Rhythm: Normal rate and regular rhythm.     Pulses: Normal pulses.          Radial pulses are 2+ on the right side and 2+ on the left side.     Heart sounds: Normal heart sounds. No murmur.  Pulmonary:     Effort: Pulmonary effort is normal. No respiratory distress.     Breath sounds: Normal breath sounds. No wheezing, rhonchi or rales.  Abdominal:     General: Bowel sounds are normal. There is no distension.     Palpations: Abdomen is soft. There is no mass.     Tenderness: There is no abdominal tenderness.  There is no guarding or rebound.  Musculoskeletal: Normal range of motion.     Right lower leg: No edema.     Left lower leg: No edema.     Comments:  L elbow WNL R elbow - FROM flexion, discomfort at full extension, no significant pain to palpation at posterior elbow or epicondyles, mild discomfort with resisted pronation > supination  Lymphadenopathy:     Cervical: No cervical adenopathy.  Skin:    General: Skin is warm and dry.     Findings: No rash.  Neurological:     General: No focal deficit present.     Mental Status: She is alert and oriented to person, place, and time.     Comments: CN grossly intact, station and gait intact  Psychiatric:        Mood and Affect: Mood normal.        Behavior: Behavior normal.        Thought Content: Thought content normal.        Judgment: Judgment normal.     Comments: Tearful with discussion of husband's passing       Results for orders placed or performed in visit on 09/30/18  TSH  Result Value Ref Range   TSH 1.69 0.40 - 4.50 mIU/L  Comprehensive metabolic panel  Result Value Ref Range   Glucose, Bld 111 (H) 65 - 99 mg/dL   BUN 19 7 - 25 mg/dL   Creat 0.68 0.50 - 0.99 mg/dL   BUN/Creatinine Ratio NOT APPLICABLE 6 - 22 (calc)   Sodium 141 135 - 146 mmol/L   Potassium 3.7 3.5 - 5.3  mmol/L   Chloride 107 98 - 110 mmol/L   CO2 25 20 - 32 mmol/L   Calcium 9.2 8.6 - 10.4 mg/dL   Total Protein 5.9 (L) 6.1 - 8.1 g/dL   Albumin 4.2 3.6 - 5.1 g/dL   Globulin 1.7 (L) 1.9 - 3.7 g/dL (calc)   AG Ratio 2.5 1.0 - 2.5 (calc)   Total Bilirubin 0.3 0.2 - 1.2 mg/dL   Alkaline phosphatase (APISO) 58 37 - 153 U/L   AST 16 10 - 35 U/L   ALT 13 6 - 29 U/L  VITAMIN D 25 Hydroxy (Vit-D Deficiency, Fractures)  Result Value Ref Range   Vit D, 25-Hydroxy 42 30 - 100 ng/mL   Lab Results  Component Value Date   CHOL 194 08/15/2018   HDL 104 08/15/2018   LDLCALC 77 08/15/2018   LDLDIRECT 124 08/09/2011   TRIG 52 08/15/2018   CHOLHDL 1.9 08/15/2018    Depression screen PHQ 2/9 10/09/2018 03/25/2017  Decreased Interest 0 0  Down, Depressed, Hopeless 0 1  PHQ - 2 Score 0 1    Assessment & Plan:   Problem List Items Addressed This Visit    Vitamin D deficiency    Levels repleted on replacement.       Right elbow pain    Unclear cause after exam. Possible lateral epicondylitis - will mail handout with exercises. Discussed NSAID and heating pad. Update if not improving with this.       Microvascular coronary disease (Chronic)   Relevant Medications   amLODipine (NORVASC) 5 MG tablet   HLD (hyperlipidemia) (Chronic)    Chronic, continue pravastatin and CoQ10.  The ASCVD Risk score Mikey Bussing DC Jr., et al., 2013) failed to calculate for the following reasons:   The valid HDL cholesterol range is 20 to 100 mg/dL       Relevant  Medications   amLODipine (NORVASC) 5 MG tablet   Encounter for health maintenance examination with abnormal findings    Preventative protocols reviewed and updated unless pt declined. Discussed healthy diet and lifestyle.       Elevated blood pressure reading in office without diagnosis of hypertension    Markedly elevated readings noted today. Reviewed DASH diet, rec increased potassium rich foods, limit salt/sodium in diet, increase water. She will start  monitoring BP more closely at home (reviewed optimal checking procedure), and WASP for amlodipine '5mg'$  printed for pt to start if consistently >140/90. Reviewed side effects to monitor on CCB.       Chronic insomnia    Xanax refilled. Sparing use. Melatonin has been effective.        Other Visit Diagnoses    Special screening for malignant neoplasms, colon    -  Primary       Meds ordered this encounter  Medications  . Coenzyme Q10 ([employer] COQ-10) 200 MG capsule    Sig: Take 1 capsule (200 mg total) by mouth daily.    Dispense:     . Zinc 50 MG CAPS    Sig: Take 1 capsule (50 mg total) by mouth daily.    Refill:  0  . amLODipine (NORVASC) 5 MG tablet    Sig: Take 1 tablet (5 mg total) by mouth daily.    Dispense:  30 tablet    Refill:  3  . ALPRAZolam (XANAX) 0.5 MG tablet    Sig: Take 0.5 tablets (0.25 mg total) by mouth at bedtime as needed for sleep.    Dispense:  30 tablet    Refill:  1   No orders of the defined types were placed in this encounter.  Patient instructions: Pass by lab for stool kit (quest). Ensure good source of protein with each meal as your protein levels were a bit low this year.  For right elbow - try stretching exercises provided today, heating pad to elbow. Let us know if not improving with this.  Blood pressure is staying too high - check at home more frequently, start amlodipine '5mg'$  daily if blood pressures at home consistently >140/90.  Return in 6 weeks for blood pressure f/u visit.   Follow up plan: Return in about 6 months (around 04/11/2019) for annual exam, prior fasting for blood work.  Ria Bush, MD

## 2018-10-09 NOTE — Assessment & Plan Note (Signed)
Unclear cause after exam. Possible lateral epicondylitis - will mail handout with exercises. Discussed NSAID and heating pad. Update if not improving with this.

## 2018-10-09 NOTE — Patient Instructions (Addendum)
Pass by lab for stool kit (quest). Ensure good source of protein with each meal as your protein levels were a bit low this year.  For right elbow - try stretching exercises provided today, heating pad to elbow. Let us know if not improving with this.  Blood pressure is staying too high - check at home more frequently, start amlodipine '5mg'$  daily if blood pressures at home consistently >140/90.  Return in 6 weeks for blood pressure f/u visit.   Health Maintenance, Female Adopting a healthy lifestyle and getting preventive care are important in promoting health and wellness. Ask your health care provider about:  The right schedule for you to have regular tests and exams.  Things you can do on your own to prevent diseases and keep yourself healthy. What should I know about diet, weight, and exercise? Eat a healthy diet   Eat a diet that includes plenty of vegetables, fruits, low-fat dairy products, and lean protein.  Do not eat a lot of foods that are high in solid fats, added sugars, or sodium. Maintain a healthy weight Body mass index (BMI) is used to identify weight problems. It estimates body fat based on height and weight. Your health care provider can help determine your BMI and help you achieve or maintain a healthy weight. Get regular exercise Get regular exercise. This is one of the most important things you can do for your health. Most adults should:  Exercise for at least 150 minutes each week. The exercise should increase your heart rate and make you sweat (moderate-intensity exercise).  Do strengthening exercises at least twice a week. This is in addition to the moderate-intensity exercise.  Spend less time sitting. Even light physical activity can be beneficial. Watch cholesterol and blood lipids Have your blood tested for lipids and cholesterol at 62 years of age, then have this test every 5 years. Have your cholesterol levels checked more often if:  Your lipid or  cholesterol levels are high.  You are older than 62 years of age.  You are at high risk for heart disease. What should I know about cancer screening? Depending on your health history and family history, you may need to have cancer screening at various ages. This may include screening for:  Breast cancer.  Cervical cancer.  Colorectal cancer.  Skin cancer.  Lung cancer. What should I know about heart disease, diabetes, and high blood pressure? Blood pressure and heart disease  High blood pressure causes heart disease and increases the risk of stroke. This is more likely to develop in people who have high blood pressure readings, are of African descent, or are overweight.  Have your blood pressure checked: ? Every 3-5 years if you are 60-21 years of age. ? Every year if you are 71 years old or older. Diabetes Have regular diabetes screenings. This checks your fasting blood sugar level. Have the screening done:  Once every three years after age 72 if you are at a normal weight and have a low risk for diabetes.  More often and at a younger age if you are overweight or have a high risk for diabetes. What should I know about preventing infection? Hepatitis B If you have a higher risk for hepatitis B, you should be screened for this virus. Talk with your health care provider to find out if you are at risk for hepatitis B infection. Hepatitis C Testing is recommended for:  Everyone born from [birth year] through 1964.  Anyone with known risk factors  for hepatitis C. Sexually transmitted infections (STIs)  Get screened for STIs, including gonorrhea and chlamydia, if: ? You are sexually active and are younger than 62 years of age. ? You are older than 62 years of age and your health care provider tells you that you are at risk for this type of infection. ? Your sexual activity has changed since you were last screened, and you are at increased risk for chlamydia or gonorrhea. Ask your  health care provider if you are at risk.  Ask your health care provider about whether you are at high risk for HIV. Your health care provider may recommend a prescription medicine to help prevent HIV infection. If you choose to take medicine to prevent HIV, you should first get tested for HIV. You should then be tested every 3 months for as long as you are taking the medicine. Pregnancy  If you are about to stop having your period (premenopausal) and you may become pregnant, seek counseling before you get pregnant.  Take 400 to 800 micrograms (mcg) of folic acid every day if you become pregnant.  Ask for birth control (contraception) if you want to prevent pregnancy. Osteoporosis and menopause Osteoporosis is a disease in which the bones lose minerals and strength with aging. This can result in bone fractures. If you are 66 years old or older, or if you are at risk for osteoporosis and fractures, ask your health care provider if you should:  Be screened for bone loss.  Take a calcium or vitamin D supplement to lower your risk of fractures.  Be given hormone replacement therapy (HRT) to treat symptoms of menopause. Follow these instructions at home: Lifestyle  Do not use any products that contain nicotine or tobacco, such as cigarettes, e-cigarettes, and chewing tobacco. If you need help quitting, ask your health care provider.  Do not use street drugs.  Do not share needles.  Ask your health care provider for help if you need support or information about quitting drugs. Alcohol use  Do not drink alcohol if: ? Your health care provider tells you not to drink. ? You are pregnant, may be pregnant, or are planning to become pregnant.  If you drink alcohol: ? Limit how much you use to 0-1 drink a day. ? Limit intake if you are breastfeeding.  Be aware of how much alcohol is in your drink. In the [place] one drink equals one 12 oz bottle of beer (355 mL), one 5 oz glass of wine (148  mL), or one 1 oz glass of hard liquor (44 mL). General instructions  Schedule regular health, dental, and eye exams.  Stay current with your vaccines.  Tell your health care provider if: ? You often feel depressed. ? You have ever been abused or do not feel safe at home. Summary  Adopting a healthy lifestyle and getting preventive care are important in promoting health and wellness.  Follow your health care provider's instructions about healthy diet, exercising, and getting tested or screened for diseases.  Follow your health care provider's instructions on monitoring your cholesterol and blood pressure. This information is not intended to replace advice given to you by your health care provider. Make sure you discuss any questions you have with your health care provider. Document Released: 10/09/2010 Document Revised: 03/19/2018 Document Reviewed: 03/19/2018 Elsevier Patient Education  2020 [employer].

## 2018-10-09 NOTE — Assessment & Plan Note (Signed)
Levels repleted on replacement.

## 2018-10-14 ENCOUNTER — Other Ambulatory Visit: Payer: Self-pay | Admitting: Family Medicine

## 2018-10-14 ENCOUNTER — Encounter: Payer: Self-pay | Admitting: Family Medicine

## 2018-10-16 ENCOUNTER — Encounter: Payer: Self-pay | Admitting: Family Medicine

## 2018-10-16 LAB — FECAL GLOBIN BY IMMUNOCHEMISTRY
FECAL GLOBIN RESULT:: NOT DETECTED
MICRO NUMBER:: 645964
SPECIMEN QUALITY:: ADEQUATE

## 2018-10-16 LAB — FECAL OCCULT BLOOD, GUAIAC: Fecal Occult Blood: NEGATIVE

## 2018-10-23 ENCOUNTER — Other Ambulatory Visit: Payer: Self-pay

## 2018-10-23 MED ORDER — PRAVASTATIN SODIUM 20 MG PO TABS: 20.0000 mg | ORAL_TABLET | Freq: Every evening | ORAL | 3 refills | Status: DC

## 2018-11-27 ENCOUNTER — Other Ambulatory Visit: Payer: Self-pay

## 2018-11-27 ENCOUNTER — Ambulatory Visit (INDEPENDENT_AMBULATORY_CARE_PROVIDER_SITE_OTHER)
Admission: RE | Admit: 2018-11-27 | Discharge: 2018-11-27 | Disposition: A | Discharge status: Home / Self Care | Payer: Managed Care, Other (non HMO) | Source: Ambulatory Visit | Attending: Family Medicine | Admitting: Family Medicine

## 2018-11-27 ENCOUNTER — Ambulatory Visit: Payer: Managed Care, Other (non HMO) | Admitting: Family Medicine

## 2018-11-27 ENCOUNTER — Encounter: Payer: Self-pay | Admitting: Family Medicine

## 2018-11-27 VITALS — BP 144/88 | HR 67 | Temp 98.2°F | Ht 65.0 in | Wt 144.1 lb

## 2018-11-27 DIAGNOSIS — L247 Irritant contact dermatitis due to plants, except food: Secondary | ICD-10-CM | POA: Diagnosis not present

## 2018-11-27 DIAGNOSIS — M25521 Pain in right elbow: Secondary | ICD-10-CM | POA: Diagnosis not present

## 2018-11-27 DIAGNOSIS — L259 Unspecified contact dermatitis, unspecified cause: Secondary | ICD-10-CM | POA: Insufficient documentation

## 2018-11-27 DIAGNOSIS — I10 Essential (primary) hypertension: Secondary | ICD-10-CM | POA: Diagnosis not present

## 2018-11-27 DIAGNOSIS — Z23 Encounter for immunization: Secondary | ICD-10-CM

## 2018-11-27 MED ORDER — DICLOFENAC SODIUM 1 % TD GEL: 2.0000 g | Freq: Three times a day (TID) | TRANSDERMAL | 1 refills | Status: DC

## 2018-11-27 MED ORDER — TRIAMCINOLONE ACETONIDE 0.1 % EX CREA: 1.0000 "application " | TOPICAL_CREAM | Freq: Two times a day (BID) | CUTANEOUS | 0 refills | Status: DC

## 2018-11-27 NOTE — Patient Instructions (Signed)
Steroid cream to skin rash on left forearm Voltaren anti inflammatory cream for R elbow pain.  Blood pressure remaining elevated - stop ibuprofen as this may contribute.  Return in 1 month for follow up blood pressure, let me know sooner if BP staying consistently >140/90.  Elbow xray today. If no improvement with voltaren gel, schedule appointment with Dr Lorelei Pont for evaluation.

## 2018-11-27 NOTE — Assessment & Plan Note (Signed)
Persistently elevated readings in setting of recent increased ibuprofen use. Advised stop PO NSAID (see below). Home readings actually well controlled. Home cuff seems to be accurate. Will continue amlodipine 5mg  for now, reassess at 1 mo f/u visit. Pt agrees with plan. Aware of diet changes to keep good control.

## 2018-11-27 NOTE — Assessment & Plan Note (Signed)
TCI cream sent to pharmacy.

## 2018-11-27 NOTE — Assessment & Plan Note (Signed)
Ongoing pain she has been treating with ibuprofen 600mg  BID - likely contributing to elevated BP readings recently. Will change to topical voltaren for elbow. Not consistent with ulnar neuropathy. Check films today to eval for arthritis. If ongoing, rec f/u with Dr Lorelei Pont sports medicine.

## 2018-11-27 NOTE — Progress Notes (Signed)
This visit was conducted in person.  BP (!) 144/88 (BP Location: Right Arm, Patient Position: Sitting, Cuff Size: Normal)   Pulse 67   Temp 98.2 F (36.8 C) (Temporal)   Ht 5\' 5"  (1.651 m)   Wt 144 lb 2 oz (65.4 kg)   LMP 12/15/2011   SpO2 98%   BMI 23.98 kg/m   160/88 on repeat  CC: 6 wk f/u visit  Subjective:    Patient ID: [name], female    DOB: [birth year], 63 y.o.   MRN: [id number]  HPI: [name] is a 63 y.o. female presenting on 11/27/2018 for Elevated BP (Here for 6 wk f/u.  Pt brought in home BP monitor to compare.  BP read 163/91 right arm and 150/89 in left arm. )   HTN - Compliant with current antihypertensive regimen of amlodipine 5mg  daily. Does check blood pressures at home and brings log - actually better than in office reading today 117-142/75-93. No low blood pressure readings or symptoms of dizziness/syncope. Denies HA, vision changes, CP/tightness, SOB, leg swelling.  Ongoing lateral R arm pain at elbow despite trying tennis elbow exercises. ongoing since early June. Started while cutting branches. Describes stabbing pain. No numbness/tingling of elbow. More noticeable at night, more noticeable when fully flexing/extending elbow. Treating with ibuprofen 600mg  BID. She has decreased pravastatin from 40-->20mg , then stopped for 1 wk - without benefit.   Itchy rash to L forearm after she got into something while working in her hard.      Relevant past medical, surgical, family and social history reviewed and updated as indicated. Interim medical history since our last visit reviewed. Allergies and medications reviewed and updated. Outpatient Medications Prior to Visit  Medication Sig Dispense Refill  . ALPRAZolam (XANAX) 0.5 MG tablet Take 0.5 tablets (0.25 mg total) by mouth at bedtime as needed for sleep. 30 tablet 1  . amLODipine (NORVASC) 5 MG tablet Take 1 tablet (5 mg total) by mouth daily. 30 tablet 3  . Cholecalciferol (VITAMIN D3 PO) Take 1  capsule by mouth daily. Takes 5,000 IU daily    . Coenzyme Q10 ([employer] COQ-10) 200 MG capsule Take 1 capsule (200 mg total) by mouth daily.    . Cyanocobalamin (VITAMIN B-12) 5000 MCG SUBL Place 1 tablet under the tongue daily.    . diphenhydrAMINE (BENADRYL ALLERGY) 25 MG tablet Take 0.5 tablets (12.5 mg total) by mouth at bedtime as needed for sleep.    . Melatonin 3 MG TABS Take 1 tablet by mouth at bedtime. Spray formulation    . NON FORMULARY Take 1 capsule by mouth daily. Tumeric    . pravastatin (PRAVACHOL) 20 MG tablet Take 1 tablet (20 mg total) by mouth every evening. 90 tablet 3  . Probiotic Product (PROBIOTIC PO) Take by mouth daily.    . Turmeric (QC TUMERIC COMPLEX PO) Take 1,000 mg by mouth.    . vitamin C (ASCORBIC ACID) 500 MG tablet Take 500 mg by mouth daily. Takes 2 tablets daily    . XIIDRA 5 % SOLN Place 1 drop into both eyes daily.  11  . Zinc 50 MG CAPS Take 1 capsule (50 mg total) by mouth daily.  0   No facility-administered medications prior to visit.      Per HPI unless specifically indicated in ROS section below Review of Systems Objective:    BP (!) 144/88 (BP Location: Right Arm, Patient Position: Sitting, Cuff Size: Normal)   Pulse 67  Temp 98.2 F (36.8 C) (Temporal)   Ht 5\' 5"  (1.651 m)   Wt 144 lb 2 oz (65.4 kg)   LMP 12/15/2011   SpO2 98%   BMI 23.98 kg/m   Wt Readings from Last 3 Encounters:  11/27/18 144 lb 2 oz (65.4 kg)  10/09/18 144 lb 3 oz (65.4 kg)  07/25/18 147 lb (66.7 kg)    Physical Exam Vitals signs and nursing note reviewed.  Constitutional:      Appearance: Normal appearance. She is not ill-appearing.  HENT:     Mouth/Throat:     Mouth: Mucous membranes are moist.     Pharynx: No posterior oropharyngeal erythema.  Cardiovascular:     Rate and Rhythm: Normal rate and regular rhythm.     Pulses: Normal pulses.     Heart sounds: Normal heart sounds. No murmur.  Pulmonary:     Effort: Pulmonary effort is normal. No  respiratory distress.     Breath sounds: Normal breath sounds. No wheezing, rhonchi or rales.  Musculoskeletal: Normal range of motion.        General: No swelling or tenderness.     Comments: R elbow: no pain at [facility], epicondyles, or antecubital fossa. No pain or subluxation with palpation of ulnar nerve. FROM flexion/extension with discomfort.  L elbow: WNL  Skin:    General: Skin is warm and dry.     Findings: Erythema and rash (itchy papular rash to L forearm) present.  Neurological:     Mental Status: She is alert.  Psychiatric:        Mood and Affect: Mood normal.        Behavior: Behavior normal.       Lab Results  Component Value Date   CREATININE 0.68 09/30/2018   BUN 19 09/30/2018   NA 141 09/30/2018   K 3.7 09/30/2018   CL 107 09/30/2018   CO2 25 09/30/2018    Assessment & Plan:   Problem List Items Addressed This Visit    Right elbow pain    Ongoing pain she has been treating with ibuprofen 600mg  BID - likely contributing to elevated BP readings recently. Will change to topical voltaren for elbow. Not consistent with ulnar neuropathy. Check films today to eval for arthritis. If ongoing, rec f/u with Dr Lorelei Pont sports medicine.       Relevant Orders   DG Elbow Complete Right   Essential hypertension - Primary    Persistently elevated readings in setting of recent increased ibuprofen use. Advised stop PO NSAID (see below). Home readings actually well controlled. Home cuff seems to be accurate. Will continue amlodipine 5mg  for now, reassess at 1 mo f/u visit. Pt agrees with plan. Aware of diet changes to keep good control.       Contact dermatitis    TCI cream sent to pharmacy.        Other Visit Diagnoses    Need for influenza vaccination       Relevant Orders   Flu Vaccine QUAD 36+ mos IM (Completed)       Meds ordered this encounter  Medications  . diclofenac sodium (VOLTAREN) 1 % GEL    Sig: Apply 2 g topically 3 (three) times daily.     Dispense:  100 g    Refill:  1  . triamcinolone cream (KENALOG) 0.1 %    Sig: Apply 1 application topically 2 (two) times daily. Apply to AA.    Dispense:  45 g  Refill:  0   Orders Placed This Encounter  Procedures  . DG Elbow Complete Right    Standing Status:   Future    Number of Occurrences:   1    Standing Expiration Date:   01/27/2020    Order Specific Question:   Reason for Exam (SYMPTOM  OR DIAGNOSIS REQUIRED)    Answer:   R elbow pain    Order Specific Question:   Preferred imaging location?    Answer:   [facility]    Order Specific Question:   Radiology Contrast Protocol - do NOT remove file path    Answer:   [email or web address]  . Flu Vaccine QUAD 36+ mos IM    Follow up plan: Return in about 4 weeks (around 12/25/2018), or if symptoms worsen or fail to improve, for follow up visit.  Ria Bush, MD

## 2019-01-02 ENCOUNTER — Ambulatory Visit: Payer: Managed Care, Other (non HMO) | Admitting: Family Medicine

## 2019-01-04 ENCOUNTER — Other Ambulatory Visit: Payer: Self-pay | Admitting: Family Medicine

## 2019-01-05 NOTE — Telephone Encounter (Signed)
Patient cancelled her 1 month f/u on 01/02/2019-notes say patient is feeling better. Does patient need to still come in to follow up?

## 2019-07-01 ENCOUNTER — Other Ambulatory Visit: Payer: Self-pay | Admitting: Family Medicine

## 2019-07-01 NOTE — Telephone Encounter (Signed)
Last OV 11/27/18 Last fill 01/07/19  #90/1

## 2019-07-13 ENCOUNTER — Other Ambulatory Visit: Payer: Self-pay | Admitting: Cardiology

## 2019-07-14 NOTE — Telephone Encounter (Signed)
Rx request sent to pharmacy.  

## 2019-10-08 ENCOUNTER — Encounter: Payer: Self-pay | Admitting: Cardiology

## 2019-10-08 ENCOUNTER — Other Ambulatory Visit: Payer: Self-pay

## 2019-10-08 ENCOUNTER — Ambulatory Visit: Payer: 59 | Admitting: Cardiology

## 2019-10-08 VITALS — BP 130/78 | HR 61 | Temp 97.0°F | Ht 65.0 in | Wt 153.0 lb

## 2019-10-08 DIAGNOSIS — E7849 Other hyperlipidemia: Secondary | ICD-10-CM | POA: Diagnosis not present

## 2019-10-08 DIAGNOSIS — Z8249 Family history of ischemic heart disease and other diseases of the circulatory system: Secondary | ICD-10-CM | POA: Diagnosis not present

## 2019-10-08 DIAGNOSIS — I2510 Atherosclerotic heart disease of native coronary artery without angina pectoris: Secondary | ICD-10-CM

## 2019-10-08 DIAGNOSIS — I10 Essential (primary) hypertension: Secondary | ICD-10-CM

## 2019-10-08 NOTE — Patient Instructions (Signed)
Medication Instructions:  No changes  *If you need a refill on your cardiac medications before your next appointment, please call your pharmacy*   Lab Work: Not needed   Testing/Procedures:  CT coronary calcium score. This test is done at [address]. This is $150 out of pocket.   Coronary CalciumScan A coronary calcium scan is an imaging test used to look for deposits of calcium and other fatty materials (plaques) in the inner lining of the blood vessels of the heart (coronary arteries). These deposits of calcium and plaques can partly clog and narrow the coronary arteries without producing any symptoms or warning signs. This puts a person at risk for a heart attack. This test can detect these deposits before symptoms develop. Tell a health care provider about:  Any allergies you have.  All medicines you are taking, including vitamins, herbs, eye drops, creams, and over-the-counter medicines.  Any problems you or family members have had with anesthetic medicines.  Any blood disorders you have.  Any surgeries you have had.  Any medical conditions you have.  Whether you are pregnant or may be pregnant. What are the risks? Generally, this is a safe procedure. However, problems may occur, including:  Harm to a pregnant woman and her unborn baby. This test involves the use of radiation. Radiation exposure can be dangerous to a pregnant woman and her unborn baby. If you are pregnant, you generally should not have this procedure done.  Slight increase in the risk of cancer. This is because of the radiation involved in the test. What happens before the procedure? No preparation is needed for this procedure. What happens during the procedure?  You will undress and remove any jewelry around your neck or chest.  You will put on a hospital gown.  Sticky electrodes will be placed on your chest. The electrodes will be connected to an electrocardiogram (ECG) machine  to record a tracing of the electrical activity of your heart.  A CT scanner will take pictures of your heart. During this time, you will be asked to lie still and hold your breath for 2-3 seconds while a picture of your heart is being taken. The procedure may vary among health care providers and hospitals. What happens after the procedure?  You can get dressed.  You can return to your normal activities.  It is up to you to get the results of your test. Ask your health care provider, or the department that is doing the test, when your results will be ready. Summary  A coronary calcium scan is an imaging test used to look for deposits of calcium and other fatty materials (plaques) in the inner lining of the blood vessels of the heart (coronary arteries).  Generally, this is a safe procedure. Tell your health care provider if you are pregnant or may be pregnant.  No preparation is needed for this procedure.  A CT scanner will take pictures of your heart.  You can return to your normal activities after the scan is done. This information is not intended to replace advice given to you by your health care provider. Make sure you discuss any questions you have with your health care provider. Document Released: 09/22/2007 Document Revised: 02/13/2016 Document Reviewed: 02/13/2016 Elsevier Interactive Patient Education  2017 [place]: At [facility], you and your health needs are our priority.  As part of our continuing mission to provide you with exceptional heart care, we have created  designated Provider Care Teams.  These Care Teams include your primary Cardiologist (physician) and Advanced Practice Providers (APPs -  Physician Assistants and Nurse Practitioners) who all work together to provide you with the care you need, when you need it.  We recommend signing up for the patient portal called "MyChart".  Sign up information is provided on this After Visit Summary.   MyChart is used to connect with patients for Virtual Visits (Telemedicine).  Patients are able to view lab/test results, encounter notes, upcoming appointments, etc.  Non-urgent messages can be sent to your provider as well.   To learn more about what you can do with MyChart, go to [email or web address].    Your next appointment:   12 month(s)  The format for your next appointment:   In Person  Provider:   Glenetta Hew, MD

## 2019-10-08 NOTE — Progress Notes (Signed)
Primary Care Provider: Ria Bush, MD Cardiologist: Glenetta Hew, MD Electrophysiologist: None  Clinic Note: Chief Complaint  Patient presents with  . Follow-up    Doing well.    HPI:    [name] is a 64 y.o. female with a history notable for intermittent palpitations and family history for premature CAD who presents today for delayed annual follow-up  [name] was last seen in April 2020 via telemedicine roughly 1 month after her husband [name]) [name] had passed away from complications of inoperable ischemic cardiomyopathy--died in his sleep..  She still had intermittent episodes of 5 to 10 seconds of fluttering sensations in her chest that may take her breath away.  May be 3 or 4-week.  Otherwise no symptoms.  Recent Hospitalizations: n/a  Reviewed  CV studies:    The following studies were reviewed today: (if available, images/films reviewed: From Epic Chart or Care Everywhere) . None:   Interval History:   [name] returns here today overall doing well.  She acknowledges that she is probably not as in good shape as she should be.  She does not exercise much as she should, but thankfully has not had any recent illnesses.  She has gained about 10 pounds over the COVID-19 lockdown timeframe mostly because of inactivity and comfort eating.  She is now about a year out from the death of her husband and seems to be moving on relatively well.  She still has periods where she is sad, but is happy that he is no longer suffering.  From a cardiac standpoint, she is really not having any symptoms of chest pain or pressure.  She does have occasional palpitations that happen very infrequently and last may be 5 to 10 seconds.  Nothing that is overly worrisome to her. She says her blood pressures have been pretty well controlled at home and she is not really having any edema.  We had increased her statin up to 40mg  along with co-Q10 and should not have any  myalgias.  She is due for labs that are usually checked by her PCP.  CV Review of Symptoms (Summary) Cardiovascular ROS: no chest pain or dyspnea on exertion positive for - palpitations, rapid heart rate and No prolonged episodes negative for - chest pain, edema, orthopnea, paroxysmal nocturnal dyspnea, shortness of breath or Lightheadedness, dizziness, wooziness, syncope/near syncope or TIA/amaurosis fugax; claudication  The patient does not have symptoms concerning for COVID-19 infection (fever, chills, cough, or new shortness of breath).  The patient is practicing social distancing & Masking.   She has had both her COVID-19 vaccine injections  REVIEWED OF SYSTEMS   Review of Systems  Constitutional: Negative for malaise/fatigue (Just has not really felt like doing much).  HENT: Negative for nosebleeds.   Gastrointestinal: Negative for blood in stool.  Genitourinary: Negative for hematuria.  Musculoskeletal: Negative for joint pain.  Neurological: Negative for dizziness and focal weakness.  Psychiatric/Behavioral: Negative for memory loss. The patient is not nervous/anxious and does not have insomnia.        Seems to be handling her husband's death quite well with no major depression or abnormal grieving   I have reviewed and (if needed) personally updated the patient's problem list, medications, allergies, past medical and surgical history, social and family history.   PAST MEDICAL HISTORY   Past Medical History:  Diagnosis Date  . Anxiety   . CAD (coronary artery disease)    CPET Test suggests mild potential ischemic findings -  likely Microvascular; Normal LV function on Echo, Overall Good score of CPET  . Diverticulosis    by colonosocpy  . Heart murmur    PVCs thought from MVP -- reportedly had echocardiogram in 2005 at [facility]; follow-up Echo February 2015: Normal LV size and function. EF 60-65% with no RWMA, no aortic stenosis, trivial MR with no suggestion  of prolapse,  . History of chicken pox   . History of colon polyps   . HLD (hyperlipidemia)    mild  . Malignant melanoma ([place]) 1988   L thigh, stage 3, yearly skin checks with Dr .[place] at [facility]  . Vitamin D deficiency     PAST SURGICAL HISTORY   Past Surgical History:  Procedure Laterality Date  . CARDIOVASCULAR STRESS TEST  08/2014   ETT - low risk, good exercise tolerance [name])  . CARDIOVASCULAR STRESS TEST (CPET)  2015   Low Risk Study, but evidence of early Myocardial Dysfunction  . [place]  . COLONOSCOPY  2008   small hyperplastic polyps, small int hem, early diverticula ([place]) rpt 10 yrs  . DILATATION & CURRETTAGE/HYSTEROSCOPY WITH RESECTOCOPE N/A 06/02/2013   Procedure: DILATATION & CURETTAGE/HYSTEROSCOPY WITH RESECTOCOPE;  Surgeon: Jamey Reas de Berton Lan, MD;  Location: [facility];  Service: Gynecology;  Laterality: N/A;  . DILATION AND CURETTAGE OF UTERUS  1999  . [place]  . SKIN SURGERY  1988   melanoma excision  . TUBAL LIGATION  1998  . US ECHOCARDIOGRAPHY  05/2013   WNL, likely microvascular disease    MEDICATIONS/ALLERGIES   Current Meds  Medication Sig  . ALPRAZolam (XANAX) 0.5 MG tablet Take 0.5 tablets (0.25 mg total) by mouth at bedtime as needed for sleep.  Marland Kitchen amLODipine (NORVASC) 5 MG tablet TAKE 1 TABLET BY MOUTH DAILY  . Cholecalciferol (VITAMIN D3 PO) Take 1 capsule by mouth daily. Takes 5,000 IU daily  . Coenzyme Q10 ([employer] COQ-10) 200 MG capsule Take 1 capsule (200 mg total) by mouth daily.  . Cyanocobalamin (VITAMIN B-12) 5000 MCG SUBL Place 1 tablet under the tongue daily.  . diclofenac sodium (VOLTAREN) 1 % GEL Apply 2 g topically 3 (three) times daily.  . diphenhydrAMINE (BENADRYL ALLERGY) 25 MG tablet Take 0.5 tablets (12.5 mg total) by mouth at bedtime as needed for sleep.  . Melatonin 3 MG TABS Take 1 tablet by mouth at bedtime. Spray formulation  . NON FORMULARY Take 1 capsule by mouth  daily. Tumeric  . pravastatin (PRAVACHOL) 20 MG tablet Take 1 tablet (20 mg total) by mouth every evening.  . pravastatin (PRAVACHOL) 40 MG tablet TAKE 1 TABLET BY MOUTH EVERY DAY IN THE EVENING  . Probiotic Product (PROBIOTIC PO) Take by mouth daily.  Marland Kitchen triamcinolone cream (KENALOG) 0.1 % Apply 1 application topically 2 (two) times daily. Apply to AA.  . Turmeric (QC TUMERIC COMPLEX PO) Take 1,000 mg by mouth.  . vitamin C (ASCORBIC ACID) 500 MG tablet Take 500 mg by mouth daily. Takes 2 tablets daily  . XIIDRA 5 % SOLN Place 1 drop into both eyes daily.  . Zinc 50 MG CAPS Take 1 capsule (50 mg total) by mouth daily.    No Known Allergies  SOCIAL HISTORY/FAMILY HISTORY   Reviewed in Epic:  Pertinent findings: None  OBJCTIVE -PE, EKG, labs   Wt Readings from Last 3 Encounters:  10/08/19 153 lb (69.4 kg)  11/27/18 144 lb 2 oz (65.4 kg)  10/09/18  144 lb 3 oz (65.4 kg)    Physical Exam: BP 130/78   Pulse 61   Temp (!) 97 F (36.1 C)   Ht 5\' 5"  (1.651 m)   Wt 153 lb (69.4 kg)   LMP 12/15/2011   SpO2 96%   BMI 25.46 kg/m  Physical Exam Vitals reviewed.  Constitutional:      General: She is not in acute distress.    Appearance: Normal appearance. She is normal weight.     Comments: Well-groomed.  Healthy-appearing  HENT:     Head: Normocephalic and atraumatic.  Neck:     Vascular: No carotid bruit or JVD.  Cardiovascular:     Rate and Rhythm: Normal rate and regular rhythm.  No extrasystoles are present.    Chest Wall: PMI is not displaced.     Pulses: Normal pulses.     Heart sounds: Normal heart sounds. No murmur heard.  No friction rub. No gallop.   Pulmonary:     Effort: Pulmonary effort is normal.     Breath sounds: Normal breath sounds.  Musculoskeletal:     Cervical back: Normal range of motion and neck supple.  Neurological:     General: No focal deficit present.     Mental Status: She is alert and oriented to person, place, and time.  Psychiatric:         Mood and Affect: Mood normal.        Behavior: Behavior normal.        Thought Content: Thought content normal.        Judgment: Judgment normal.      Adult ECG Report  Rate: 61 ;  Rhythm: normal sinus rhythm and Otherwise normal axis, intervals durations.;   Narrative Interpretation: Normal  Recent Labs: Due for labs rechecked soon. Lab Results  Component Value Date   CHOL 194 08/15/2018   HDL 104 08/15/2018   LDLCALC 77 08/15/2018   LDLDIRECT 124 08/09/2011   TRIG 52 08/15/2018   CHOLHDL 1.9 08/15/2018   Lab Results  Component Value Date   CREATININE 0.68 09/30/2018   BUN 19 09/30/2018   NA 141 09/30/2018   K 3.7 09/30/2018   CL 107 09/30/2018   CO2 25 09/30/2018   Lab Results  Component Value Date   TSH 1.69 09/30/2018    ASSESSMENT/PLAN    Problem List Items Addressed This Visit    Microvascular coronary disease (Chronic)    This diagnosis was suggested based on results of CPX study years ago.  Has not had any symptoms to suggest macrovascular disease. She does have family history of CAD and is on medication for hyperlipidemia.  Now that she is over 44 and is on medications, the question is is how aggressive to be truly need to be as far as treating her lipids.  We talked about screening tests and at this point the most effective screening test is coronary calcium score.  Plan: Check coronary calcium score -> this will allow [place] to adjust our target for lipids.      Relevant Orders   EKG 12-Lead (Completed)   CT CARDIAC SCORING   HLD (hyperlipidemia) - Primary (Chronic)    She had dramatic improvement in her lipids from 20 13-20 20.  Is due for having labs checked soon.  She is currently taking pravastatin 40 mg along with co-Q10 and tolerating well.  .  Plan: Checking coronary calcium score -> depending on results, may want to be more aggressive targeting LDL  less than 70 versus maintaining current regimen.      Relevant Orders   EKG 12-Lead (Completed)    CT CARDIAC SCORING   Family history of early CAD (Chronic)    Will screen with coronary calcium score      Essential hypertension (Chronic)    Blood pressure is well controlled with current meds.          COVID-19 Education: The signs and symptoms of COVID-19 were discussed with the patient and how to seek care for testing (follow up with PCP or arrange E-visit).   The importance of social distancing and COVID-19 vaccination was discussed today.  I spent a total of 18 minutes with the patient. >  50% of the time was spent in direct patient consultation.  Additional time spent with chart review  / charting (studies, outside notes, etc): 7 Total Time: 25 min   Current medicines are reviewed at length with the patient today.  (+/- concerns) N/A  Notice: This dictation was prepared with Dragon dictation along with smaller phrase technology. Any transcriptional errors that result from this process are unintentional and may not be corrected upon review.  Patient Instructions / Medication Changes & Studies & Tests Ordered   Patient Instructions  Medication Instructions:  No changes  *If you need a refill on your cardiac medications before your next appointment, please call your pharmacy*   Lab Work: Not needed   Testing/Procedures:  CT coronary calcium score. This test is done at [address]. This is $150 out of pocket.   Coronary CalciumScan A coronary calcium scan is an imaging test used to look for deposits of calcium and other fatty materials (plaques) in the inner lining of the blood vessels of the heart (coronary arteries). These deposits of calcium and plaques can partly clog and narrow the coronary arteries without producing any symptoms or warning signs. This puts a person at risk for a heart attack. This test can detect these deposits before symptoms develop. Tell a health care provider about:  Any allergies you have.  All medicines you are  taking, including vitamins, herbs, eye drops, creams, and over-the-counter medicines.  Any problems you or family members have had with anesthetic medicines.  Any blood disorders you have.  Any surgeries you have had.  Any medical conditions you have.  Whether you are pregnant or may be pregnant. What are the risks? Generally, this is a safe procedure. However, problems may occur, including:  Harm to a pregnant woman and her unborn baby. This test involves the use of radiation. Radiation exposure can be dangerous to a pregnant woman and her unborn baby. If you are pregnant, you generally should not have this procedure done.  Slight increase in the risk of cancer. This is because of the radiation involved in the test. What happens before the procedure? No preparation is needed for this procedure. What happens during the procedure?  You will undress and remove any jewelry around your neck or chest.  You will put on a hospital gown.  Sticky electrodes will be placed on your chest. The electrodes will be connected to an electrocardiogram (ECG) machine to record a tracing of the electrical activity of your heart.  A CT scanner will take pictures of your heart. During this time, you will be asked to lie still and hold your breath for 2-3 seconds while a picture of your heart is being taken. The procedure may vary among health care providers and  hospitals. What happens after the procedure?  You can get dressed.  You can return to your normal activities.  It is up to you to get the results of your test. Ask your health care provider, or the department that is doing the test, when your results will be ready. Summary  A coronary calcium scan is an imaging test used to look for deposits of calcium and other fatty materials (plaques) in the inner lining of the blood vessels of the heart (coronary arteries).  Generally, this is a safe procedure. Tell your health care provider if you are  pregnant or may be pregnant.  No preparation is needed for this procedure.  A CT scanner will take pictures of your heart.  You can return to your normal activities after the scan is done. This information is not intended to replace advice given to you by your health care provider. Make sure you discuss any questions you have with your health care provider. Document Released: 09/22/2007 Document Revised: 02/13/2016 Document Reviewed: 02/13/2016 Elsevier Interactive Patient Education  2017 [place]: At [facility], you and your health needs are our priority.  As part of our continuing mission to provide you with exceptional heart care, we have created designated Provider Care Teams.  These Care Teams include your primary Cardiologist (physician) and Advanced Practice Providers (APPs -  Physician Assistants and Nurse Practitioners) who all work together to provide you with the care you need, when you need it.  We recommend signing up for the patient portal called "MyChart".  Sign up information is provided on this After Visit Summary.  MyChart is used to connect with patients for Virtual Visits (Telemedicine).  Patients are able to view lab/test results, encounter notes, upcoming appointments, etc.  Non-urgent messages can be sent to your provider as well.   To learn more about what you can do with MyChart, go to [email or web address].    Your next appointment:   12 month(s)  The format for your next appointment:   In Person  Provider:   Glenetta Hew, MD       Studies Ordered:   Orders Placed This Encounter  Procedures  . CT CARDIAC SCORING  . EKG 12-Lead     Glenetta Hew, M.D., M.S. Interventional Cardiologist   Pager # [phone] Phone # [phone] [address]. [place], [place] [address]   Thank you for choosing Heart[facility] at [facility]!!

## 2019-10-14 ENCOUNTER — Other Ambulatory Visit: Payer: Self-pay | Admitting: Family Medicine

## 2019-10-14 DIAGNOSIS — I10 Essential (primary) hypertension: Secondary | ICD-10-CM

## 2019-10-14 DIAGNOSIS — E7849 Other hyperlipidemia: Secondary | ICD-10-CM

## 2019-10-14 DIAGNOSIS — E559 Vitamin D deficiency, unspecified: Secondary | ICD-10-CM

## 2019-10-15 ENCOUNTER — Other Ambulatory Visit (INDEPENDENT_AMBULATORY_CARE_PROVIDER_SITE_OTHER): Payer: 59

## 2019-10-15 ENCOUNTER — Other Ambulatory Visit: Payer: Self-pay

## 2019-10-15 ENCOUNTER — Encounter: Payer: Self-pay | Admitting: Cardiology

## 2019-10-15 ENCOUNTER — Telehealth: Payer: Self-pay | Admitting: *Deleted

## 2019-10-15 DIAGNOSIS — E559 Vitamin D deficiency, unspecified: Secondary | ICD-10-CM

## 2019-10-15 DIAGNOSIS — E7849 Other hyperlipidemia: Secondary | ICD-10-CM | POA: Diagnosis not present

## 2019-10-15 DIAGNOSIS — I10 Essential (primary) hypertension: Secondary | ICD-10-CM

## 2019-10-15 DIAGNOSIS — Z0000 Encounter for general adult medical examination without abnormal findings: Secondary | ICD-10-CM

## 2019-10-15 LAB — CBC WITH DIFFERENTIAL/PLATELET
Absolute Monocytes: 373 cells/uL (ref 200–950)
Basophils Absolute: 21 cells/uL (ref 0–200)
Basophils Relative: 0.5 %
Eosinophils Absolute: 0 cells/uL — ABNORMAL LOW (ref 15–500)
Eosinophils Relative: 0 %
HCT: 41.1 % (ref 35.0–45.0)
Hemoglobin: 13.8 g/dL (ref 11.7–15.5)
Lymphs Abs: 1365 cells/uL (ref 850–3900)
MCH: 32.3 pg (ref 27.0–33.0)
MCHC: 33.6 g/dL (ref 32.0–36.0)
MCV: 96.3 fL (ref 80.0–100.0)
MPV: 10.6 fL (ref 7.5–12.5)
Monocytes Relative: 9.1 %
Neutro Abs: 2341 cells/uL (ref 1500–7800)
Neutrophils Relative %: 57.1 %
Platelets: 241 10*3/uL (ref 140–400)
RBC: 4.27 10*6/uL (ref 3.80–5.10)
RDW: 12.2 % (ref 11.0–15.0)
Total Lymphocyte: 33.3 %
WBC: 4.1 10*3/uL (ref 3.8–10.8)

## 2019-10-15 NOTE — Assessment & Plan Note (Signed)
This diagnosis was suggested based on results of CPX study years ago.  Has not had any symptoms to suggest macrovascular disease. She does have family history of CAD and is on medication for hyperlipidemia.  Now that she is over 25 and is on medications, the question is is how aggressive to be truly need to be as far as treating her lipids.  We talked about screening tests and at this point the most effective screening test is coronary calcium score.  Plan: Check coronary calcium score -> this will allow [place] to adjust our target for lipids.

## 2019-10-15 NOTE — Assessment & Plan Note (Signed)
Blood pressure is well controlled with current meds.

## 2019-10-15 NOTE — Telephone Encounter (Addendum)
Pt was seen today in the lab for her lab work, pt requested that we add a CBC to her labs, I did draw for it if PCP wants to add it.   **Also pt works for [employer], please order for quest**

## 2019-10-15 NOTE — Assessment & Plan Note (Signed)
She had dramatic improvement in her lipids from 20 13-20 20.  Is due for having labs checked soon.  She is currently taking pravastatin 40 mg along with co-Q10 and tolerating well.  .  Plan: Checking coronary calcium score -> depending on results, may want to be more aggressive targeting LDL less than 70 versus maintaining current regimen.

## 2019-10-15 NOTE — Assessment & Plan Note (Signed)
Will screen with coronary calcium score

## 2019-10-15 NOTE — Telephone Encounter (Signed)
Ok to do. Thank you.  

## 2019-10-15 NOTE — Addendum Note (Signed)
Addended by: Tammi Sou on: 10/15/2019 08:21 AM   Modules accepted: Orders

## 2019-10-15 NOTE — Addendum Note (Signed)
Addended by: Ellamae Sia on: 10/15/2019 10:49 AM   Modules accepted: Orders

## 2019-10-16 ENCOUNTER — Ambulatory Visit (INDEPENDENT_AMBULATORY_CARE_PROVIDER_SITE_OTHER)
Admission: RE | Admit: 2019-10-16 | Discharge: 2019-10-16 | Disposition: A | Discharge status: Home / Self Care | Payer: Self-pay | Source: Ambulatory Visit | Attending: Cardiology | Admitting: Cardiology

## 2019-10-16 ENCOUNTER — Other Ambulatory Visit: Payer: Self-pay

## 2019-10-16 DIAGNOSIS — I2510 Atherosclerotic heart disease of native coronary artery without angina pectoris: Secondary | ICD-10-CM

## 2019-10-16 DIAGNOSIS — E7849 Other hyperlipidemia: Secondary | ICD-10-CM

## 2019-10-16 LAB — COMPREHENSIVE METABOLIC PANEL
AG Ratio: 2.1 (calc) (ref 1.0–2.5)
ALT: 12 U/L (ref 6–29)
AST: 14 U/L (ref 10–35)
Albumin: 4.2 g/dL (ref 3.6–5.1)
Alkaline phosphatase (APISO): 57 U/L (ref 37–153)
BUN: 22 mg/dL (ref 7–25)
CO2: 26 mmol/L (ref 20–32)
Calcium: 8.9 mg/dL (ref 8.6–10.4)
Chloride: 106 mmol/L (ref 98–110)
Creat: 0.61 mg/dL (ref 0.50–0.99)
Globulin: 2 g/dL (calc) (ref 1.9–3.7)
Glucose, Bld: 91 mg/dL (ref 65–99)
Potassium: 4 mmol/L (ref 3.5–5.3)
Sodium: 142 mmol/L (ref 135–146)
Total Bilirubin: 0.5 mg/dL (ref 0.2–1.2)
Total Protein: 6.2 g/dL (ref 6.1–8.1)

## 2019-10-16 LAB — LIPID PANEL
Cholesterol: 204 mg/dL — ABNORMAL HIGH (ref <200)
HDL: 97 mg/dL (ref >=50)
LDL Cholesterol (Calc): 93 mg/dL (calc)
Non-HDL Cholesterol (Calc): 107 mg/dL (calc) (ref <130)
Total CHOL/HDL Ratio: 2.1 (calc) (ref <5.0)
Triglycerides: 47 mg/dL (ref <150)

## 2019-10-16 LAB — MICROALBUMIN / CREATININE URINE RATIO
Creatinine, Urine: 78 mg/dL (ref 20–275)
Microalb Creat Ratio: 3 mcg/mg creat (ref <30)
Microalb, Ur: 0.2 mg/dL

## 2019-10-16 LAB — VITAMIN D 25 HYDROXY (VIT D DEFICIENCY, FRACTURES): Vit D, 25-Hydroxy: 58 ng/mL (ref 30–100)

## 2019-10-19 ENCOUNTER — Ambulatory Visit (INDEPENDENT_AMBULATORY_CARE_PROVIDER_SITE_OTHER): Payer: 59 | Admitting: Family Medicine

## 2019-10-19 ENCOUNTER — Other Ambulatory Visit: Payer: Self-pay

## 2019-10-19 ENCOUNTER — Encounter: Payer: Self-pay | Admitting: Family Medicine

## 2019-10-19 VITALS — BP 140/70 | HR 78 | Temp 98.2°F | Ht 64.5 in | Wt 152.0 lb

## 2019-10-19 DIAGNOSIS — I2510 Atherosclerotic heart disease of native coronary artery without angina pectoris: Secondary | ICD-10-CM | POA: Diagnosis not present

## 2019-10-19 DIAGNOSIS — E7849 Other hyperlipidemia: Secondary | ICD-10-CM | POA: Diagnosis not present

## 2019-10-19 DIAGNOSIS — E559 Vitamin D deficiency, unspecified: Secondary | ICD-10-CM

## 2019-10-19 DIAGNOSIS — I10 Essential (primary) hypertension: Secondary | ICD-10-CM

## 2019-10-19 DIAGNOSIS — Z0000 Encounter for general adult medical examination without abnormal findings: Secondary | ICD-10-CM | POA: Diagnosis not present

## 2019-10-19 DIAGNOSIS — Z8249 Family history of ischemic heart disease and other diseases of the circulatory system: Secondary | ICD-10-CM | POA: Diagnosis not present

## 2019-10-19 DIAGNOSIS — F5104 Psychophysiologic insomnia: Secondary | ICD-10-CM

## 2019-10-19 NOTE — Patient Instructions (Addendum)
Send me copy of recent iFOB to update your chart.  Continue current medicines Call GYN for appointment at your convenience.  You are doing well today. Return as needed or in 1 year for next physical. Let us know if BPs trending >140/90  Health Maintenance for Postmenopausal Women Menopause is a normal process in which your ability to get pregnant comes to an end. This process happens slowly over many months or years, usually between the ages of 74 and 39. Menopause is complete when you have missed your menstrual periods for 12 months. It is important to talk with your health care provider about some of the most common conditions that affect women after menopause (postmenopausal women). These include heart disease, cancer, and bone loss (osteoporosis). Adopting a healthy lifestyle and getting preventive care can help to promote your health and wellness. The actions you take can also lower your chances of developing some of these common conditions. What should I know about menopause? During menopause, you may get a number of symptoms, such as:  Hot flashes. These can be moderate or severe.  Night sweats.  Decrease in sex drive.  Mood swings.  Headaches.  Tiredness.  Irritability.  Memory problems.  Insomnia. Choosing to treat or not to treat these symptoms is a decision that you make with your health care provider. Do I need hormone replacement therapy?  Hormone replacement therapy is effective in treating symptoms that are caused by menopause, such as hot flashes and night sweats.  Hormone replacement carries certain risks, especially as you become older. If you are thinking about using estrogen or estrogen with progestin, discuss the benefits and risks with your health care provider. What is my risk for heart disease and stroke? The risk of heart disease, heart attack, and stroke increases as you age. One of the causes may be a change in the body's hormones during menopause. This  can affect how your body uses dietary fats, triglycerides, and cholesterol. Heart attack and stroke are medical emergencies. There are many things that you can do to help prevent heart disease and stroke. Watch your blood pressure  High blood pressure causes heart disease and increases the risk of stroke. This is more likely to develop in people who have high blood pressure readings, are of African descent, or are overweight.  Have your blood pressure checked: ? Every 3-5 years if you are 46-81 years of age. ? Every year if you are 78 years old or older. Eat a healthy diet   Eat a diet that includes plenty of vegetables, fruits, low-fat dairy products, and lean protein.  Do not eat a lot of foods that are high in solid fats, added sugars, or sodium. Get regular exercise Get regular exercise. This is one of the most important things you can do for your health. Most adults should:  Try to exercise for at least 150 minutes each week. The exercise should increase your heart rate and make you sweat (moderate-intensity exercise).  Try to do strengthening exercises at least twice each week. Do these in addition to the moderate-intensity exercise.  Spend less time sitting. Even light physical activity can be beneficial. Other tips  Work with your health care provider to achieve or maintain a healthy weight.  Do not use any products that contain nicotine or tobacco, such as cigarettes, e-cigarettes, and chewing tobacco. If you need help quitting, ask your health care provider.  Know your numbers. Ask your health care provider to check your cholesterol and  your blood sugar (glucose). Continue to have your blood tested as directed by your health care provider. Do I need screening for cancer? Depending on your health history and family history, you may need to have cancer screening at different stages of your life. This may include screening for:  Breast cancer.  Cervical cancer.  Lung  cancer.  Colorectal cancer. What is my risk for osteoporosis? After menopause, you may be at increased risk for osteoporosis. Osteoporosis is a condition in which bone destruction happens more quickly than new bone creation. To help prevent osteoporosis or the bone fractures that can happen because of osteoporosis, you may take the following actions:  If you are 3-65 years old, get at least 1,000 mg of calcium and at least 600 mg of vitamin D per day.  If you are older than age 49 but younger than age 70, get at least 1,200 mg of calcium and at least 600 mg of vitamin D per day.  If you are older than age 50, get at least 1,200 mg of calcium and at least 800 mg of vitamin D per day. Smoking and drinking excessive alcohol increase the risk of osteoporosis. Eat foods that are rich in calcium and vitamin D, and do weight-bearing exercises several times each week as directed by your health care provider. How does menopause affect my mental health? Depression may occur at any age, but it is more common as you become older. Common symptoms of depression include:  Low or sad mood.  Changes in sleep patterns.  Changes in appetite or eating patterns.  Feeling an overall lack of motivation or enjoyment of activities that you previously enjoyed.  Frequent crying spells. Talk with your health care provider if you think that you are experiencing depression. General instructions See your health care provider for regular wellness exams and vaccines. This may include:  Scheduling regular health, dental, and eye exams.  Getting and maintaining your vaccines. These include: ? Influenza vaccine. Get this vaccine each year before the flu season begins. ? Pneumonia vaccine. ? Shingles vaccine. ? Tetanus, diphtheria, and pertussis (Tdap) booster vaccine. Your health care provider may also recommend other immunizations. Tell your health care provider if you have ever been abused or do not feel safe at  home. Summary  Menopause is a normal process in which your ability to get pregnant comes to an end.  This condition causes hot flashes, night sweats, decreased interest in sex, mood swings, headaches, or lack of sleep.  Treatment for this condition may include hormone replacement therapy.  Take actions to keep yourself healthy, including exercising regularly, eating a healthy diet, watching your weight, and checking your blood pressure and blood sugar levels.  Get screened for cancer and depression. Make sure that you are up to date with all your vaccines. This information is not intended to replace advice given to you by your health care provider. Make sure you discuss any questions you have with your health care provider. Document Revised: 03/19/2018 Document Reviewed: 03/19/2018 Elsevier Patient Education  2020 [employer].

## 2019-10-19 NOTE — Progress Notes (Signed)
This visit was conducted in person.  BP 140/70 (BP Location: Left Arm, Patient Position: Sitting, Cuff Size: Normal)   Pulse 78   Temp 98.2 F (36.8 C) (Temporal)   Ht 5' 4.5" (1.638 m)   Wt 152 lb (68.9 kg)   LMP 12/15/2011   SpO2 97%   BMI 25.69 kg/m    CC: CPE Subjective:    Patient ID: [name], female    DOB: 14-Apr-1955, 64 y.o.   MRN: [id number]  HPI: [name] is a 64 y.o. female presenting on 10/19/2019 for Annual Exam   Microvascular ischemia CAD followed by Dr [name]. Strong fmhx CAD.On pravastatin 40mg  daily. Recent coronary calcium score of zero.   Preventative: Well woman with OBGYN Dr. Josefa Half -08/2017, due for f/u may need pap this year. Ongoing hot flashes, off HRT. Natural remedies were not helpful.Did not try effexor - not interested in medication at this time. 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 Recent iFOB through work normal 07/2019 - asked to bring [place] copy.  Mammogram - 06/2018 WNL, [facility]. Due.  Lung cancer screening - not eligible Flu shot yearly COVID vaccine - completed Pfizer series 05/2019  Tdap - 2012 Shingrix - 01/2018, 05/2018 [place]at [place] discussed.  Sunscreen use discussed. H/o melanoma, followed regularly by derm yearly (Dr Amy Martinique at [facility])  Non smoker  Alcohol - wine or hard seltzer on weekends (2)  Dentist q6 mo  Eye exam yearly  Caffeine: 2 cups coffee/day  Widower [name] 06/2018) 2 dogs.  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 Interim medical history since our last visit reviewed. Allergies and medications reviewed and updated. Outpatient Medications Prior to Visit  Medication Sig Dispense Refill  . ALPRAZolam (XANAX) 0.5 MG tablet Take  0.5 tablets (0.25 mg total) by mouth at bedtime as needed for sleep. 30 tablet 1  . amLODipine (NORVASC) 5 MG tablet TAKE 1 TABLET BY MOUTH DAILY 90 tablet 1  . Cholecalciferol (VITAMIN D3 PO) Take 1 capsule by mouth daily. Takes 5,000 IU daily    . Coenzyme Q10 ([employer] COQ-10) 200 MG capsule Take 1 capsule (200 mg total) by mouth daily.    . Cyanocobalamin (VITAMIN B-12) 5000 MCG SUBL Place 1 tablet under the tongue daily.    . diphenhydrAMINE (BENADRYL ALLERGY) 25 MG tablet Take 0.5 tablets (12.5 mg total) by mouth at bedtime as needed for sleep.    . Melatonin 3 MG TABS Take 1 tablet by mouth at bedtime. Spray formulation    . pravastatin (PRAVACHOL) 40 MG tablet TAKE 1 TABLET BY MOUTH EVERY DAY IN THE EVENING 90 tablet 3  . Probiotic Product (PROBIOTIC PO) Take by mouth daily.    . Turmeric (QC TUMERIC COMPLEX PO) Take 1,000 mg by mouth.    . vitamin C (ASCORBIC ACID) 500 MG tablet Take 500 mg by mouth daily. Takes 2 tablets daily    . XIIDRA 5 % SOLN Place 1 drop into both eyes daily.  11  . Zinc 50 MG CAPS Take 1 capsule (50 mg total) by mouth daily.  0  . NON FORMULARY Take 1 capsule by mouth daily. Tumeric    . diclofenac sodium (VOLTAREN) 1 % GEL Apply 2 g  topically 3 (three) times daily. 100 g 1  . pravastatin (PRAVACHOL) 20 MG tablet Take 1 tablet (20 mg total) by mouth every evening. 90 tablet 3  . triamcinolone cream (KENALOG) 0.1 % Apply 1 application topically 2 (two) times daily. Apply to AA. 45 g 0   No facility-administered medications prior to visit.     Per HPI unless specifically indicated in ROS section below Review of Systems  Constitutional: Negative for activity change, appetite change, chills, fatigue, fever and unexpected weight change.  HENT: Negative for hearing loss.   Eyes: Negative for visual disturbance.  Respiratory: Negative for cough, chest tightness, shortness of breath and wheezing.   Cardiovascular: Negative for chest pain, palpitations and leg swelling.   Gastrointestinal: Negative for abdominal distention, abdominal pain, blood in stool, constipation, diarrhea, nausea and vomiting.  Genitourinary: Negative for difficulty urinating and hematuria.  Musculoskeletal: Negative for arthralgias, myalgias and neck pain.  Skin: Negative for rash.  Neurological: Negative for dizziness, seizures, syncope and headaches.  Hematological: Negative for adenopathy. Does not bruise/bleed easily.  Psychiatric/Behavioral: Negative for dysphoric mood. The patient is not nervous/anxious.    Objective:  BP 140/70 (BP Location: Left Arm, Patient Position: Sitting, Cuff Size: Normal)   Pulse 78   Temp 98.2 F (36.8 C) (Temporal)   Ht 5' 4.5" (1.638 m)   Wt 152 lb (68.9 kg)   LMP 12/15/2011   SpO2 97%   BMI 25.69 kg/m   Wt Readings from Last 3 Encounters:  10/19/19 152 lb (68.9 kg)  10/08/19 153 lb (69.4 kg)  11/27/18 144 lb 2 oz (65.4 kg)      Physical Exam Vitals and nursing note reviewed.  Constitutional:      General: She is not in acute distress.    Appearance: Normal appearance. She is well-developed. She is not ill-appearing.  HENT:     Head: Normocephalic and atraumatic.     Right Ear: Hearing, tympanic membrane, ear canal and external ear normal.     Left Ear: Hearing, tympanic membrane, ear canal and external ear normal.  Eyes:     General: No scleral icterus.    Extraocular Movements: Extraocular movements intact.     Conjunctiva/sclera: Conjunctivae normal.     Pupils: Pupils are equal, round, and reactive to light.  Cardiovascular:     Rate and Rhythm: Normal rate and regular rhythm.     Pulses: Normal pulses.          Radial pulses are 2+ on the right side and 2+ on the left side.     Heart sounds: Normal heart sounds. No murmur heard.   Pulmonary:     Effort: Pulmonary effort is normal. No respiratory distress.     Breath sounds: Normal breath sounds. No wheezing, rhonchi or rales.  Abdominal:     General: Abdomen is flat.  Bowel sounds are normal. There is no distension.     Palpations: Abdomen is soft. There is no mass.     Tenderness: There is no abdominal tenderness. There is no guarding or rebound.     Hernia: No hernia is present.  Musculoskeletal:        General: Normal range of motion.     Cervical back: Normal range of motion and neck supple.     Right lower leg: No edema.     Left lower leg: No edema.  Lymphadenopathy:     Cervical: No cervical adenopathy.  Skin:    General: Skin is warm and dry.  Findings: No rash.  Neurological:     General: No focal deficit present.     Mental Status: She is alert and oriented to person, place, and time.     Comments: CN grossly intact, station and gait intact  Psychiatric:        Mood and Affect: Mood normal.        Behavior: Behavior normal.        Thought Content: Thought content normal.        Judgment: Judgment normal.       Results for orders placed or performed in visit on 10/15/19  Comprehensive metabolic panel  Result Value Ref Range   Glucose, Bld 91 65 - 99 mg/dL   BUN 22 7 - 25 mg/dL   Creat 0.61 0.50 - 0.99 mg/dL   BUN/Creatinine Ratio NOT APPLICABLE 6 - 22 (calc)   Sodium 142 135 - 146 mmol/L   Potassium 4.0 3.5 - 5.3 mmol/L   Chloride 106 98 - 110 mmol/L   CO2 26 20 - 32 mmol/L   Calcium 8.9 8.6 - 10.4 mg/dL   Total Protein 6.2 6.1 - 8.1 g/dL   Albumin 4.2 3.6 - 5.1 g/dL   Globulin 2.0 1.9 - 3.7 g/dL (calc)   AG Ratio 2.1 1.0 - 2.5 (calc)   Total Bilirubin 0.5 0.2 - 1.2 mg/dL   Alkaline phosphatase (APISO) 57 37 - 153 U/L   AST 14 10 - 35 U/L   ALT 12 6 - 29 U/L  Lipid panel  Result Value Ref Range   Cholesterol 204 (H) <200 mg/dL   HDL 97 > OR = 50 mg/dL   Triglycerides 47 <150 mg/dL   LDL Cholesterol (Calc) 93 mg/dL (calc)   Total CHOL/HDL Ratio 2.1 <5.0 (calc)   Non-HDL Cholesterol (Calc) 107 <130 mg/dL (calc)  Microalbumin / creatinine urine ratio  Result Value Ref Range   Creatinine, Urine 78 20 - 275 mg/dL    Microalb, Ur 0.2 mg/dL   Microalb Creat Ratio 3 <30 mcg/mg creat  VITAMIN D 25 Hydroxy (Vit-D Deficiency, Fractures)  Result Value Ref Range   Vit D, 25-Hydroxy 58 30 - 100 ng/mL  CBC with Differential/Platelet  Result Value Ref Range   WBC 4.1 3.8 - 10.8 Thousand/uL   RBC 4.27 3.80 - 5.10 Million/uL   Hemoglobin 13.8 11.7 - 15.5 g/dL   HCT 41.1 35 - 45 %   MCV 96.3 80.0 - 100.0 fL   MCH 32.3 27.0 - 33.0 pg   MCHC 33.6 32.0 - 36.0 g/dL   RDW 12.2 11.0 - 15.0 %   Platelets 241 140 - 400 Thousand/uL   MPV 10.6 7.5 - 12.5 fL   Neutro Abs 2,341 1,500 - 7,800 cells/uL   Lymphs Abs 1,365 850 - 3,900 cells/uL   Absolute Monocytes 373 200 - 950 cells/uL   Eosinophils Absolute 0 (L) 15 - 500 cells/uL   Basophils Absolute 21 0 - 200 cells/uL   Neutrophils Relative % 57.1 %   Total Lymphocyte 33.3 %   Monocytes Relative 9.1 %   Eosinophils Relative 0.0 %   Basophils Relative 0.5 %   Assessment & Plan:  This visit occurred during the SARS-CoV-2 public health emergency.  Safety protocols were in place, including screening questions prior to the visit, additional usage of staff PPE, and extensive cleaning of exam room while observing appropriate contact time as indicated for disinfecting solutions.   Problem List Items Addressed This Visit    Vitamin D  deficiency    Well controlled on daily 5000 IU replacement.       Microvascular coronary disease (Chronic)   HLD (hyperlipidemia) (Chronic)    Chronic, stable on pravastatin 40mg  daily. Will defer further recs to cards in setting of recent calcium score of zero- likely will continue at current dose given microvascular disease and fmhx.  The 10-year ASCVD risk score Mikey Bussing DC Brooke Bonito., et al., 2013) is: 4.8%   Values used to calculate the score:     Age: 55 years     Sex: Female     Is Non-Hispanic African American: No     Diabetic: No     Tobacco smoker: No     Systolic Blood Pressure: 962 mmHg     Is BP treated: Yes     HDL Cholesterol: 97  mg/dL     Total Cholesterol: 204 mg/dL       Health maintenance examination - Primary    Preventative protocols reviewed and updated unless pt declined. Discussed healthy diet and lifestyle.       Family history of early CAD (Chronic)   Essential hypertension (Chronic)    Chronic, adequate today. I asked her to let me know if trending >952 systolic. Would consider increased amlodipine dose.       Chronic insomnia    Chronic, stable on melatonin and sparing xanax.          No orders of the defined types were placed in this encounter.  No orders of the defined types were placed in this encounter.   Patient instructions: Send me copy of recent iFOB to update your chart.  Continue current medicines Call GYN for appointment at your convenience.  You are doing well today. Return as needed or in 1 year for next physical. Let us know if BPs trending >140/90  Follow up plan: Return in about 1 year (around 10/18/2020) for annual exam, prior fasting for blood work.  Ria Bush, MD

## 2019-10-21 NOTE — Assessment & Plan Note (Signed)
Preventative protocols reviewed and updated unless pt declined. Discussed healthy diet and lifestyle.  

## 2019-10-21 NOTE — Assessment & Plan Note (Signed)
Chronic, stable on melatonin and sparing xanax.

## 2019-10-21 NOTE — Assessment & Plan Note (Addendum)
Chronic, stable on pravastatin 40mg  daily. Will defer further recs to cards in setting of recent calcium score of zero- likely will continue at current dose given microvascular disease and fmhx.  The 10-year ASCVD risk score [name]., et al., 2013) is: 4.8%   Values used to calculate the score:     Age: 65 years     Sex: Female     Is Non-Hispanic African American: No     Diabetic: No     Tobacco smoker: No     Systolic Blood Pressure: 414 mmHg     Is BP treated: Yes     HDL Cholesterol: 97 mg/dL     Total Cholesterol: 204 mg/dL

## 2019-10-21 NOTE — Assessment & Plan Note (Signed)
Well controlled on daily 5000 IU replacement.

## 2019-10-21 NOTE — Assessment & Plan Note (Signed)
Chronic, adequate today. I asked her to let me know if trending >783 systolic. Would consider increased amlodipine dose.

## 2020-01-05 ENCOUNTER — Other Ambulatory Visit: Payer: Self-pay | Admitting: Family Medicine

## 2020-01-12 LAB — HM MAMMOGRAPHY

## 2020-01-18 ENCOUNTER — Encounter: Payer: Self-pay | Admitting: Family Medicine

## 2020-05-09 ENCOUNTER — Telehealth: Payer: Self-pay | Admitting: Cardiology

## 2020-05-09 NOTE — Telephone Encounter (Signed)
Patient c/o Palpitations:  High priority if patient c/o lightheadedness, shortness of breath, or chest pain  1) How long have you had palpitations/irregular HR/ Afib? Are you having the symptoms now? 2 weeks, yes  2) Are you currently experiencing lightheadedness, SOB or CP? no  3) Do you have a history of afib (atrial fibrillation) or irregular heart rhythm? yes  4) Have you checked your BP or HR? (document readings if available): normal BP and HR  5) Are you experiencing any other symptoms? No   Patient states for 2 weeks she has been having a fluttering in her heart. She states it is not constant, but happens a couple times every hour. She has an appointment 05/12/2020 and states she can wait until her appt and does not need a call back. She states she will call back if she becomes concerned.

## 2020-05-09 NOTE — Telephone Encounter (Signed)
Follow up scheduled

## 2020-05-12 ENCOUNTER — Ambulatory Visit: Payer: 59 | Admitting: Cardiology

## 2020-09-07 ENCOUNTER — Other Ambulatory Visit: Payer: Self-pay | Admitting: Cardiology

## 2020-10-06 ENCOUNTER — Other Ambulatory Visit: Payer: Self-pay | Admitting: Cardiology

## 2020-10-06 NOTE — Telephone Encounter (Signed)
Rx(s) sent to pharmacy electronically.  

## 2020-10-10 ENCOUNTER — Encounter: Payer: Self-pay | Admitting: Family Medicine
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
## 2020-10-11 ENCOUNTER — Encounter: Payer: Self-pay | Admitting: Cardiology

## 2020-10-11 ENCOUNTER — Other Ambulatory Visit: Payer: Self-pay

## 2020-10-11 ENCOUNTER — Ambulatory Visit: Payer: 59 | Admitting: Cardiology

## 2020-10-11 VITALS — BP 144/80 | HR 70 | Ht 65.0 in | Wt 157.2 lb

## 2020-10-11 DIAGNOSIS — I2510 Atherosclerotic heart disease of native coronary artery without angina pectoris: Secondary | ICD-10-CM | POA: Diagnosis not present

## 2020-10-11 DIAGNOSIS — I10 Essential (primary) hypertension: Secondary | ICD-10-CM | POA: Diagnosis not present

## 2020-10-11 DIAGNOSIS — E785 Hyperlipidemia, unspecified: Secondary | ICD-10-CM

## 2020-10-11 DIAGNOSIS — Z8249 Family history of ischemic heart disease and other diseases of the circulatory system: Secondary | ICD-10-CM

## 2020-10-11 DIAGNOSIS — R002 Palpitations: Secondary | ICD-10-CM

## 2020-10-11 MED ORDER — AMLODIPINE BESYLATE 5 MG PO TABS: 10.0000 mg | ORAL_TABLET | Freq: Every day | ORAL | 3 refills | Status: DC

## 2020-10-11 MED ORDER — PROPRANOLOL HCL 10 MG PO TABS: 10.0000 mg | ORAL_TABLET | Freq: Every day | ORAL | 11 refills | Status: DC

## 2020-10-11 NOTE — Patient Instructions (Signed)
Medication Instructions:  Start  propranolol 10 mg as needed for palpaiton  Starting now double Amlodipine  10 mg ( 2 X 5 mg)  if blood pressure is better and no swelling  your primary can  revaluate and change your medication to 10 mg daily   *If you need a refill on your cardiac medications before your next appointment, please call your pharmacy*   Lab Work: Not needed  If you have labs (blood work) drawn today and your tests are completely normal, you will receive your results only by: [place] (if you have MyChart) OR A paper copy in the mail If you have any lab test that is abnormal or we need to change your treatment, we will call you to review the results.   Testing/Procedures: Not needed   Follow-Up: At [facility], you and your health needs are our priority.  As part of our continuing mission to provide you with exceptional heart care, we have created designated Provider Care Teams.  These Care Teams include your primary Cardiologist (physician) and Advanced Practice Providers (APPs -  Physician Assistants and Nurse Practitioners) who all work together to provide you with the care you need, when you need it.  We recommend signing up for the patient portal called "MyChart".  Sign up information is provided on this After Visit Summary.  MyChart is used to connect with patients for Virtual Visits (Telemedicine).  Patients are able to view lab/test results, encounter notes, upcoming appointments, etc.  Non-urgent messages can be sent to your provider as well.   To learn more about what you can do with MyChart, go to [email or web address].    Your next appointment:   12 month(s)  The format for your next appointment:   In Person  Provider:   Glenetta Hew, MD   Other Instructions   DR [name] RECOMMENDS THAT YOU  PURCHASES  " Kardia" By [employer]. FROM THE  GOOGLE[employer]  APP PLAY STORE.   THE APP IS FREE , BUT THE  EQUIPMENT HAS A COST. IT IS A WAY FOR YOU  TO OBTAIN A RECORDING OF YOUR HEART RATE . IT WILL BE A SHORT RHYTHM  STRIP THAT YOU CAN SHOW TO MEDICAL STAFF.  ALSO , YOU MAY [place] - EKG- MONITOR YOU ARE ABLE TO KEEP RECORDINGS ON YOUR SMARTPHONE , INSTEAD OF PURCHASING A SEPARATE EQUIPMENT   Dr [name]  recommends portable  ECG monitor  to capture heart rate reading. There is a monitor that can be purchased on [employer]. The  monitor name is "EMAY"  at last checked it cost $79.  Our understanding there no more cost of that purchasing the machine. You will need a computer to be able to upload your recording and to be printed. It also come with a  USB to charge the monitor.

## 2020-10-12 ENCOUNTER — Other Ambulatory Visit: Payer: Self-pay | Admitting: Family Medicine

## 2020-10-12 DIAGNOSIS — E559 Vitamin D deficiency, unspecified: Secondary | ICD-10-CM

## 2020-10-12 DIAGNOSIS — E785 Hyperlipidemia, unspecified: Secondary | ICD-10-CM

## 2020-10-12 DIAGNOSIS — I10 Essential (primary) hypertension: Secondary | ICD-10-CM

## 2020-10-13 ENCOUNTER — Other Ambulatory Visit: Payer: Self-pay

## 2020-10-13 ENCOUNTER — Other Ambulatory Visit (INDEPENDENT_AMBULATORY_CARE_PROVIDER_SITE_OTHER): Payer: 59

## 2020-10-13 DIAGNOSIS — E559 Vitamin D deficiency, unspecified: Secondary | ICD-10-CM

## 2020-10-13 DIAGNOSIS — I10 Essential (primary) hypertension: Secondary | ICD-10-CM

## 2020-10-13 DIAGNOSIS — E785 Hyperlipidemia, unspecified: Secondary | ICD-10-CM

## 2020-10-13 NOTE — Addendum Note (Signed)
Addended by: Ellamae Sia on: 10/13/2020 08:47 AM   Modules accepted: Orders

## 2020-10-14 LAB — COMPREHENSIVE METABOLIC PANEL
AG Ratio: 2.3 (calc) (ref 1.0–2.5)
ALT: 16 U/L (ref 6–29)
AST: 17 U/L (ref 10–35)
Albumin: 4.6 g/dL (ref 3.6–5.1)
Alkaline phosphatase (APISO): 66 U/L (ref 37–153)
BUN: 17 mg/dL (ref 7–25)
CO2: 29 mmol/L (ref 20–32)
Calcium: 9.2 mg/dL (ref 8.6–10.4)
Chloride: 103 mmol/L (ref 98–110)
Creat: 0.62 mg/dL (ref 0.50–0.99)
Globulin: 2 g/dL (calc) (ref 1.9–3.7)
Glucose, Bld: 84 mg/dL (ref 65–99)
Potassium: 4.1 mmol/L (ref 3.5–5.3)
Sodium: 141 mmol/L (ref 135–146)
Total Bilirubin: 0.4 mg/dL (ref 0.2–1.2)
Total Protein: 6.6 g/dL (ref 6.1–8.1)

## 2020-10-14 LAB — LIPID PANEL
Cholesterol: 216 mg/dL — ABNORMAL HIGH (ref <200)
HDL: 128 mg/dL (ref >=50)
LDL Cholesterol (Calc): 75 mg/dL (calc)
Non-HDL Cholesterol (Calc): 88 mg/dL (calc) (ref <130)
Total CHOL/HDL Ratio: 1.7 (calc) (ref <5.0)
Triglycerides: 45 mg/dL (ref <150)

## 2020-10-14 LAB — MICROALBUMIN / CREATININE URINE RATIO
Creatinine, Urine: 39 mg/dL (ref 20–275)
Microalb, Ur: <0.2 mg/dL

## 2020-10-14 LAB — TSH: TSH: 2.33 mIU/L (ref 0.40–4.50)

## 2020-10-14 LAB — VITAMIN D 25 HYDROXY (VIT D DEFICIENCY, FRACTURES): Vit D, 25-Hydroxy: 53 ng/mL (ref 30–100)

## 2020-10-15 ENCOUNTER — Encounter: Payer: Self-pay | Admitting: Cardiology

## 2020-10-15 DIAGNOSIS — R002 Palpitations: Secondary | ICD-10-CM | POA: Insufficient documentation

## 2020-10-15 NOTE — Assessment & Plan Note (Signed)
With a coronary cath score of 0, not likely to have significant microvascular disease.  She does have familial hyperlipidemia and is tolerating her current dose of pravastatin.  With low calcium score, no need for aspirin at this point.  Continue lipid and blood pressure control.

## 2020-10-15 NOTE — Assessment & Plan Note (Addendum)
Blood pressures have been running higher of late.  Currently 144/80.  At this point I think the easiest thing would be to simply increase the dose of medication that she is on.  Need to make sure that she does not have any significant edema as a result.  For now she will increase her amlodipine to 10 mg daily until she sees her PCP.  She is due to see him next week.  Depending what her blood pressures look like, we will potentially just simply switch her to 10 mg, however if she is having worsening edema would stay at 5 mg and recommend adding an ARB.

## 2020-10-15 NOTE — Assessment & Plan Note (Signed)
Good news with coronary calcium score of 0. Continue with strict modification with blood pressure and lipid control as noted

## 2020-10-15 NOTE — Assessment & Plan Note (Addendum)
She continues to be on pravastatin 40 mg daily.  She is due for labs to be checked by PCP soon.  She asks if she can go ahead and get the labs drawn prior to her PCP follow-up.  She did have a pretty significant improvement in her lipids after starting statin and increasing the dose.  At this point, based on her family history, she is anxious to be aggressive with primary prevention.  Recent labs been well within goal.  Lipids ordered.

## 2020-10-15 NOTE — Assessment & Plan Note (Signed)
At this point, she is not having her spells with enough frequency to captured on the monitor.  They are somewhat bothersome to her when they occur and it seems to be associated with psychological stress.  Plan:  We discussed using either apple watch (if it is advanced enough to have the software for rhythm strip monitoring) or purchasing a portable Kardia monitor.  This may, she can capture the spells so we can know what is retreating.  I suspect that is probably just short little bursts of PAT or simply just bigeminy.  Will also provide PRN propranolol 10 mg for palpitations.

## 2020-10-19 ENCOUNTER — Other Ambulatory Visit: Payer: Self-pay

## 2020-10-19 ENCOUNTER — Ambulatory Visit (INDEPENDENT_AMBULATORY_CARE_PROVIDER_SITE_OTHER): Payer: 59 | Admitting: Family Medicine

## 2020-10-19 ENCOUNTER — Encounter: Payer: Self-pay | Admitting: Family Medicine

## 2020-10-19 VITALS — BP 136/78 | HR 65 | Temp 98.1°F

## 2020-10-19 DIAGNOSIS — E559 Vitamin D deficiency, unspecified: Secondary | ICD-10-CM

## 2020-10-19 DIAGNOSIS — Z0000 Encounter for general adult medical examination without abnormal findings: Secondary | ICD-10-CM

## 2020-10-19 DIAGNOSIS — I10 Essential (primary) hypertension: Secondary | ICD-10-CM

## 2020-10-19 DIAGNOSIS — G4762 Sleep related leg cramps: Secondary | ICD-10-CM | POA: Insufficient documentation

## 2020-10-19 DIAGNOSIS — E785 Hyperlipidemia, unspecified: Secondary | ICD-10-CM

## 2020-10-19 NOTE — Assessment & Plan Note (Signed)
Chronic, improved on higher amlodipine dose without pedal edema - will continue this.

## 2020-10-19 NOTE — Assessment & Plan Note (Signed)
Preventative protocols reviewed and updated unless pt declined. Discussed healthy diet and lifestyle.  

## 2020-10-19 NOTE — Assessment & Plan Note (Signed)
Discussed supportive home measures  rec good water intake, night time stretches, update if ongoing to consider checking CPK and Mg levels.

## 2020-10-19 NOTE — Progress Notes (Signed)
Patient ID: [name], female    DOB: 29-Apr-1955, 65 y.o.   MRN: [id number]  This visit was conducted in person.  BP 136/78   Pulse 65   Temp 98.1 F (36.7 C) (Temporal)   Ht (P) 5\' 5"  (1.651 m)   Wt (P) 155 lb (70.3 kg)   LMP 12/15/2011   SpO2 (P) 98%   BMI (P) 25.79 kg/m   BP Readings from Last 3 Encounters:  10/19/20 136/78  10/11/20 (!) 144/80  10/19/19 140/70    CC: CPE Subjective:   HPI: BEENA CATANO is a 65 y.o. female presenting on 10/19/2020 for Annual Exam   ?Microvascular ischemia CAD followed by Dr [name]. Strong fmhx CAD. On pravastatin 40mg  daily. reassuring coronary calcium score of zero. Heart palpitations not captured on monitor - considering Kardia monitor, PRN propranolol 10mg .   HTN - cards recently increased amlodipine to 10mg  daily given elevated BP readings - bp better controlled today without significant pedal edema   Notes cramping to calves and feet. More recently noticing tender discomfort and cramping to left calf.   Preventative: COLONOSCOPY Date: 2008 small hyperplastic polyps, small int hem, early diverticula ([place]) rpt 10 yrs. iFOB through work normal 06/2020.  Mammogram - 01/2020 Ezzie Dural. Due. Well woman with OBGYN Dr. Josefa Half - 08/2017, due for f/u may need pap this year. Ongoing hot flashes, off HRT. Natural remedies were not helpful. Did not try effexor - not interested in medication at this time.  Lung cancer screening - not eligible Flu shot yearly COVID vaccine - [place] 04/2019, 05/2019, booster 01/2020   Tdap - 2012  Shingrix - 01/2018, 05/2018  Seat belt [place] discussed. Sunscreen use discussed. H/o melanoma, followed regularly by derm yearly (Dr Amy Martinique at [facility])  Non smoker Alcohol - wine or hard seltzer 4/7 days per week (2-3 glasses)  Dentist q6 mo Eye exam yearly  Caffeine: 2 cups coffee/day   Widower Patrick Jupiter 06/2018) 2 dogs.   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 Interim medical history since our last visit reviewed. Allergies and medications reviewed and updated. Outpatient Medications Prior to Visit  Medication Sig Dispense Refill   ALPRAZolam (XANAX) 0.5 MG tablet Take 0.5 tablets (0.25 mg total) by mouth at bedtime as needed for sleep. 30 tablet 1   amLODipine (NORVASC) 5 MG tablet Take 2 tablets (10 mg total) by mouth daily. 90 tablet 3   Cholecalciferol (VITAMIN D3 PO) Take 1 capsule by mouth daily. Takes 5,000 IU daily     Coenzyme Q10 ([employer] COQ-10) 200 MG capsule Take 1 capsule (200 mg total) by mouth daily.     Cyanocobalamin (VITAMIN B-12) 5000 MCG SUBL Place 1 tablet under the tongue daily.     diphenhydrAMINE (BENADRYL ALLERGY) 25 MG tablet Take 0.5 tablets (12.5 mg total) by mouth at bedtime as needed for sleep.     Melatonin 3 MG TABS Take 1 tablet by mouth at bedtime. Spray formulation     pravastatin (PRAVACHOL) 40 MG tablet Take 1 tablet (40 mg total) by mouth daily. Need Appt 90 tablet 0   Probiotic Product (PROBIOTIC PO) Take by mouth daily.     propranolol (INDERAL) 10 MG tablet Take 1 tablet (10 mg total) by mouth daily. 10  tablet 11   Turmeric (QC TUMERIC COMPLEX PO) Take 1,000 mg by mouth.     vitamin C (ASCORBIC ACID) 500 MG tablet Take 500 mg by mouth daily. Takes 2 tablets daily     XIIDRA 5 % SOLN Place 1 drop into both eyes daily.  11   Zinc 50 MG CAPS Take 1 capsule (50 mg total) by mouth daily.  0   No facility-administered medications prior to visit.     Per HPI unless specifically indicated in ROS section below Review of Systems  Constitutional:  Negative for activity change, appetite change, chills, fatigue, fever and unexpected weight change.  HENT:  Negative for hearing loss.   Eyes:  Negative for visual disturbance.  Respiratory:   Negative for cough, chest tightness, shortness of breath and wheezing.   Cardiovascular:  Negative for chest pain, palpitations and leg swelling.  Gastrointestinal:  Negative for abdominal distention, abdominal pain, blood in stool, constipation, diarrhea, nausea and vomiting.  Genitourinary:  Negative for difficulty urinating and hematuria.  Musculoskeletal:  Negative for arthralgias, myalgias and neck pain.  Skin:  Negative for rash.  Neurological:  Negative for dizziness, seizures, syncope and headaches.  Hematological:  Negative for adenopathy. Does not bruise/bleed easily.  Psychiatric/Behavioral:  Negative for dysphoric mood. The patient is not nervous/anxious.    Objective:  BP 136/78   Pulse 65   Temp 98.1 F (36.7 C) (Temporal)   Ht (P) 5\' 5"  (1.651 m)   Wt (P) 155 lb (70.3 kg)   LMP 12/15/2011   SpO2 (P) 98%   BMI (P) 25.79 kg/m   Wt Readings from Last 3 Encounters:  10/19/20 (P) 155 lb (70.3 kg)  10/11/20 157 lb 3.2 oz (71.3 kg)  10/19/19 152 lb (68.9 kg)      Physical Exam Vitals and nursing note reviewed.  Constitutional:      Appearance: Normal appearance. She is not ill-appearing.  HENT:     Head: Normocephalic and atraumatic.     Right Ear: Tympanic membrane, ear canal and external ear normal. There is no impacted cerumen.     Left Ear: Tympanic membrane, ear canal and external ear normal. There is no impacted cerumen.  Eyes:     General:        Right eye: No discharge.        Left eye: No discharge.     Extraocular Movements: Extraocular movements intact.     Conjunctiva/sclera: Conjunctivae normal.     Pupils: Pupils are equal, round, and reactive to light.  Neck:     Thyroid: No thyroid mass or thyromegaly.  Cardiovascular:     Rate and Rhythm: Normal rate and regular rhythm.     Pulses: Normal pulses.     Heart sounds: Normal heart sounds. No murmur heard. Pulmonary:     Effort: Pulmonary effort is normal. No respiratory distress.     Breath  sounds: Normal breath sounds. No wheezing, rhonchi or rales.  Abdominal:     General: Bowel sounds are normal. There is no distension.     Palpations: Abdomen is soft. There is no mass.     Tenderness: no abdominal tenderness There is no guarding or rebound.     Hernia: No hernia is present.  Musculoskeletal:     Cervical back: Normal range of motion and neck supple. No rigidity.     Right lower leg: No edema.     Left lower leg: No edema.  Lymphadenopathy:     Cervical:  No cervical adenopathy.  Skin:    General: Skin is warm and dry.     Findings: No erythema or rash.     Comments:  2+ DP bilaterally No palpable cords  Neurological:     General: No focal deficit present.     Mental Status: She is alert. Mental status is at baseline.  Psychiatric:        Mood and Affect: Mood normal.        Behavior: Behavior normal.      Results for orders placed or performed in visit on 10/13/20  VITAMIN D 25 Hydroxy (Vit-D Deficiency, Fractures)  Result Value Ref Range   Vit D, 25-Hydroxy 53 30 - 100 ng/mL  Lipid panel  Result Value Ref Range   Cholesterol 216 (H) <200 mg/dL   HDL 128 > OR = 50 mg/dL   Triglycerides 45 <150 mg/dL   LDL Cholesterol (Calc) 75 mg/dL (calc)   Total CHOL/HDL Ratio 1.7 <5.0 (calc)   Non-HDL Cholesterol (Calc) 88 <130 mg/dL (calc)  Comprehensive metabolic panel  Result Value Ref Range   Glucose, Bld 84 65 - 99 mg/dL   BUN 17 7 - 25 mg/dL   Creat 0.62 0.50 - 0.99 mg/dL   BUN/Creatinine Ratio NOT APPLICABLE 6 - 22 (calc)   Sodium 141 135 - 146 mmol/L   Potassium 4.1 3.5 - 5.3 mmol/L   Chloride 103 98 - 110 mmol/L   CO2 29 20 - 32 mmol/L   Calcium 9.2 8.6 - 10.4 mg/dL   Total Protein 6.6 6.1 - 8.1 g/dL   Albumin 4.6 3.6 - 5.1 g/dL   Globulin 2.0 1.9 - 3.7 g/dL (calc)   AG Ratio 2.3 1.0 - 2.5 (calc)   Total Bilirubin 0.4 0.2 - 1.2 mg/dL   Alkaline phosphatase (APISO) 66 37 - 153 U/L   AST 17 10 - 35 U/L   ALT 16 6 - 29 U/L  Microalbumin / creatinine  urine ratio  Result Value Ref Range   Creatinine, Urine 39 20 - 275 mg/dL   Microalb, Ur <0.2 mg/dL   Microalb Creat Ratio NOTE <30 mcg/mg creat  TSH  Result Value Ref Range   TSH 2.33 0.40 - 4.50 mIU/L    Assessment & Plan:  This visit occurred during the SARS-CoV-2 public health emergency.  Safety protocols were in place, including screening questions prior to the visit, additional usage of staff PPE, and extensive cleaning of exam room while observing appropriate contact time as indicated for disinfecting solutions.   Problem List Items Addressed This Visit     Hyperlipidemia with target LDL less than 100 (Chronic)    Chronic, stable on pravastatin - continue. The ASCVD Risk score Mikey Bussing DC Jr., et al., 2013) failed to calculate for the following reasons:   The valid HDL cholesterol range is 20 to 100 mg/dL        Essential hypertension (Chronic)    Chronic, improved on higher amlodipine dose without pedal edema - will continue this.        Vitamin D deficiency    Continue 5000 IU daily vit D replacement.        Health maintenance examination - Primary    Preventative protocols reviewed and updated unless pt declined. Discussed healthy diet and lifestyle.        Nocturnal leg cramps    Discussed supportive home measures  rec good water intake, night time stretches, update if ongoing to consider checking CPK and Mg levels.  No orders of the defined types were placed in this encounter.  No orders of the defined types were placed in this encounter.   Patient instructions: Schedule well woman exam at your convenience Consider tetanus shot next year You are doing well today Return as needed or in 1 year for next physical.  For leg cramping - ensure good hydration status, increase potassium rich foods, stretch legs before bedtime, let me know if ongoing or worsening to consider further labwork.   Follow up plan: Return in about 1 year (around 10/19/2021)  for annual exam, prior fasting for blood work.  Ria Bush, MD

## 2020-10-19 NOTE — Assessment & Plan Note (Signed)
Chronic, stable on pravastatin - continue. The ASCVD Risk score Mikey Bussing DC Jr., et al., 2013) failed to calculate for the following reasons:   The valid HDL cholesterol range is 20 to 100 mg/dL

## 2020-10-19 NOTE — Assessment & Plan Note (Signed)
Continue 5000 IU daily vit D replacement.

## 2020-10-19 NOTE — Patient Instructions (Addendum)
Schedule well woman exam at your convenience Consider tetanus shot next year You are doing well today Return as needed or in 1 year for next physical.  For leg cramping - ensure good hydration status, increase potassium rich foods, stretch legs before bedtime, let me know if ongoing or worsening to consider further labwork.   Health Maintenance for Postmenopausal Women Menopause is a normal process in which your ability to get pregnant comes to an end. This process happens slowly over many months or years, usually between the ages of 72 and 110. Menopause is complete when you have missed your menstrualperiods for 12 months. It is important to talk with your health care provider about some of the most common conditions that affect women after menopause (postmenopausal women). These include heart disease, cancer, and bone loss (osteoporosis). Adopting a healthy lifestyle and getting preventive care can help to promote your health and wellness. The actions you take can also lower your chances ofdeveloping some of these common conditions. What should I know about menopause? During menopause, you may get a number of symptoms, such as: Hot flashes. These can be moderate or severe. Night sweats. Decrease in sex drive. Mood swings. Headaches. Tiredness. Irritability. Memory problems. Insomnia. Choosing to treat or not to treat these symptoms is a decision that you makewith your health care provider. Do I need hormone replacement therapy? Hormone replacement therapy is effective in treating symptoms that are caused by menopause, such as hot flashes and night sweats. Hormone replacement carries certain risks, especially as you become older. If you are thinking about using estrogen or estrogen with progestin, discuss the benefits and risks with your health care provider. What is my risk for heart disease and stroke? The risk of heart disease, heart attack, and stroke increases as you age. One of the  causes may be a change in the body's hormones during menopause. This can affect how your body uses dietary fats, triglycerides, and cholesterol. Heart attack and stroke are medical emergencies. There are many things that you cando to help prevent heart disease and stroke. Watch your blood pressure High blood pressure causes heart disease and increases the risk of stroke. This is more likely to develop in people who have high blood pressure readings, are of African descent, or are overweight. Have your blood pressure checked: Every 3-5 years if you are 66-70 years of age. Every year if you are 75 years old or older. Eat a healthy diet  Eat a diet that includes plenty of vegetables, fruits, low-fat dairy products, and lean protein. Do not eat a lot of foods that are high in solid fats, added sugars, or sodium.  Get regular exercise Get regular exercise. This is one of the most important things you can do for your health. Most adults should: Try to exercise for at least 150 minutes each week. The exercise should increase your heart rate and make you sweat (moderate-intensity exercise). Try to do strengthening exercises at least twice each week. Do these in addition to the moderate-intensity exercise. Spend less time sitting. Even light physical activity can be beneficial. Other tips Work with your health care provider to achieve or maintain a healthy weight. Do not use any products that contain nicotine or tobacco, such as cigarettes, e-cigarettes, and chewing tobacco. If you need help quitting, ask your health care provider. Know your numbers. Ask your health care provider to check your cholesterol and your blood sugar (glucose). Continue to have your blood tested as directed by your  health care provider. Do I need screening for cancer? Depending on your health history and family history, you may need to have cancer screening at different stages of your life. This may include screening  for: Breast cancer. Cervical cancer. Lung cancer. Colorectal cancer. What is my risk for osteoporosis? After menopause, you may be at increased risk for osteoporosis. Osteoporosis is a condition in which bone destruction happens more quickly than new bone creation. To help prevent osteoporosis or the bone fractures that can happen because of osteoporosis, you may take the following actions: If you are 72-33 years old, get at least 1,000 mg of calcium and at least 600 mg of vitamin D per day. If you are older than age 54 but younger than age 11, get at least 1,200 mg of calcium and at least 600 mg of vitamin D per day. If you are older than age 89, get at least 1,200 mg of calcium and at least 800 mg of vitamin D per day. Smoking and drinking excessive alcohol increase the risk of osteoporosis. Eat foods that are rich in calcium and vitamin D, and do weight-bearing exercisesseveral times each week as directed by your health care provider. How does menopause affect my mental health? Depression may occur at any age, but it is more common as you become older. Common symptoms of depression include: Low or sad mood. Changes in sleep patterns. Changes in appetite or eating patterns. Feeling an overall lack of motivation or enjoyment of activities that you previously enjoyed. Frequent crying spells. Talk with your health care provider if you think that you are experiencingdepression. General instructions See your health care provider for regular wellness exams and vaccines. This may include: Scheduling regular health, dental, and eye exams. Getting and maintaining your vaccines. These include: Influenza vaccine. Get this vaccine each year before the flu season begins. Pneumonia vaccine. Shingles vaccine. Tetanus, diphtheria, and pertussis (Tdap) booster vaccine. Your health care provider may also recommend other immunizations. Tell your health care provider if you have ever been abused or do not  feel safeat home. Summary Menopause is a normal process in which your ability to get pregnant comes to an end. This condition causes hot flashes, night sweats, decreased interest in sex, mood swings, headaches, or lack of sleep. Treatment for this condition may include hormone replacement therapy. Take actions to keep yourself healthy, including exercising regularly, eating a healthy diet, watching your weight, and checking your blood pressure and blood sugar levels. Get screened for cancer and depression. Make sure that you are up to date with all your vaccines. This information is not intended to replace advice given to you by your health care provider. Make sure you discuss any questions you have with your healthcare provider. Document Revised: 03/19/2018 Document Reviewed: 03/19/2018 Elsevier Patient Education  2022 [employer].

## 2020-11-14 ENCOUNTER — Encounter: Payer: Self-pay | Admitting: Family Medicine

## 2020-11-14 DIAGNOSIS — I10 Essential (primary) hypertension: Secondary | ICD-10-CM

## 2020-11-16 MED ORDER — AMLODIPINE BESY-BENAZEPRIL HCL 5-10 MG PO CAPS: 1.0000 | ORAL_CAPSULE | Freq: Every day | ORAL | 6 refills | Status: DC

## 2020-11-16 NOTE — Telephone Encounter (Signed)
Plz call to schedule lab visit 1-2 wks after starting lotrel new BP med.

## 2020-12-16 MED ORDER — AMLODIPINE BESYLATE 5 MG PO TABS: 5.0000 mg | ORAL_TABLET | Freq: Every day | ORAL | 3 refills | Status: DC

## 2020-12-16 NOTE — Addendum Note (Signed)
Addended by: Ria Bush on: 12/16/2020 01:42 PM   Modules accepted: Orders

## 2021-01-01 ENCOUNTER — Other Ambulatory Visit: Payer: Self-pay | Admitting: Cardiology

## 2021-01-12 LAB — HM MAMMOGRAPHY

## 2021-01-16 ENCOUNTER — Encounter: Payer: Self-pay | Admitting: Family Medicine

## 2021-04-05 ENCOUNTER — Other Ambulatory Visit: Payer: Self-pay | Admitting: Cardiology

## 2021-10-14 ENCOUNTER — Other Ambulatory Visit: Payer: Self-pay | Admitting: Family Medicine

## 2021-10-14 DIAGNOSIS — E559 Vitamin D deficiency, unspecified: Secondary | ICD-10-CM

## 2021-10-14 DIAGNOSIS — I10 Essential (primary) hypertension: Secondary | ICD-10-CM

## 2021-10-14 DIAGNOSIS — E785 Hyperlipidemia, unspecified: Secondary | ICD-10-CM

## 2021-10-15 ENCOUNTER — Other Ambulatory Visit: Payer: Self-pay

## 2021-10-15 ENCOUNTER — Emergency Department
Admission: EM | Admit: 2021-10-15 | Discharge: 2021-10-15 | Disposition: A | Discharge status: Home / Self Care | Payer: 59 | Attending: Student in an Organized Health Care Education/Training Program | Admitting: Student in an Organized Health Care Education/Training Program

## 2021-10-15 DIAGNOSIS — S81811A Laceration without foreign body, right lower leg, initial encounter: Secondary | ICD-10-CM | POA: Diagnosis not present

## 2021-10-15 DIAGNOSIS — I10 Essential (primary) hypertension: Secondary | ICD-10-CM | POA: Insufficient documentation

## 2021-10-15 DIAGNOSIS — S61512A Laceration without foreign body of left wrist, initial encounter: Secondary | ICD-10-CM

## 2021-10-15 DIAGNOSIS — S6992XA Unspecified injury of left wrist, hand and finger(s), initial encounter: Secondary | ICD-10-CM | POA: Diagnosis present

## 2021-10-15 DIAGNOSIS — W010XXA Fall on same level from slipping, tripping and stumbling without subsequent striking against object, initial encounter: Secondary | ICD-10-CM | POA: Diagnosis not present

## 2021-10-15 DIAGNOSIS — R42 Dizziness and giddiness: Secondary | ICD-10-CM | POA: Insufficient documentation

## 2021-10-15 DIAGNOSIS — S81812A Laceration without foreign body, left lower leg, initial encounter: Secondary | ICD-10-CM

## 2021-10-15 DIAGNOSIS — Z23 Encounter for immunization: Secondary | ICD-10-CM | POA: Insufficient documentation

## 2021-10-15 MED ORDER — LIDOCAINE-EPINEPHRINE (PF) 2 %-1:200000 IJ SOLN
20.0000 mL | Freq: Once | INTRAMUSCULAR | Status: AC
  Administered 2021-10-15: 20 mL
  Filled 2021-10-15: qty 20

## 2021-10-15 MED ORDER — CEPHALEXIN 500 MG PO CAPS: 500.0000 mg | ORAL_CAPSULE | Freq: Four times a day (QID) | ORAL | 0 refills | Status: AC

## 2021-10-15 MED ORDER — TETANUS-DIPHTH-ACELL PERTUSSIS 5-2.5-18.5 LF-MCG/0.5 IM SUSY
0.5000 mL | PREFILLED_SYRINGE | Freq: Once | INTRAMUSCULAR | Status: AC
  Administered 2021-10-15: 0.5 mL via INTRAMUSCULAR
  Filled 2021-10-15: qty 0.5

## 2021-10-15 MED ORDER — OXYCODONE-ACETAMINOPHEN 5-325 MG PO TABS
1.0000 | ORAL_TABLET | Freq: Once | ORAL | Status: AC
  Administered 2021-10-15: 1 via ORAL
  Filled 2021-10-15: qty 1

## 2021-10-15 NOTE — ED Provider Notes (Signed)
[facility] Provider Note    Event Date/Time   First MD Initiated Contact with Patient 10/15/21 1530     (approximate)   History   Chief Complaint Laceration   HPI [name] is a 66 y.o. female, history of hyperlipidemia, hypertension, anxiety, malignant melanoma, presents to the emergency department for evaluation of multiple lacerations.  She was reportedly caring a box of bottles to her car when she tripped on the pavement, causing the glass bottles to burst and cut her left wrist and both lower extremities.  In triage, she states that she felt lightheaded and felt like she was going to pass out, however she states that she feels fine now.  Denies any head injury or LOC.  Denies blood thinner use.  Bleeding controlled with direct pressure.  Denies chest pain, shortness of breath, abdominal pain, flank pain, cold sensation in extremities, numbness/tingling in extremities, decreased range of motion, or dizziness/lightheadedness.  History Limitations: No limitations.        Physical Exam  Triage Vital Signs: ED Triage Vitals  Enc Vitals Group     BP 10/15/21 1526 94/70     Pulse Rate 10/15/21 1526 (!) 55     Resp 10/15/21 1526 18     Temp 10/15/21 1526 99.2 F (37.3 C)     Temp Source 10/15/21 1526 Oral     SpO2 10/15/21 1526 97 %     Weight 10/15/21 1526 165 lb (74.8 kg)     Height 10/15/21 1526 '5\' 5"'$  (1.651 m)     Head Circumference --      Peak Flow --      Pain Score 10/15/21 1525 5     Pain Loc --      Pain Edu? --      Excl. in [place]? --     Most recent vital signs: Vitals:   10/15/21 1526  BP: 94/70  Pulse: (!) 55  Resp: 18  Temp: 99.2 F (37.3 C)  SpO2: 97%    General: Awake, NAD.  Skin: Warm, dry. No rashes or lesions.  Eyes: PERRL. Conjunctivae normal.  CV: Good peripheral perfusion.  Resp: Normal effort.  Abd: Soft, non-tender. No distention.  Neuro: At baseline. No gross neurological deficits.   Focused Exam: 2 cm  horizontal linear laceration present along the anterior aspect of the left wrist.  No active bleeding or discharge.  No foreign bodies present.  No bony tenderness.  PMS intact distally.  4 cm L-shaped laceration present along the proximal tibial region of the left lower extremity.  No active bleeding or discharge.  No foreign bodies.  No bony tenderness.  PMS intact distally.  2 cm linear laceration along the distal femoral region in the right lower extremity.  No active bleeding or discharge.  No foreign bodies.  No bony tenderness.  PMS intact distally.  Physical Exam    ED Results / Procedures / Treatments  Labs (all labs ordered are listed, but only abnormal results are displayed) Labs Reviewed - No data to display   EKG N/A.   RADIOLOGY  ED Provider Interpretation: N/A.  No results found.  PROCEDURES:  Critical Care performed: N/A.  Marland Kitchen.Laceration Repair  Date/Time: 10/15/2021 4:55 PM  Performed by: Teodoro Spray, PA Authorized by: Teodoro Spray, PA   Consent:    Consent obtained:  Verbal   Consent given by:  Patient   Risks discussed:  Infection, pain, retained foreign body, need for additional  repair and poor cosmetic result Universal protocol:    Patient identity confirmed:  Verbally with patient Anesthesia:    Anesthesia method:  Local infiltration   Local anesthetic:  Lidocaine 2% WITH epi Laceration details:    Location:  Leg   Leg location:  L lower leg   Length (cm):  4   Depth (mm):  2 Pre-procedure details:    Preparation:  Patient was prepped and draped in usual sterile fashion Exploration:    Hemostasis achieved with:  Direct pressure   Wound exploration: wound explored through full range of motion and entire depth of wound visualized     Wound extent: no foreign bodies/material noted, no muscle damage noted, no underlying fracture noted and no vascular damage noted   Treatment:    Area cleansed with:  Saline   Amount of cleaning:   Extensive   Irrigation solution:  Sterile saline   Irrigation volume:  1000 ml   Irrigation method:  Pressure wash Skin repair:    Repair method:  Sutures   Suture size:  4-0   Suture material:  Prolene   Suture technique:  Simple interrupted   Number of sutures:  8 Approximation:    Approximation:  Close Repair type:    Repair type:  Simple Post-procedure details:    Dressing:  Adhesive bandage   Procedure completion:  Tolerated well, no immediate complications .Marland KitchenLaceration Repair  Date/Time: 10/15/2021 4:56 PM  Performed by: Teodoro Spray, PA Authorized by: Teodoro Spray, PA   Consent:    Consent obtained:  Verbal   Consent given by:  Patient   Risks discussed:  Infection, pain, retained foreign body, need for additional repair and poor cosmetic result Universal protocol:    Patient identity confirmed:  Verbally with patient Anesthesia:    Anesthesia method:  Local infiltration   Local anesthetic:  Lidocaine 2% WITH epi Laceration details:    Location:  Leg   Leg location:  R upper leg   Length (cm):  3   Depth (mm):  2 Pre-procedure details:    Preparation:  Patient was prepped and draped in usual sterile fashion Exploration:    Hemostasis achieved with:  Direct pressure   Wound extent: no fascia violation noted, no foreign bodies/material noted, no muscle damage noted, no tendon damage noted, no underlying fracture noted and no vascular damage noted   Treatment:    Amount of cleaning:  Extensive   Irrigation solution:  Sterile saline   Irrigation volume:  1000 ml   Irrigation method:  Pressure wash Skin repair:    Repair method:  Sutures   Suture size:  4-0   Suture material:  Prolene   Suture technique:  Running locked   Number of sutures:  6 Approximation:    Approximation:  Close Repair type:    Repair type:  Simple Post-procedure details:    Dressing:  Adhesive bandage   Procedure completion:  Tolerated well, no immediate  complications .Marland KitchenLaceration Repair  Date/Time: 10/15/2021 4:58 PM  Performed by: Teodoro Spray, PA Authorized by: Teodoro Spray, PA   Universal protocol:    Patient identity confirmed:  Verbally with patient Anesthesia:    Anesthesia method:  Local infiltration   Local anesthetic:  Lidocaine 2% WITH epi Laceration details:    Location:  Hand   Hand location:  L wrist   Length (cm):  3   Depth (mm):  3 Pre-procedure details:    Preparation:  Patient was prepped and  draped in usual sterile fashion Exploration:    Wound extent: no fascia violation noted, no foreign bodies/material noted, no muscle damage noted, no underlying fracture noted and no vascular damage noted   Treatment:    Amount of cleaning:  Extensive   Irrigation solution:  Sterile saline   Irrigation volume:  1000 ml Skin repair:    Repair method:  Sutures   Suture size:  4-0   Suture material:  Prolene   Suture technique:  Simple interrupted   Number of sutures:  6 Approximation:    Approximation:  Close Post-procedure details:    Dressing:  Open (no dressing)   Procedure completion:  Tolerated well, no immediate complications     MEDICATIONS ORDERED IN ED: Medications  lidocaine-EPINEPHrine (XYLOCAINE W/EPI) 2 %-1:200000 (PF) injection 20 mL (20 mLs Infiltration Given by Other 10/15/21 1548)  oxyCODONE-acetaminophen (PERCOCET/ROXICET) 5-325 MG per tablet 1 tablet (1 tablet Oral Given 10/15/21 1548)  Tdap (BOOSTRIX) injection 0.5 mL (0.5 mLs Intramuscular Given 10/15/21 1551)     IMPRESSION / MDM / ASSESSMENT AND PLAN / ED COURSE  I reviewed the triage vital signs and the nursing notes.                              Differential diagnosis includes, but is not limited to, laceration, foreign body, cellulitis.   ED Course Patient appears well, vitals initially showed hypotension with blood pressure of 94/70, though it has improved since arriving and is now 140/90.  Vital signs are otherwise  normal.  Current endorsing moderate pain.  We will go treat with oxycodone/acetaminophen.  Assessment/Plan Patient presents with 3 separate lacerations on wrist and lower extremities after mechanical fall earlier today.  No evidence of significant head/neck injury.  She denies any preceding symptoms.  Cleanse the wounds extensively and anesthetized with lidocaine with epi.  Repaired utilizing nonabsorbable 4-0 sutures.  See above for details.  Patient tolerated procedure well with no immediate complications.  Provided patient with tetanus booster.  We will plan to discharge with prescription for cephalexin.  Advised her to return here or go to urgent care for suture removal in 10 days.  Patient's presentation is most consistent with acute, uncomplicated illness.   Provided the patient with anticipatory guidance, return precautions, and educational material. Encouraged the patient to return to the emergency department at any time if they begin to experience any new or worsening symptoms. Patient expressed understanding and agreed with the plan.       FINAL CLINICAL IMPRESSION(S) / ED DIAGNOSES   Final diagnoses:  Wrist laceration, left, initial encounter  Leg laceration, left, initial encounter  Leg laceration, right, initial encounter     Rx / DC Orders   ED Discharge Orders          Ordered    cephALEXin (KEFLEX) 500 MG capsule  4 times daily        10/15/21 1701             Note:  This document was prepared using Dragon voice recognition software and may include unintentional dictation errors.   Teodoro Spray, [place] 10/15/21 1701    Merlyn Lot, MD 10/15/21 [phone]

## 2021-10-15 NOTE — ED Triage Notes (Signed)
Pt comes with c/o trip and fall. Pt has laceration with bleeding to left wrist and bilateral knees. Pt states no loc.

## 2021-10-15 NOTE — Discharge Instructions (Addendum)
-  You will need to have the sutures removed in 10 days.  He is can be removed at the emergency department, urgent care, or by your regular doctor.  -You may wash the affected areas with soap and water daily.  Keep covered with a Band-Aid or dressing throughout the day.  Recommend applying a over-the-counter topical antibiotic ointment as well.  -You may take Tylenol/ibuprofen as needed for pain.  Take all of your antibiotics as prescribed.  -Return to the emergency department anytime if you begin to experience any new or worsening symptoms.

## 2021-10-15 NOTE — ED Notes (Signed)
Pt refused wheelchair, pt denies dizziness, pt walked to lobby for dc.

## 2021-10-17 ENCOUNTER — Telehealth: Payer: Self-pay | Admitting: Family Medicine

## 2021-10-17 NOTE — Telephone Encounter (Signed)
Pt is returning Terri's call from the lab, please advise thank you.  Callback Number: [phone]

## 2021-10-19 ENCOUNTER — Other Ambulatory Visit (INDEPENDENT_AMBULATORY_CARE_PROVIDER_SITE_OTHER): Payer: 59

## 2021-10-19 DIAGNOSIS — E559 Vitamin D deficiency, unspecified: Secondary | ICD-10-CM

## 2021-10-19 DIAGNOSIS — E785 Hyperlipidemia, unspecified: Secondary | ICD-10-CM

## 2021-10-19 DIAGNOSIS — I10 Essential (primary) hypertension: Secondary | ICD-10-CM

## 2021-10-20 LAB — LIPID PANEL
Cholesterol: 184 mg/dL (ref <200)
HDL: 92 mg/dL (ref >=50)
LDL Cholesterol (Calc): 79 mg/dL (calc)
Non-HDL Cholesterol (Calc): 92 mg/dL (calc) (ref <130)
Total CHOL/HDL Ratio: 2 (calc) (ref <5.0)
Triglycerides: 54 mg/dL (ref <150)

## 2021-10-20 LAB — COMPREHENSIVE METABOLIC PANEL
AG Ratio: 2 (calc) (ref 1.0–2.5)
ALT: 14 U/L (ref 6–29)
AST: 15 U/L (ref 10–35)
Albumin: 4.2 g/dL (ref 3.6–5.1)
Alkaline phosphatase (APISO): 73 U/L (ref 37–153)
BUN: 15 mg/dL (ref 7–25)
CO2: 27 mmol/L (ref 20–32)
Calcium: 9 mg/dL (ref 8.6–10.4)
Chloride: 103 mmol/L (ref 98–110)
Creat: 0.75 mg/dL (ref 0.50–1.05)
Globulin: 2.1 g/dL (calc) (ref 1.9–3.7)
Glucose, Bld: 84 mg/dL (ref 65–99)
Potassium: 4.3 mmol/L (ref 3.5–5.3)
Sodium: 138 mmol/L (ref 135–146)
Total Bilirubin: 0.6 mg/dL (ref 0.2–1.2)
Total Protein: 6.3 g/dL (ref 6.1–8.1)

## 2021-10-20 LAB — MICROALBUMIN / CREATININE URINE RATIO
Creatinine, Urine: 22 mg/dL (ref 20–275)
Microalb, Ur: <0.2 mg/dL

## 2021-10-20 LAB — VITAMIN D 25 HYDROXY (VIT D DEFICIENCY, FRACTURES): Vit D, 25-Hydroxy: 38 ng/mL (ref 30–100)

## 2021-10-23 ENCOUNTER — Encounter: Payer: Self-pay | Admitting: Family Medicine

## 2021-10-23 ENCOUNTER — Ambulatory Visit (INDEPENDENT_AMBULATORY_CARE_PROVIDER_SITE_OTHER): Payer: 59 | Admitting: Family Medicine

## 2021-10-23 VITALS — BP 130/80 | HR 75 | Temp 98.2°F | Ht 64.5 in | Wt 165.0 lb

## 2021-10-23 DIAGNOSIS — I2510 Atherosclerotic heart disease of native coronary artery without angina pectoris: Secondary | ICD-10-CM | POA: Diagnosis not present

## 2021-10-23 DIAGNOSIS — S61512A Laceration without foreign body of left wrist, initial encounter: Secondary | ICD-10-CM

## 2021-10-23 DIAGNOSIS — Z0000 Encounter for general adult medical examination without abnormal findings: Secondary | ICD-10-CM

## 2021-10-23 DIAGNOSIS — H9319 Tinnitus, unspecified ear: Secondary | ICD-10-CM | POA: Insufficient documentation

## 2021-10-23 DIAGNOSIS — F5104 Psychophysiologic insomnia: Secondary | ICD-10-CM

## 2021-10-23 DIAGNOSIS — E559 Vitamin D deficiency, unspecified: Secondary | ICD-10-CM

## 2021-10-23 DIAGNOSIS — E785 Hyperlipidemia, unspecified: Secondary | ICD-10-CM | POA: Diagnosis not present

## 2021-10-23 DIAGNOSIS — I10 Essential (primary) hypertension: Secondary | ICD-10-CM

## 2021-10-23 DIAGNOSIS — H9313 Tinnitus, bilateral: Secondary | ICD-10-CM

## 2021-10-23 MED ORDER — AMLODIPINE BESYLATE 5 MG PO TABS: 5.0000 mg | ORAL_TABLET | Freq: Every day | ORAL | 3 refills | Status: DC

## 2021-10-23 NOTE — Progress Notes (Addendum)
Patient ID: [name], female    DOB: 26-Feb-1956, 66 y.o.   MRN: [id number]  This visit was conducted in person.  BP 130/80   Pulse 75   Temp 98.2 F (36.8 C) (Temporal)   Ht 5' 4.5" (1.638 m)   Wt 165 lb (74.8 kg)   LMP 12/15/2011   SpO2 98%   BMI 27.88 kg/m    CC: CPE Subjective:   HPI: [name] is a 66 y.o. female presenting on 10/23/2021 for Annual Exam   Recent ER visit 10/15/2021 for wrist and leg lacerations sustained after falling onto broken bottles (8 sutures to L lower leg, 6 to R upper leg, 6 sutures to L wrist). Tdap updated 10/15/2021.   Notes bad tinnitus L>R, over last 8 months. No new medications or supplements or NSAID use. High pitched ring, not pulsatile.  She did recently start hormone therapy through [facility]. Bad hot flashes and weight gain.   ?Microvascular ischemia CAD followed by Dr [name]. Strong fmhx CAD. On pravastatin '40mg'$  daily. reassuring coronary calcium score of zero. Heart palpitations not captured on monitor - considering Kardia monitor, PRN propranolol '10mg'$ .   Preventative: COLONOSCOPY Date: 2008 small hyperplastic polyps, small int hem, early diverticula ([place]) rpt 10 yrs. iFOB through work normal 06/2021.  Mammogram - 01/2021 Birdas1 @ [facility] woman with OBGYN Dr. Josefa Half - 08/2017, due for f/u may need pap this year. Ongoing hot flashes, just restarted HRT. LMP ~2014  Lung cancer screening - not eligible Flu shot yearly COVID vaccine - [place] 04/2019, 05/2019, booster 01/2020 , bivalent 12/2020 Tdap - 2012, 10/2021 Shingrix - 01/2018, 05/2018  Seat belt [place] discussed. Sunscreen use discussed. H/o melanoma, followed regularly by derm yearly (Dr Amy Martinique at [facility] derm)  Sleep - very intermittent. Sleep maintenance insomnia. Good sleep hygiene.  Non smoker Alcohol - 3-4 glasses on weekends, none on weekdays  Dentist q6 mo Eye exam yearly   Caffeine: 2 cups coffee/day   Widower [name] 06/2018) 2 dogs.    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 Interim medical history since our last visit reviewed. Allergies and medications reviewed and updated. Outpatient Medications Prior to Visit  Medication Sig Dispense Refill   ALPRAZolam (XANAX) 0.5 MG tablet Take 0.5 tablets (0.25 mg total) by mouth at bedtime as needed for sleep. 30 tablet 1   Cholecalciferol (VITAMIN D3 PO) Take 1 capsule by mouth daily. Takes 5,000 IU daily     Coenzyme Q10 ([employer] COQ-10) 200 MG capsule Take 1 capsule (200 mg total) by mouth daily.     Cyanocobalamin (VITAMIN B-12) 5000 MCG SUBL Place 1 tablet under the tongue daily.     cycloSPORINE (RESTASIS) 0.05 % ophthalmic emulsion Place 1 drop into both eyes 2 (two) times daily.     diphenhydrAMINE (BENADRYL ALLERGY) 25 MG tablet Take 0.5 tablets (12.5 mg total) by mouth at bedtime as needed for sleep.     Melatonin 3 MG TABS Take 1 tablet by mouth at bedtime. Spray formulation     MISC NATURAL PRODUCTS IJ Inject as directed. BHRT pellets every 3 months     NP THYROID 60 MG tablet Take 60 mg by mouth every morning.     pravastatin (PRAVACHOL) 40 MG tablet Take  1 tablet (40 mg total) by mouth daily. Need Appt 90 tablet 2   Probiotic Product (PROBIOTIC PO) Take by mouth daily.     progesterone (PROMETRIUM) 100 MG capsule Take 100 mg by mouth at bedtime.     propranolol (INDERAL) 10 MG tablet Take 1 tablet (10 mg total) by mouth daily. (Patient taking differently: Take 10 mg by mouth daily. As needed) 10 tablet 11   Turmeric (QC TUMERIC COMPLEX PO) Take 1,000 mg by mouth.     vitamin C (ASCORBIC ACID) 500 MG tablet Take 500 mg by mouth daily. Takes 2 tablets daily     Zinc 50 MG CAPS Take 1 capsule (50 mg total) by mouth daily.  0   amLODipine (NORVASC) 5 MG tablet Take 1  tablet (5 mg total) by mouth daily. 90 tablet 3   XIIDRA 5 % SOLN Place 1 drop into both eyes daily.  11   No facility-administered medications prior to visit.     Per HPI unless specifically indicated in ROS section below Review of Systems  Constitutional:  Negative for activity change, appetite change, chills, fatigue, fever and unexpected weight change.  HENT:  Negative for hearing loss.   Eyes:  Negative for visual disturbance.  Respiratory:  Negative for cough, chest tightness, shortness of breath and wheezing.   Cardiovascular:  Negative for chest pain, palpitations and leg swelling.  Gastrointestinal:  Negative for abdominal distention, abdominal pain, blood in stool, constipation, diarrhea, nausea and vomiting.  Genitourinary:  Negative for difficulty urinating and hematuria.  Musculoskeletal:  Negative for arthralgias, myalgias and neck pain.  Skin:  Negative for rash.  Neurological:  Negative for dizziness, seizures, syncope and headaches.  Hematological:  Negative for adenopathy. Does not bruise/bleed easily.  Psychiatric/Behavioral:  Negative for dysphoric mood. The patient is not nervous/anxious.     Objective:  BP 130/80   Pulse 75   Temp 98.2 F (36.8 C) (Temporal)   Ht 5' 4.5" (1.638 m)   Wt 165 lb (74.8 kg)   LMP 12/15/2011   SpO2 98%   BMI 27.88 kg/m   Wt Readings from Last 3 Encounters:  10/25/21 167 lb (75.8 kg)  10/23/21 165 lb (74.8 kg)  10/15/21 165 lb (74.8 kg)      Physical Exam Vitals and nursing note reviewed.  Constitutional:      Appearance: Normal appearance. She is not ill-appearing.  HENT:     Head: Normocephalic and atraumatic.     Right Ear: Tympanic membrane, ear canal and external ear normal. There is no impacted cerumen.     Left Ear: Tympanic membrane, ear canal and external ear normal. There is no impacted cerumen.  Eyes:     General:        Right eye: No discharge.        Left eye: No discharge.     Extraocular Movements:  Extraocular movements intact.     Conjunctiva/sclera: Conjunctivae normal.     Pupils: Pupils are equal, round, and reactive to light.  Neck:     Thyroid: No thyroid mass or thyromegaly.  Cardiovascular:     Rate and Rhythm: Normal rate and regular rhythm.     Pulses: Normal pulses.     Heart sounds: Normal heart sounds. No murmur heard. Pulmonary:     Effort: Pulmonary effort is normal. No respiratory distress.     Breath sounds: Normal breath sounds. No wheezing, rhonchi or rales.  Abdominal:     General: Bowel sounds are  normal. There is no distension.     Palpations: Abdomen is soft. There is no mass.     Tenderness: There is no abdominal tenderness. There is no guarding or rebound.     Hernia: No hernia is present.  Musculoskeletal:     Cervical back: Normal range of motion and neck supple. No rigidity.     Right lower leg: No edema.     Left lower leg: No edema.  Lymphadenopathy:     Cervical: No cervical adenopathy.  Skin:    General: Skin is warm and dry.     Findings: No rash.  Neurological:     General: No focal deficit present.     Mental Status: She is alert. Mental status is at baseline.  Psychiatric:        Mood and Affect: Mood normal.        Behavior: Behavior normal.   Suture removal: 6 simple interrupted stitches removed from L ventral wrist, pt tolerated well. Poor approximation of skin noted without erythema or drainage. Steri strips applied.      Results for orders placed or performed in visit on 10/19/21  Lipid panel  Result Value Ref Range   Cholesterol 184 <200 mg/dL   HDL 92 > OR = 50 mg/dL   Triglycerides 54 <150 mg/dL   LDL Cholesterol (Calc) 79 mg/dL (calc)   Total CHOL/HDL Ratio 2.0 <5.0 (calc)   Non-HDL Cholesterol (Calc) 92 <130 mg/dL (calc)  Comprehensive metabolic panel  Result Value Ref Range   Glucose, Bld 84 65 - 99 mg/dL   BUN 15 7 - 25 mg/dL   Creat 0.75 0.50 - 1.05 mg/dL   BUN/Creatinine Ratio NOT APPLICABLE 6 - 22 (calc)    Sodium 138 135 - 146 mmol/L   Potassium 4.3 3.5 - 5.3 mmol/L   Chloride 103 98 - 110 mmol/L   CO2 27 20 - 32 mmol/L   Calcium 9.0 8.6 - 10.4 mg/dL   Total Protein 6.3 6.1 - 8.1 g/dL   Albumin 4.2 3.6 - 5.1 g/dL   Globulin 2.1 1.9 - 3.7 g/dL (calc)   AG Ratio 2.0 1.0 - 2.5 (calc)   Total Bilirubin 0.6 0.2 - 1.2 mg/dL   Alkaline phosphatase (APISO) 73 37 - 153 U/L   AST 15 10 - 35 U/L   ALT 14 6 - 29 U/L  Microalbumin / creatinine urine ratio  Result Value Ref Range   Creatinine, Urine 22 20 - 275 mg/dL   Microalb, Ur <0.2 mg/dL   Microalb Creat Ratio NOTE <30 mcg/mg creat  VITAMIN D 25 Hydroxy (Vit-D Deficiency, Fractures)  Result Value Ref Range   Vit D, 25-Hydroxy 38 30 - 100 ng/mL    Assessment & Plan:   Problem List Items Addressed This Visit     Hyperlipidemia with target LDL less than 100 (Chronic)    Chronic, continues pravastatin with good effect. The 10-year ASCVD risk score (Arnett DK, et al., 2019) is: 5.2%   Values used to calculate the score:     Age: 14 years     Sex: Female     Is Non-Hispanic African American: No     Diabetic: No     Tobacco smoker: No     Systolic Blood Pressure: 161 mmHg     Is BP treated: Yes     HDL Cholesterol: 92 mg/dL     Total Cholesterol: 184 mg/dL       Relevant Medications   amLODipine (NORVASC) 5  MG tablet   Health maintenance examination - Primary (Chronic)    Preventative protocols reviewed and updated unless pt declined. Discussed healthy diet and lifestyle.  Advised to call and schedule well woman exam as due.       Microvascular coronary disease (Chronic)   Relevant Medications   amLODipine (NORVASC) 5 MG tablet   Chronic insomnia    Longstanding sleep maintenance insomnia, despite good sleep hygiene.  Consider doxepin '10mg'$  - silenor not covered.       Vitamin D deficiency    Continue 5000 IU daily replacement.       Essential hypertension    Chronic, stable on amlodipine - continue this.       Relevant  Medications   amLODipine (NORVASC) 5 MG tablet   Tinnitus    Ongoing for months, L>R.  Ear exam overall ok. Will need hearing screened at next OV.       Wrist laceration, left, initial encounter    6 stitches removed from dorsal L wrist, steri strips applied. pt tolerated well. After care wound instructions provided.         Meds ordered this encounter  Medications   amLODipine (NORVASC) 5 MG tablet    Sig: Take 1 tablet (5 mg total) by mouth daily.    Dispense:  90 tablet    Refill:  3   No orders of the defined types were placed in this encounter.   Patient instructions: Call and schedule appointment with Dr Josefa Half.  Good to see you today Continue amlodipine. Continue vitamin D.  Cholesterol levels are doing well! Return as needed or in 1 year for next physical.   Follow up plan: Return in about 1 year (around 10/24/2022) for annual exam, prior fasting for blood work.  Ria Bush, MD

## 2021-10-23 NOTE — Assessment & Plan Note (Signed)
Longstanding sleep maintenance insomnia, despite good sleep hygiene.  Consider doxepin '10mg'$  - silenor not covered.

## 2021-10-23 NOTE — Assessment & Plan Note (Signed)
Chronic, stable on amlodipine-continue this. 

## 2021-10-23 NOTE — Patient Instructions (Addendum)
Call and schedule appointment with Dr Josefa Half.  Good to see you today Continue amlodipine. Continue vitamin D.  Cholesterol levels are doing well! Return as needed or in 1 year for next physical.   Health Maintenance for Postmenopausal Women Menopause is a normal process in which your ability to get pregnant comes to an end. This process happens slowly over many months or years, usually between the ages of 65 and 40. Menopause is complete when you have missed your menstrual period for 12 months. It is important to talk with your health care provider about some of the most common conditions that affect women after menopause (postmenopausal women). These include heart disease, cancer, and bone loss (osteoporosis). Adopting a healthy lifestyle and getting preventive care can help to promote your health and wellness. The actions you take can also lower your chances of developing some of these common conditions. What are the signs and symptoms of menopause? During menopause, you may have the following symptoms: Hot flashes. These can be moderate or severe. Night sweats. Decrease in sex drive. Mood swings. Headaches. Tiredness (fatigue). Irritability. Memory problems. Problems falling asleep or staying asleep. Talk with your health care provider about treatment options for your symptoms. Do I need hormone replacement therapy? Hormone replacement therapy is effective in treating symptoms that are caused by menopause, such as hot flashes and night sweats. Hormone replacement carries certain risks, especially as you become older. If you are thinking about using estrogen or estrogen with progestin, discuss the benefits and risks with your health care provider. How can I reduce my risk for heart disease and stroke? The risk of heart disease, heart attack, and stroke increases as you age. One of the causes may be a change in the body's hormones during menopause. This can affect how your body uses  dietary fats, triglycerides, and cholesterol. Heart attack and stroke are medical emergencies. There are many things that you can do to help prevent heart disease and stroke. Watch your blood pressure High blood pressure causes heart disease and increases the risk of stroke. This is more likely to develop in people who have high blood pressure readings or are overweight. Have your blood pressure checked: Every 3-5 years if you are 65-53 years of age. Every year if you are 64 years old or older. Eat a healthy diet  Eat a diet that includes plenty of vegetables, fruits, low-fat dairy products, and lean protein. Do not eat a lot of foods that are high in solid fats, added sugars, or sodium. Get regular exercise Get regular exercise. This is one of the most important things you can do for your health. Most adults should: Try to exercise for at least 150 minutes each week. The exercise should increase your heart rate and make you sweat (moderate-intensity exercise). Try to do strengthening exercises at least twice each week. Do these in addition to the moderate-intensity exercise. Spend less time sitting. Even light physical activity can be beneficial. Other tips Work with your health care provider to achieve or maintain a healthy weight. Do not use any products that contain nicotine or tobacco. These products include cigarettes, chewing tobacco, and vaping devices, such as e-cigarettes. If you need help quitting, ask your health care provider. Know your numbers. Ask your health care provider to check your cholesterol and your blood sugar (glucose). Continue to have your blood tested as directed by your health care provider. Do I need screening for cancer? Depending on your health history and family history,  you may need to have cancer screenings at different stages of your life. This may include screening for: Breast cancer. Cervical cancer. Lung cancer. Colorectal cancer. What is my risk for  osteoporosis? After menopause, you may be at increased risk for osteoporosis. Osteoporosis is a condition in which bone destruction happens more quickly than new bone creation. To help prevent osteoporosis or the bone fractures that can happen because of osteoporosis, you may take the following actions: If you are 65-24 years old, get at least 1,000 mg of calcium and at least 600 international units (IU) of vitamin D per day. If you are older than age 30 but younger than age 65, get at least 1,200 mg of calcium and at least 600 international units (IU) of vitamin D per day. If you are older than age 65, get at least 1,200 mg of calcium and at least 800 international units (IU) of vitamin D per day. Smoking and drinking excessive alcohol increase the risk of osteoporosis. Eat foods that are rich in calcium and vitamin D, and do weight-bearing exercises several times each week as directed by your health care provider. How does menopause affect my mental health? Depression may occur at any age, but it is more common as you become older. Common symptoms of depression include: Feeling depressed. Changes in sleep patterns. Changes in appetite or eating patterns. Feeling an overall lack of motivation or enjoyment of activities that you previously enjoyed. Frequent crying spells. Talk with your health care provider if you think that you are experiencing any of these symptoms. General instructions See your health care provider for regular wellness exams and vaccines. This may include: Scheduling regular health, dental, and eye exams. Getting and maintaining your vaccines. These include: Influenza vaccine. Get this vaccine each year before the flu season begins. Pneumonia vaccine. Shingles vaccine. Tetanus, diphtheria, and pertussis (Tdap) booster vaccine. Your health care provider may also recommend other immunizations. Tell your health care provider if you have ever been abused or do not feel safe at  home. Summary Menopause is a normal process in which your ability to get pregnant comes to an end. This condition causes hot flashes, night sweats, decreased interest in sex, mood swings, headaches, or lack of sleep. Treatment for this condition may include hormone replacement therapy. Take actions to keep yourself healthy, including exercising regularly, eating a healthy diet, watching your weight, and checking your blood pressure and blood sugar levels. Get screened for cancer and depression. Make sure that you are up to date with all your vaccines. This information is not intended to replace advice given to you by your health care provider. Make sure you discuss any questions you have with your health care provider. Document Revised: 08/15/2020 Document Reviewed: 08/15/2020 Elsevier Patient Education  [place].

## 2021-10-23 NOTE — Assessment & Plan Note (Signed)
Ongoing for months, L>R.  Ear exam overall ok. Will need hearing screened at next OV.

## 2021-10-23 NOTE — Assessment & Plan Note (Signed)
Chronic, continues pravastatin with good effect. The 10-year ASCVD risk score (Arnett DK, et al., 2019) is: 5.2%   Values used to calculate the score:     Age: 66 years     Sex: Female     Is Non-Hispanic African American: No     Diabetic: No     Tobacco smoker: No     Systolic Blood Pressure: 307 mmHg     Is BP treated: Yes     HDL Cholesterol: 92 mg/dL     Total Cholesterol: 184 mg/dL

## 2021-10-23 NOTE — Assessment & Plan Note (Addendum)
Preventative protocols reviewed and updated unless pt declined. Discussed healthy diet and lifestyle.  Advised to call and schedule well woman exam as due.

## 2021-10-23 NOTE — Assessment & Plan Note (Signed)
Continue 5000 IU daily replacement.

## 2021-10-25 ENCOUNTER — Ambulatory Visit (INDEPENDENT_AMBULATORY_CARE_PROVIDER_SITE_OTHER): Payer: 59 | Admitting: Family Medicine

## 2021-10-25 ENCOUNTER — Encounter: Payer: Self-pay | Admitting: Family Medicine

## 2021-10-25 VITALS — BP 126/82 | HR 79 | Temp 98.2°F | Ht 64.5 in | Wt 167.0 lb

## 2021-10-25 DIAGNOSIS — S81819D Laceration without foreign body, unspecified lower leg, subsequent encounter: Secondary | ICD-10-CM

## 2021-10-25 DIAGNOSIS — H9313 Tinnitus, bilateral: Secondary | ICD-10-CM | POA: Diagnosis not present

## 2021-10-25 NOTE — Progress Notes (Signed)
Patient ID: [name], female    DOB: 16-Sep-1955, 66 y.o.   MRN: [id number]  This visit was conducted in person.  BP 126/82   Pulse 79   Temp 98.2 F (36.8 C) (Temporal)   Ht 5' 4.5" (1.638 m)   Wt 167 lb (75.8 kg)   LMP 12/15/2011   SpO2 98%   BMI 28.22 kg/m    Hearing Screening   '500Hz'$  '1000Hz'$  '2000Hz'$  '4000Hz'$   Right ear 40 40 20 25  Left ear '25 25 25 '$ 0    CC: suture removal Subjective:   HPI: [name] is a 66 y.o. female presenting on 10/25/2021 for Suture / Staple Removal (Has 7 sutures in anterior R knee and 8 sutures in anterior L lower leg to be removed. )   See prior note for details.   Recent ER visit 10/15/2021 for wrist and leg lacerations sustained after falling onto broken bottles (8 sutures to L lower leg, 6 to R upper leg, 6 sutures to L wrist). Tdap updated 10/15/2021  Wrist sutures removed on Monday, steri strips applied but she has since removed them.  Mild erythema around leg lacerations likely from suture irritation, no induration or drainage.       Relevant past medical, surgical, family and social history reviewed and updated as indicated. Interim medical history since our last visit reviewed. Allergies and medications reviewed and updated. Outpatient Medications Prior to Visit  Medication Sig Dispense Refill   ALPRAZolam (XANAX) 0.5 MG tablet Take 0.5 tablets (0.25 mg total) by mouth at bedtime as needed for sleep. 30 tablet 1   amLODipine (NORVASC) 5 MG tablet Take 1 tablet (5 mg total) by mouth daily. 90 tablet 3   Cholecalciferol (VITAMIN D3 PO) Take 1 capsule by mouth daily. Takes 5,000 IU daily     Coenzyme Q10 ([employer] COQ-10) 200 MG capsule Take 1 capsule (200 mg total) by mouth daily.     Cyanocobalamin (VITAMIN B-12) 5000 MCG SUBL Place 1 tablet under the tongue daily.     cycloSPORINE (RESTASIS) 0.05 % ophthalmic emulsion Place 1 drop into both eyes 2 (two) times daily.     diphenhydrAMINE (BENADRYL ALLERGY) 25 MG tablet Take 0.5  tablets (12.5 mg total) by mouth at bedtime as needed for sleep.     Melatonin 3 MG TABS Take 1 tablet by mouth at bedtime. Spray formulation     MISC NATURAL PRODUCTS IJ Inject as directed. BHRT pellets every 3 months     NP THYROID 60 MG tablet Take 60 mg by mouth every morning.     pravastatin (PRAVACHOL) 40 MG tablet Take 1 tablet (40 mg total) by mouth daily. Need Appt 90 tablet 2   Probiotic Product (PROBIOTIC PO) Take by mouth daily.     progesterone (PROMETRIUM) 100 MG capsule Take 100 mg by mouth at bedtime.     propranolol (INDERAL) 10 MG tablet Take 1 tablet (10 mg total) by mouth daily. (Patient taking differently: Take 10 mg by mouth daily. As needed) 10 tablet 11   Turmeric (QC TUMERIC COMPLEX PO) Take 1,000 mg by mouth.     vitamin C (ASCORBIC ACID) 500 MG tablet Take 500 mg by mouth daily. Takes 2 tablets daily     Zinc 50 MG CAPS Take 1 capsule (50 mg total) by mouth daily.  0   No facility-administered medications prior to visit.     Per HPI unless specifically indicated in ROS section below Review of Systems  Objective:  BP 126/82   Pulse 79   Temp 98.2 F (36.8 C) (Temporal)   Ht 5' 4.5" (1.638 m)   Wt 167 lb (75.8 kg)   LMP 12/15/2011   SpO2 98%   BMI 28.22 kg/m   Wt Readings from Last 3 Encounters:  10/25/21 167 lb (75.8 kg)  10/23/21 165 lb (74.8 kg)  10/15/21 165 lb (74.8 kg)      Physical Exam Vitals and nursing note reviewed.  Constitutional:      Appearance: Normal appearance. She is not ill-appearing.  Skin:    General: Skin is warm and dry.     Findings: No rash.  Neurological:     Mental Status: She is alert.  Psychiatric:        Mood and Affect: Mood normal.        Behavior: Behavior normal.   Suture removal:  Skin cleaned with alcohol had.  8 simple interrupted sutures removed from left leg wound, continuous suture removed from right leg wound in usual fashion. Incomplete approximation of skin on left leg, good approximation of skin on  right leg wound. Both wounds cleaned/dressed with compound benzoin tincture and steri strips applied. Discussed home wound care.       Assessment & Plan:   Problem List Items Addressed This Visit     Tinnitus    Chronic tinnitus L>R.  Hearing screen: L ear hearing WNL, R ear needing increased volume to hear tone.  Will continue to monitor.       Lacerations of multiple sites of leg - Primary    Bilateral leg laceration sutures removed, steri stips applied, pt tolerated well. Home care wound instructions provided. Red flags to seek further care reviewed.         No orders of the defined types were placed in this encounter.  No orders of the defined types were placed in this encounter.    Patient Instructions  Hearing screen today.  Final sutures removed.   Follow up plan: No follow-ups on file.  Ria Bush, MD

## 2021-10-25 NOTE — Patient Instructions (Signed)
Hearing screen today.  Final sutures removed.

## 2021-10-27 ENCOUNTER — Ambulatory Visit: Payer: 59 | Admitting: Family Medicine

## 2021-10-27 ENCOUNTER — Encounter: Payer: Self-pay | Admitting: Family Medicine

## 2021-10-27 DIAGNOSIS — S61512A Laceration without foreign body of left wrist, initial encounter: Secondary | ICD-10-CM | POA: Insufficient documentation

## 2021-10-27 DIAGNOSIS — S81819A Laceration without foreign body, unspecified lower leg, initial encounter: Secondary | ICD-10-CM | POA: Insufficient documentation

## 2021-10-27 NOTE — Assessment & Plan Note (Addendum)
Chronic tinnitus L>R.  Hearing screen: L ear hearing WNL, R ear needing increased volume to hear tone.  Will continue to monitor.

## 2021-10-27 NOTE — Assessment & Plan Note (Signed)
6 stitches removed from dorsal L wrist, steri strips applied. pt tolerated well. After care wound instructions provided.

## 2021-10-27 NOTE — Assessment & Plan Note (Signed)
Bilateral leg laceration sutures removed, steri stips applied, pt tolerated well. Home care wound instructions provided. Red flags to seek further care reviewed.

## 2021-12-04 ENCOUNTER — Ambulatory Visit: Payer: 59 | Admitting: Cardiology

## 2021-12-04 NOTE — Progress Notes (Unsigned)
ekg 

## 2021-12-07 NOTE — Progress Notes (Signed)
This encounter was created in error - please disregard.

## 2022-01-04 ENCOUNTER — Other Ambulatory Visit: Payer: Self-pay | Admitting: Cardiology

## 2022-01-18 LAB — HM MAMMOGRAPHY

## 2022-01-24 ENCOUNTER — Encounter: Payer: Self-pay | Admitting: Family Medicine

## 2022-01-26 ENCOUNTER — Other Ambulatory Visit: Payer: Self-pay | Admitting: Cardiology

## 2022-02-24 ENCOUNTER — Other Ambulatory Visit: Payer: Self-pay | Admitting: Cardiology

## 2022-04-17 ENCOUNTER — Encounter: Payer: Self-pay | Admitting: Cardiology

## 2022-04-17 ENCOUNTER — Ambulatory Visit: Payer: PPO | Attending: Cardiology | Admitting: Cardiology

## 2022-04-17 VITALS — BP 138/80 | HR 64 | Ht 65.0 in | Wt 153.6 lb

## 2022-04-17 DIAGNOSIS — E785 Hyperlipidemia, unspecified: Secondary | ICD-10-CM

## 2022-04-17 DIAGNOSIS — R002 Palpitations: Secondary | ICD-10-CM | POA: Diagnosis not present

## 2022-04-17 DIAGNOSIS — I10 Essential (primary) hypertension: Secondary | ICD-10-CM

## 2022-04-17 DIAGNOSIS — I2510 Atherosclerotic heart disease of native coronary artery without angina pectoris: Secondary | ICD-10-CM | POA: Diagnosis not present

## 2022-04-17 DIAGNOSIS — Z8249 Family history of ischemic heart disease and other diseases of the circulatory system: Secondary | ICD-10-CM

## 2022-04-17 MED ORDER — PROPRANOLOL HCL 10 MG PO TABS: 10.0000 mg | ORAL_TABLET | Freq: Every day | ORAL | 11 refills | Status: DC

## 2022-04-17 NOTE — Patient Instructions (Signed)
Medication Instructions:    Try to use propranolol 10 mg  at bedtime daily or as needed  *If you need a refill on your cardiac medications before your next appointment, please call your pharmacy*   Lab Work:  Not needed   Testing/Procedures: Not needed   Follow-Up: At [facility], you and your health needs are our priority.  As part of our continuing mission to provide you with exceptional heart care, we have created designated Provider Care Teams.  These Care Teams include your primary Cardiologist (physician) and Advanced Practice Providers (APPs -  Physician Assistants and Nurse Practitioners) who all work together to provide you with the care you need, when you need it.     Your next appointment:   1 year(s)  The format for your next appointment:   In Person  Provider:   Glenetta Hew, MD
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
## 2022-04-18 NOTE — Progress Notes (Unsigned)
67 y.o. G30P0103 Widowed Caucasian female here for NEW GYN/annual exam.    Has insomnia and tinnitus.   Patient is using testosterone and estradiol pellet implants and taking progesterone supplement through [employer] in [place].  Hot flashes are controlled.  She has brought in a copy of her lab studies.  Total testosterone 188 and free testosterone 12.1 on 03/20/22.  She does labs every 3 months.   Husband passed away with heart disease in March, 2020.  New partner for 2 years.  She declines STD screening.   PCP:  Dr. Danise Mina.   Patient's last menstrual period was 12/15/2011.           Sexually active: Yes.    The current method of family planning is post menopausal status/BTL.    Exercising: Yes.     Walking, joining a gym soon Smoker:  former  Health Maintenance: Pap:  08/03/15 Neg:Neg HR HPV  History of abnormal Pap:  yes, in the 80's MMG:  01/18/22 Breast Density Category B, BI-RADS Category 1 Neg Colonoscopy:  09/28/06 - Dr. Collene Mares BMD:   n/a  Result  n/a TDaP:  10/15/21 Gardasil:   no HIV: 08/03/15 neg Hep C: 08/03/15 neg Screening Labs:  PCP and [facility].   reports that she quit smoking about 30 years ago. Her smoking use included cigarettes. She has never used smokeless tobacco. She reports current alcohol use of about 6.0 standard drinks of alcohol per week. She reports that she does not use drugs.  Past Medical History:  Diagnosis Date   Anxiety    CAD (coronary artery disease)    CPET Test suggests mild potential ischemic findings - likely Microvascular; Normal LV function on Echo, Overall Good score of CPET   Diverticulosis    by colonosocpy   Heart murmur    PVCs thought from MVP -- reportedly had echocardiogram in 2005 at [facility]; follow-up Echo February 2015: Normal LV size and function. EF 60-65% with no RWMA, no aortic stenosis, trivial MR with no suggestion of prolapse,   History of chicken pox    History of colon  polyps    HLD (hyperlipidemia)    mild   Malignant melanoma ([place]) 1988   L thigh, stage 3, yearly skin checks with Dr .Martinique at [place] D deficiency     Past Surgical History:  Procedure Laterality Date   CARDIOVASCULAR STRESS TEST  08/2014   ETT - low risk, good exercise tolerance [name])   CARDIOVASCULAR STRESS TEST (CPET)  2015   Low Risk Study, but evidence of early Myocardial Dysfunction   CESAREAN SECTION  1982 and 1989   COLONOSCOPY  2008   small hyperplastic polyps, small int hem, early diverticula Collene Mares) rpt 10 yrs   DILATATION & CURRETTAGE/HYSTEROSCOPY WITH RESECTOCOPE N/A 06/02/2013   Procedure: DILATATION & CURETTAGE/HYSTEROSCOPY WITH RESECTOCOPE;  Surgeon: Jamey Reas de Berton Lan, MD;  Location: [facility];  Service: Gynecology;  Laterality: N/A;   DILATION AND CURETTAGE OF [place]   SKIN SURGERY  1988   melanoma excision   TUBAL LIGATION  1998   US ECHOCARDIOGRAPHY  05/2013   WNL, likely microvascular disease    Current Outpatient Medications  Medication Sig Dispense Refill   ALPRAZolam (XANAX) 0.5 MG tablet Take 0.5 tablets (0.25 mg total) by mouth at bedtime as needed for sleep. 30 tablet 1   amLODipine (NORVASC) 5 MG tablet Take 1 tablet (5  mg total) by mouth daily. 90 tablet 3   Cholecalciferol (VITAMIN D3 PO) Take 1 capsule by mouth daily. Takes 5,000 IU daily     Coenzyme Q10 (CVS COQ-10) 200 MG capsule Take 1 capsule (200 mg total) by mouth daily.     Cyanocobalamin (VITAMIN B-12) 5000 MCG SUBL Place 1 tablet under the tongue daily.     cycloSPORINE (RESTASIS) 0.05 % ophthalmic emulsion Place 1 drop into both eyes 2 (two) times daily.     diphenhydrAMINE (BENADRYL ALLERGY) 25 MG tablet Take 0.5 tablets (12.5 mg total) by mouth at bedtime as needed for sleep.     Melatonin 3 MG TABS Take 1 tablet by mouth at bedtime. Spray formulation     MISC NATURAL PRODUCTS IJ Inject as directed. BHRT pellets every 3 months      NP THYROID 60 MG tablet Take 60 mg by mouth every morning.     pravastatin (PRAVACHOL) 40 MG tablet Take 1 tablet (40 mg total) by mouth daily. PLEASE KEEP UPCOMING APPOINTMENT FOR FUTURE REFILLS 90 tablet 0   Probiotic Product (PROBIOTIC PO) Take by mouth daily.     propranolol (INDERAL) 10 MG tablet Take 1 tablet (10 mg total) by mouth at bedtime. Or as needed. 30 tablet 11   Turmeric (QC TUMERIC COMPLEX PO) Take 1,000 mg by mouth.     vitamin C (ASCORBIC ACID) 500 MG tablet Take 500 mg by mouth daily. Takes 2 tablets daily     Zinc 50 MG CAPS Take 1 capsule (50 mg total) by mouth daily.  0   No current facility-administered medications for this visit.    Family History  Problem Relation Age of Onset   Coronary artery disease Father 47       MI?   Other Mother 59       pulm aneurysm   Stroke Maternal Grandmother    Hypertension Sister    Coronary artery disease Brother 67       s/p CABG   Heart disease Sister 71       CHF, deceased   Cancer Neg Hx    Diabetes Neg Hx     Review of Systems  All other systems reviewed and are negative.   Exam:   BP 130/80 (BP Location: Right Arm, Patient Position: Sitting, Cuff Size: Normal)   Ht 5' 4.5" (1.638 m)   Wt 156 lb (70.8 kg)   LMP 12/15/2011   BMI 26.36 kg/m     General appearance: alert, cooperative and appears stated age Head: normocephalic, without obvious abnormality, atraumatic Neck: no adenopathy, supple, symmetrical, trachea midline and thyroid normal to inspection and palpation Lungs: clear to auscultation bilaterally Breasts: normal appearance, no masses or tenderness, No nipple retraction or dimpling, No nipple discharge or bleeding, No axillary adenopathy Heart: regular rate and rhythm Abdomen: soft, non-tender; no masses, no organomegaly Extremities: extremities normal, atraumatic, no cyanosis or edema Skin: skin color, texture, turgor normal. No rashes or lesions Lymph nodes: cervical, supraclavicular, and  axillary nodes normal. Neurologic: grossly normal  Pelvic: External genitalia:  no lesions              No abnormal inguinal nodes palpated.              Urethra:  normal appearing urethra with no masses, tenderness or lesions              Bartholins and Skenes: normal  Vagina: normal appearing vagina with normal color and discharge, no lesions              Cervix: no lesions              Pap taken: yes Bimanual Exam:  Uterus:  normal size, contour, position, consistency, mobility, non-tender              Adnexa: no mass, fullness, tenderness              Rectal exam: yes.  Confirms.              Anus:  normal sphincter tone, no lesions  Chaperone was present for exam:  Emily  Assessment:   Well woman visit with gynecologic exam. HRT.  Testosterone therapy.  Cervical cancer screening.  Osteoporosis screening.  Colon cancer screening.   Plan: Mammogram screening discussed. Self breast awareness reviewed. Pap and HR HPV as above. Guidelines for Calcium, Vitamin D, regular exercise program including cardiovascular and weight bearing exercise. We discussed HRT and potential increased risk of stroke, MI, DVT, PE, breast cancer.  I recommend her testosterone therapy remain in a normal female range.  Will have her labs scanned in to [place].  She will schedule a bone density at [facility].  She will schedule a colonoscopy.  FU in 2 years for breast/pelvic exam and FU prn.  After visit summary provided.   20 min  total time was spent for this patient encounter, including preparation, face-to-face counseling with the patient, coordination of care, and documentation of the encounter in addition to breast and pelvic exam and pap.

## 2022-04-29 ENCOUNTER — Encounter: Payer: Self-pay | Admitting: Cardiology

## 2022-04-29 NOTE — Assessment & Plan Note (Signed)
Probably related to stress more than any else.  Kardia-Mobile did not find any significant findings.  Plan continue with as needed beta-blocker.  Will refill.

## 2022-04-29 NOTE — Assessment & Plan Note (Signed)
Amlodipine chosen for concern for chest pain related to possible microvascular disease.

## 2022-04-29 NOTE — Assessment & Plan Note (Signed)
Borderline BP today but she has been under quite a bit of stress.   She is on amlodipine 5 mg daily.  Barely using PRN propranolol.

## 2022-04-29 NOTE — Assessment & Plan Note (Signed)
Coronary Calcium Score 0 which is very reassuring.  Continue BP and lipid management.  Target LDL less than 100.

## 2022-04-29 NOTE — Assessment & Plan Note (Signed)
LDL probably doing better.  Down to 79 on current dose of pravastatin.  Tolerating well.

## 2022-05-01 ENCOUNTER — Other Ambulatory Visit (HOSPITAL_COMMUNITY)
Admission: RE | Admit: 2022-05-01 | Discharge: 2022-05-01 | Disposition: A | Discharge status: Home / Self Care | Payer: PPO | Source: Ambulatory Visit | Attending: Obstetrics and Gynecology | Admitting: Obstetrics and Gynecology

## 2022-05-01 ENCOUNTER — Ambulatory Visit: Payer: PPO | Admitting: Obstetrics and Gynecology

## 2022-05-01 ENCOUNTER — Encounter: Payer: Self-pay | Admitting: Obstetrics and Gynecology

## 2022-05-01 VITALS — BP 130/80 | Ht 64.5 in | Wt 156.0 lb

## 2022-05-01 DIAGNOSIS — Z79890 Hormone replacement therapy (postmenopausal): Secondary | ICD-10-CM | POA: Diagnosis not present

## 2022-05-01 DIAGNOSIS — Z1151 Encounter for screening for human papillomavirus (HPV): Secondary | ICD-10-CM | POA: Diagnosis not present

## 2022-05-01 DIAGNOSIS — Z01419 Encounter for gynecological examination (general) (routine) without abnormal findings: Secondary | ICD-10-CM | POA: Diagnosis not present

## 2022-05-01 DIAGNOSIS — Z13820 Encounter for screening for osteoporosis: Secondary | ICD-10-CM

## 2022-05-01 DIAGNOSIS — Z124 Encounter for screening for malignant neoplasm of cervix: Secondary | ICD-10-CM

## 2022-05-01 NOTE — Patient Instructions (Signed)

## 2022-05-05 LAB — CYTOLOGY - PAP
Adequacy: ABSENT
Comment: NEGATIVE
Diagnosis: NEGATIVE
High risk HPV: NEGATIVE

## 2022-05-19 ENCOUNTER — Other Ambulatory Visit: Payer: Self-pay | Admitting: Cardiology

## 2022-08-09 DIAGNOSIS — H903 Sensorineural hearing loss, bilateral: Secondary | ICD-10-CM | POA: Diagnosis not present

## 2022-08-09 DIAGNOSIS — H9313 Tinnitus, bilateral: Secondary | ICD-10-CM | POA: Diagnosis not present

## 2022-09-28 LAB — FECAL OCCULT BLOOD, GUAIAC: Fecal Occult Blood: NEGATIVE

## 2022-10-04 ENCOUNTER — Telehealth: Payer: Self-pay

## 2022-10-04 NOTE — Patient Outreach (Signed)
  Care Coordination   10/04/2022 Name: [name] MRN: [id number] DOB: 1955-07-04   Care Coordination Outreach Attempts:  An unsuccessful telephone outreach was attempted today to offer the patient information about available care coordination services. HIPAA compliant message left with call back phone number..  Follow Up Plan:  Additional outreach attempts will be made to offer the patient care coordination information and services.   Encounter Outcome:  No Answer   Care Coordination Interventions:  No, not indicated    George Ina [facility] Care Coordination [phone] direct line

## 2022-10-16 ENCOUNTER — Other Ambulatory Visit: Payer: Self-pay | Admitting: Family Medicine

## 2022-10-16 DIAGNOSIS — E785 Hyperlipidemia, unspecified: Secondary | ICD-10-CM

## 2022-10-16 DIAGNOSIS — Z8249 Family history of ischemic heart disease and other diseases of the circulatory system: Secondary | ICD-10-CM

## 2022-10-16 DIAGNOSIS — E559 Vitamin D deficiency, unspecified: Secondary | ICD-10-CM

## 2022-10-17 ENCOUNTER — Other Ambulatory Visit: Payer: Self-pay | Admitting: Radiology

## 2022-10-17 DIAGNOSIS — E785 Hyperlipidemia, unspecified: Secondary | ICD-10-CM

## 2022-10-17 DIAGNOSIS — E559 Vitamin D deficiency, unspecified: Secondary | ICD-10-CM

## 2022-10-19 ENCOUNTER — Other Ambulatory Visit: Payer: PPO

## 2022-10-19 LAB — COMPREHENSIVE METABOLIC PANEL
AG Ratio: 1.9 (calc) (ref 1.0–2.5)
ALT: 12 U/L (ref 6–29)
AST: 14 U/L (ref 10–35)
Albumin: 4.2 g/dL (ref 3.6–5.1)
Alkaline phosphatase (APISO): 51 U/L (ref 37–153)
BUN: 15 mg/dL (ref 7–25)
CO2: 29 mmol/L (ref 20–32)
Calcium: 9.3 mg/dL (ref 8.6–10.4)
Chloride: 104 mmol/L (ref 98–110)
Creat: 0.6 mg/dL (ref 0.50–1.05)
Globulin: 2.2 g/dL (calc) (ref 1.9–3.7)
Glucose, Bld: 78 mg/dL (ref 65–99)
Potassium: 4.3 mmol/L (ref 3.5–5.3)
Sodium: 139 mmol/L (ref 135–146)
Total Bilirubin: 0.5 mg/dL (ref 0.2–1.2)
Total Protein: 6.4 g/dL (ref 6.1–8.1)

## 2022-10-19 LAB — LIPID PANEL
Cholesterol: 182 mg/dL (ref <200)
HDL: 88 mg/dL (ref >=50)
LDL Cholesterol (Calc): 80 mg/dL (calc)
Non-HDL Cholesterol (Calc): 94 mg/dL (calc) (ref <130)
Total CHOL/HDL Ratio: 2.1 (calc) (ref <5.0)
Triglycerides: 68 mg/dL (ref <150)

## 2022-10-19 LAB — VITAMIN D 25 HYDROXY (VIT D DEFICIENCY, FRACTURES): Vit D, 25-Hydroxy: 64 ng/mL (ref 30–100)

## 2022-10-26 ENCOUNTER — Ambulatory Visit (INDEPENDENT_AMBULATORY_CARE_PROVIDER_SITE_OTHER): Payer: 59 | Admitting: Family Medicine

## 2022-10-26 ENCOUNTER — Encounter: Payer: Self-pay | Admitting: Family Medicine

## 2022-10-26 VITALS — BP 138/70 | HR 77 | Temp 97.6°F | Ht 64.5 in | Wt 153.4 lb

## 2022-10-26 DIAGNOSIS — I10 Essential (primary) hypertension: Secondary | ICD-10-CM | POA: Diagnosis not present

## 2022-10-26 DIAGNOSIS — I2510 Atherosclerotic heart disease of native coronary artery without angina pectoris: Secondary | ICD-10-CM

## 2022-10-26 DIAGNOSIS — Z8249 Family history of ischemic heart disease and other diseases of the circulatory system: Secondary | ICD-10-CM

## 2022-10-26 DIAGNOSIS — E785 Hyperlipidemia, unspecified: Secondary | ICD-10-CM

## 2022-10-26 DIAGNOSIS — Z7189 Other specified counseling: Secondary | ICD-10-CM | POA: Diagnosis not present

## 2022-10-26 DIAGNOSIS — Z0000 Encounter for general adult medical examination without abnormal findings: Secondary | ICD-10-CM

## 2022-10-26 DIAGNOSIS — H9313 Tinnitus, bilateral: Secondary | ICD-10-CM

## 2022-10-26 DIAGNOSIS — E559 Vitamin D deficiency, unspecified: Secondary | ICD-10-CM

## 2022-10-26 DIAGNOSIS — F5104 Psychophysiologic insomnia: Secondary | ICD-10-CM

## 2022-10-26 MED ORDER — AMLODIPINE BESYLATE 5 MG PO TABS: 5.0000 mg | ORAL_TABLET | Freq: Every day | ORAL | 4 refills | Status: DC

## 2022-10-26 MED ORDER — ALPRAZOLAM 0.5 MG PO TABS: 0.2500 mg | ORAL_TABLET | Freq: Every evening | ORAL | 0 refills | Status: AC | PRN

## 2022-10-26 NOTE — Assessment & Plan Note (Addendum)
Mother with pulmonary aneurysm age 63s  Pt asxs. Consider CXR in next few years.

## 2022-10-26 NOTE — Assessment & Plan Note (Signed)
Chronic, stable on current regimen of only amlodipine without recent propranolol use- continue.

## 2022-10-26 NOTE — Assessment & Plan Note (Signed)
Preventative protocols reviewed and updated unless pt declined. Discussed healthy diet and lifestyle.  

## 2022-10-26 NOTE — Assessment & Plan Note (Signed)
Saw audiology/ENT, found to have mild hearing loss.

## 2022-10-26 NOTE — Assessment & Plan Note (Signed)
Advanced directive - needs this updated. Thinks daughter would be HCPOA. Asked to bring [place] copy when complete.

## 2022-10-26 NOTE — Assessment & Plan Note (Addendum)
Chronic, stable on pravastatin - continue this.  CAC score of zero 2024 The 10-year ASCVD risk score (Arnett DK, et al., 2019) is: 6.8%   Values used to calculate the score:     Age: 67 years     Sex: Female     Is Non-Hispanic African American: No     Diabetic: No     Tobacco smoker: No     Systolic Blood Pressure: 138 mmHg     Is BP treated: Yes     HDL Cholesterol: 88 mg/dL     Total Cholesterol: 182 mg/dL

## 2022-10-26 NOTE — Progress Notes (Signed)
Ph: [phone] Fax: [phone]   Patient ID: [name], female    DOB: 1955/08/11, 67 y.o.   MRN: [id number]  This visit was conducted in person.  BP 138/70   Pulse 77   Temp 97.6 F (36.4 C) (Temporal)   Ht 5' 4.5" (1.638 m)   Wt 153 lb 6 oz (69.6 kg)   LMP 12/15/2011   SpO2 100%   BMI 25.92 kg/m    CC: CPE Subjective:   HPI: [name] is a 67 y.o. 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  Medicare started 03/2022 but then stopped when she returned to work.    Microvascular ischemia CAD followed by Dr Herbie Baltimore. Strong fmhx CAD. On pravastatin 40mg  daily, reassuring coronary calcium score of zero (10/2019).   Saw [facility] 08/2022 for ongoing tinnitus - found to have high frequency hearing loss bilaterally L>R. Suggested trial amplification. She's started using sound machine.   On NP thyroid, estrogen and testosterone hormone pellets and oral progesterone through [facility] clinic in [place].   Mother diagnosed age 32yo with pulmonary aneurysm.  No other fmhx aneurysm.   Preventative: COLONOSCOPY Date: 2008 small hyperplastic polyps, small int hem, early diverticula ([place]) rpt 10 yrs. iFOB through work normal 06/2021, 09/2022.  Mammogram - 01/2022 Birdas1 @ [facility]  Well woman with OBGYN Dr. Conley Simmonds - last seen 04/2022 with pap done. Ongoing hot flashes, started HRT 09/2021 (E, P, T) through [facility] in [place]. LMP ~2014  Lung cancer screening - not eligible Flu shot yearly COVID vaccine - Pfizer 04/2019, 05/2019, booster 01/2020 , bivalent 12/2020 Pneumococcal vaccine - desires to defer Tdap - 2012, 10/2021 Shingrix - 01/2018, 05/2018  RSV - Fall 2023 Advanced directive - needs this updated. Thinks daughter would be HCPOA. Asked to bring [place] copy when complete.  Seat belt [place] discussed. Sunscreen use discussed. H/o melanoma, followed  regularly by derm yearly (Dr Amy Swaz[place]and at [facility] derm)  Sleep - averaging 7-8 hours/night  Non smoker Alcohol - enjoys seltzers  Dentist q6 mo Eye exam yearly  Caffeine: 2 cups coffee/day   Widower [name] 06/2018) 2 dogs.   Grown children   Occupation: [employer] rep Activity: walks 3 mi 3x/wk (1 hour), started going to gym  Diet: good water, daily fruits/vegetables, red meat 1x/wk, fish seldom      Relevant past medical, surgical, family and social history reviewed and updated as indicated. Interim medical history since our last visit reviewed. Allergies and medications reviewed and updated. Outpatient Medications Prior to Visit  Medication Sig Dispense Refill   Cholecalciferol (VITAMIN D3 PO) Take 1 capsule by mouth daily. Takes 5,000 IU daily     Coenzyme Q10 (CVS COQ-10) 200 MG capsule Take 1 capsule (200 mg total) by mouth daily.     Cyanocobalamin (VITAMIN B-12) 5000 MCG SUBL Place 1 tablet under the tongue daily.     cycloSPORINE (RESTASIS) 0.05 % ophthalmic emulsion Place 1 drop into both eyes 2 (two) times daily.     diphenhydrAMINE (BENADRYL ALLERGY) 25 MG tablet Take 0.5 tablets (12.5 mg total) by mouth at bedtime as needed for sleep.     Melatonin 3 MG TABS Take 1 tablet by mouth at bedtime. Spray formulation     MISC NATURAL PRODUCTS IJ Inject as directed. BHRT pellets every 3 months     NP THYROID 60 MG tablet Take  60 mg by mouth every morning.     pravastatin (PRAVACHOL) 40 MG tablet TAKE 1 TABLET BY MOUTH DAILY *KEEP UPCOMING APPOINTMENT FOR REFILLS* 90 tablet 1   Probiotic Product (PROBIOTIC PO) Take by mouth daily.     progesterone (PROMETRIUM) 200 MG capsule Take 1 capsule (200 mg total) by mouth daily.     propranolol (INDERAL) 10 MG tablet Take 1 tablet (10 mg total) by mouth at bedtime. Or as needed. 30 tablet 11   Turmeric (QC TUMERIC COMPLEX PO) Take 1,000 mg by mouth.     vitamin C (ASCORBIC ACID) 500 MG tablet Take 500 mg by mouth daily. Takes 2 tablets daily      Zinc 50 MG CAPS Take 1 capsule (50 mg total) by mouth daily.  0   ALPRAZolam (XANAX) 0.5 MG tablet Take 0.5 tablets (0.25 mg total) by mouth at bedtime as needed for sleep. 30 tablet 1   amLODipine (NORVASC) 5 MG tablet Take 1 tablet (5 mg total) by mouth daily. 90 tablet 3   No facility-administered medications prior to visit.     Per HPI unless specifically indicated in ROS section below Review of Systems  Constitutional:  Negative for activity change, appetite change, chills, fatigue, fever and unexpected weight change.  HENT:  Negative for hearing loss.   Eyes:  Negative for visual disturbance.  Respiratory:  Negative for cough, chest tightness, shortness of breath and wheezing.   Cardiovascular:  Positive for leg swelling (occ ankle and hand swelling). Negative for chest pain and palpitations.  Gastrointestinal:  Negative for abdominal distention, abdominal pain, blood in stool, constipation, diarrhea, nausea and vomiting.  Genitourinary:  Negative for difficulty urinating and hematuria.  Musculoskeletal:  Negative for arthralgias, myalgias and neck pain.  Skin:  Negative for rash.  Neurological:  Negative for dizziness, seizures, syncope and headaches.  Hematological:  Negative for adenopathy. Does not bruise/bleed easily.  Psychiatric/Behavioral:  Negative for dysphoric mood. The patient is not nervous/anxious.     Objective:  BP 138/70   Pulse 77   Temp 97.6 F (36.4 C) (Temporal)   Ht 5' 4.5" (1.638 m)   Wt 153 lb 6 oz (69.6 kg)   LMP 12/15/2011   SpO2 100%   BMI 25.92 kg/m   Wt Readings from Last 3 Encounters:  10/26/22 153 lb 6 oz (69.6 kg)  05/01/22 156 lb (70.8 kg)  04/17/22 153 lb 9.6 oz (69.7 kg)      Physical Exam Vitals and nursing note reviewed.  Constitutional:      Appearance: Normal appearance. She is not ill-appearing.  HENT:     Head: Normocephalic and atraumatic.     Right Ear: Tympanic membrane, ear canal and external ear normal. There is no  impacted cerumen.     Left Ear: Tympanic membrane, ear canal and external ear normal. There is no impacted cerumen.     Nose: Nose normal.     Mouth/Throat:     Mouth: Mucous membranes are moist.     Pharynx: Oropharynx is clear. No oropharyngeal exudate or posterior oropharyngeal erythema.  Eyes:     General:        Right eye: No discharge.        Left eye: No discharge.     Extraocular Movements: Extraocular movements intact.     Conjunctiva/sclera: Conjunctivae normal.     Pupils: Pupils are equal, round, and reactive to light.  Neck:     Thyroid: No thyroid mass or thyromegaly.  Cardiovascular:     Rate and Rhythm: Normal rate and regular rhythm.     Pulses: Normal pulses.     Heart sounds: Normal heart sounds. No murmur heard. Pulmonary:     Effort: Pulmonary effort is normal. No respiratory distress.     Breath sounds: Normal breath sounds. No wheezing, rhonchi or rales.  Abdominal:     General: Bowel sounds are normal. There is no distension.     Palpations: Abdomen is soft. There is no mass.     Tenderness: There is no abdominal tenderness. There is no guarding or rebound.     Hernia: No hernia is present.  Musculoskeletal:     Cervical back: Normal range of motion and neck supple. No rigidity.     Right lower leg: No edema.     Left lower leg: No edema.  Lymphadenopathy:     Cervical: No cervical adenopathy.  Skin:    General: Skin is warm and dry.     Findings: No rash.  Neurological:     General: No focal deficit present.     Mental Status: She is alert. Mental status is at baseline.  Psychiatric:        Mood and Affect: Mood normal.        Behavior: Behavior normal.       Results for orders placed or performed in visit on 10/17/22  VITAMIN D 25 Hydroxy (Vit-D Deficiency, Fractures)  Result Value Ref Range   Vit D, 25-Hydroxy 64 30 - 100 ng/mL  Comprehensive metabolic panel  Result Value Ref Range   Glucose, Bld 78 65 - 99 mg/dL   BUN 15 7 - 25 mg/dL    Creat 2.70 3.50 - 0.93 mg/dL   BUN/Creatinine Ratio SEE NOTE: 6 - 22 (calc)   Sodium 139 135 - 146 mmol/L   Potassium 4.3 3.5 - 5.3 mmol/L   Chloride 104 98 - 110 mmol/L   CO2 29 20 - 32 mmol/L   Calcium 9.3 8.6 - 10.4 mg/dL   Total Protein 6.4 6.1 - 8.1 g/dL   Albumin 4.2 3.6 - 5.1 g/dL   Globulin 2.2 1.9 - 3.7 g/dL (calc)   AG Ratio 1.9 1.0 - 2.5 (calc)   Total Bilirubin 0.5 0.2 - 1.2 mg/dL   Alkaline phosphatase (APISO) 51 37 - 153 U/L   AST 14 10 - 35 U/L   ALT 12 6 - 29 U/L  Lipid panel  Result Value Ref Range   Cholesterol 182 <200 mg/dL   HDL 88 > OR = 50 mg/dL   Triglycerides 68 <818 mg/dL   LDL Cholesterol (Calc) 80 mg/dL (calc)   Total CHOL/HDL Ratio 2.1 <5.0 (calc)   Non-HDL Cholesterol (Calc) 94 <299 mg/dL (calc)    Assessment & Plan:   Problem List Items Addressed This Visit     Hyperlipidemia with target LDL less than 100 (Chronic)    Chronic, stable on pravastatin - continue this.  CAC score of zero 2024 The 10-year ASCVD risk score (Arnett DK, et al., 2019) is: 6.8%   Values used to calculate the score:     Age: 50 years     Sex: Female     Is Non-Hispanic African American: No     Diabetic: No     Tobacco smoker: No     Systolic Blood Pressure: 138 mmHg     Is BP treated: Yes     HDL Cholesterol: 88 mg/dL     Total Cholesterol: 182  mg/dL       Relevant Medications   amLODipine (NORVASC) 5 MG tablet   Health maintenance examination - Primary (Chronic)    Preventative protocols reviewed and updated unless pt declined. Discussed healthy diet and lifestyle.       Family history of early CAD (Chronic)   Microvascular coronary disease (Chronic)   Relevant Medications   amLODipine (NORVASC) 5 MG tablet   Essential hypertension (Chronic)    Chronic, stable on current regimen of only amlodipine without recent propranolol use- continue.       Relevant Medications   amLODipine (NORVASC) 5 MG tablet   Advanced directives, counseling/discussion  (Chronic)    Advanced directive - needs this updated. Thinks daughter would be HCPOA. Asked to bring [place] copy when complete.       Chronic insomnia    Overall improving. Continues PRN melatonin      Vitamin D deficiency    Continue 5000 international units  daily.       Tinnitus    Saw audiology/ENT, found to have mild hearing loss.       Family history of aneurysm    Mother with pulmonary aneurysm age 71s  Pt asxs. Consider CXR in next few years.         Meds ordered this encounter  Medications   amLODipine (NORVASC) 5 MG tablet    Sig: Take 1 tablet (5 mg total) by mouth daily.    Dispense:  90 tablet    Refill:  4   ALPRAZolam (XANAX) 0.5 MG tablet    Sig: Take 0.5 tablets (0.25 mg total) by mouth at bedtime as needed for sleep.    Dispense:  20 tablet    Refill:  0    No orders of the defined types were placed in this encounter.   Patient Instructions  You are doing well today  Good to see you Return as needed or in 1 year for next physical   Follow up plan: Return in about 1 year (around 10/26/2023), or if symptoms worsen or fail to improve, for medicare wellness visit, annual exam, prior fasting for blood work.  Eustaquio Boyden, MD

## 2022-10-26 NOTE — Assessment & Plan Note (Signed)
Overall improving. Continues PRN melatonin

## 2022-10-26 NOTE — Patient Instructions (Signed)
You are doing well today  Good to see you Return as needed or in 1 year for next physical

## 2022-10-26 NOTE — Assessment & Plan Note (Signed)
Continue 5000 international units daily

## 2022-10-29 ENCOUNTER — Encounter: Payer: Self-pay | Admitting: Family Medicine

## 2022-11-14 ENCOUNTER — Other Ambulatory Visit: Payer: Self-pay | Admitting: Cardiology

## 2022-12-13 ENCOUNTER — Encounter: Payer: Self-pay | Admitting: Cardiology

## 2023-01-20 NOTE — Progress Notes (Signed)
This patient is appearing on a report for being at risk of failing the adherence measure for cholesterol (statin) medications this calendar year.   Medication: pravastatin 40 mg PO daily Last fill date: 11/14/22 for 90 day supply  Insurance report was not up to date. No action needed at this time.   Nils Pyle, PharmD PGY1 Pharmacy Resident

## 2023-01-25 LAB — HM MAMMOGRAPHY

## 2023-01-28 ENCOUNTER — Encounter: Payer: Self-pay | Admitting: Family Medicine

## 2023-02-06 DIAGNOSIS — R922 Inconclusive mammogram: Secondary | ICD-10-CM | POA: Diagnosis not present

## 2023-02-07 ENCOUNTER — Encounter: Payer: Self-pay | Admitting: Family Medicine

## 2023-02-08 HISTORY — PX: BREAST BIOPSY: SHX20

## 2023-02-16 ENCOUNTER — Telehealth: Payer: Self-pay | Admitting: Family Medicine

## 2023-02-16 NOTE — Telephone Encounter (Signed)
Pt gets mammograms through [facility].  She had recent abnormal L mammo, breast US, rec biopsy of small mass L breast.  Can we call to see when this is scheduled for? Thanks.

## 2023-02-18 NOTE — Telephone Encounter (Signed)
Spoke with pt asking when bx is scheduled. States it's scheduled for tomorrow, 02/19/23.

## 2023-02-19 ENCOUNTER — Other Ambulatory Visit: Payer: Self-pay | Admitting: Radiology

## 2023-02-19 DIAGNOSIS — N6489 Other specified disorders of breast: Secondary | ICD-10-CM | POA: Diagnosis not present

## 2023-02-19 DIAGNOSIS — N6321 Unspecified lump in the left breast, upper outer quadrant: Secondary | ICD-10-CM | POA: Diagnosis not present

## 2023-02-20 LAB — SURGICAL PATHOLOGY

## 2023-02-22 ENCOUNTER — Encounter: Payer: Self-pay | Admitting: Family Medicine

## 2023-02-24 ENCOUNTER — Other Ambulatory Visit: Payer: Self-pay | Admitting: Cardiology

## 2023-03-20 ENCOUNTER — Telehealth: Payer: PPO | Admitting: Physician Assistant

## 2023-03-20 DIAGNOSIS — U071 COVID-19: Secondary | ICD-10-CM | POA: Diagnosis not present

## 2023-03-20 MED ORDER — NIRMATRELVIR/RITONAVIR (PAXLOVID)TABLET: 3.0000 | ORAL_TABLET | Freq: Two times a day (BID) | ORAL | 0 refills | Status: AC

## 2023-03-20 NOTE — Progress Notes (Signed)
Virtual Visit Consent   [name], you are scheduled for a virtual visit with a [facility] provider today. Just as with appointments in the office, your consent must be obtained to participate. Your consent will be active for this visit and any virtual visit you may have with one of our providers in the next 365 days. If you have a MyChart account, a copy of this consent can be sent to you electronically.  As this is a virtual visit, video technology does not allow for your provider to perform a traditional examination. This may limit your provider's ability to fully assess your condition. If your provider identifies any concerns that need to be evaluated in person or the need to arrange testing (such as labs, EKG, etc.), we will make arrangements to do so. Although advances in technology are sophisticated, we cannot ensure that it will always work on either your end or our end. If the connection with a video visit is poor, the visit may have to be switched to a telephone visit. With either a video or telephone visit, we are not always able to ensure that we have a secure connection.  By engaging in this virtual visit, you consent to the provision of healthcare and authorize for your insurance to be billed (if applicable) for the services provided during this visit. Depending on your insurance coverage, you may receive a charge related to this service.  I need to obtain your verbal consent now. Are you willing to proceed with your visit today? [name] has provided verbal consent on 03/20/2023 for a virtual visit (video or telephone). [name], [place]  Date: 03/20/2023 6:05 PM  Virtual Visit via Video Note   I, [name], connected with  [name]  ([id number], Oct 08, 1955) on 03/20/23 at  6:00 PM EST by a video-enabled telemedicine application and verified that I am speaking with the correct person using two identifiers.  Location: Patient: Virtual Visit Location Patient:  Home Provider: Virtual Visit Location Provider: Home Office   I discussed the limitations of evaluation and management by telemedicine and the availability of in person appointments. The patient expressed understanding and agreed to proceed.    History of Present Illness: [name] is a 67 y.o. who identifies as a female who was assigned female at birth, and is being seen today for covid symptoms including headache, post nasal drainage and congestion.  HPI: 67 y/o F presents via telehealth video visit for testing positive for covid home test earlier today. +congestion, slight cough, post nasal drainage and headache. Denies fever or CP, or wheezing, or body aches. Has been taking Tylenol for headache.     Problems:  Patient Active Problem List   Diagnosis Date Noted   Family history of aneurysm 10/26/2022   Advanced directives, counseling/discussion 10/26/2022   Tinnitus 10/23/2021   Nocturnal leg cramps 10/19/2020   Heart palpitations 10/15/2020   Essential hypertension 10/09/2018   CTS (carpal tunnel syndrome) 03/22/2016   Microvascular coronary disease 03/16/2014   DOE (dyspnea on exertion) 05/02/2013   Health maintenance examination 01/14/2013   Family history of early CAD 01/14/2013   Vitamin D deficiency    Hyperlipidemia with target LDL less than 100    Chronic insomnia 11/21/2011    Allergies: No Known Allergies Medications:  Current Outpatient Medications:    nirmatrelvir/ritonavir (PAXLOVID) 20 x 150 MG & 10 x 100MG  TABS, Take 3 tablets by mouth 2 (two) times daily for 5 days. (Take nirmatrelvir  150 mg two tablets twice daily for 5 days and ritonavir 100 mg one tablet twice daily for 5 days) No history of kidney disease. BUN and Creatinine within normal limits., Disp: 30 tablet, Rfl: 0   ALPRAZolam (XANAX) 0.5 MG tablet, Take 0.5 tablets (0.25 mg total) by mouth at bedtime as needed for sleep., Disp: 20 tablet, Rfl: 0   amLODipine (NORVASC) 5 MG tablet, Take 1 tablet  (5 mg total) by mouth daily., Disp: 90 tablet, Rfl: 4   Cholecalciferol (VITAMIN D3 PO), Take 1 capsule by mouth daily. Takes 5,000 IU daily, Disp: , Rfl:    Coenzyme Q10 (CVS COQ-10) 200 MG capsule, Take 1 capsule (200 mg total) by mouth daily., Disp:  , Rfl:    Cyanocobalamin (VITAMIN B-12) 5000 MCG SUBL, Place 1 tablet under the tongue daily., Disp: , Rfl:    cycloSPORINE (RESTASIS) 0.05 % ophthalmic emulsion, Place 1 drop into both eyes 2 (two) times daily., Disp: , Rfl:    diphenhydrAMINE (BENADRYL ALLERGY) 25 MG tablet, Take 0.5 tablets (12.5 mg total) by mouth at bedtime as needed for sleep., Disp: , Rfl:    Melatonin 3 MG TABS, Take 1 tablet by mouth at bedtime. Spray formulation, Disp: , Rfl:    MISC NATURAL PRODUCTS IJ, Inject as directed. BHRT pellets every 3 months, Disp: , Rfl:    NP THYROID 60 MG tablet, Take 60 mg by mouth every morning., Disp: , Rfl:    pravastatin (PRAVACHOL) 40 MG tablet, TAKE 1 TABLET BY MOUTH DAILY *KEEP UPCOMING APPOINTMENT FOR REFILLS*, Disp: 90 tablet, Rfl: 1   Probiotic Product (PROBIOTIC PO), Take by mouth daily., Disp: , Rfl:    progesterone (PROMETRIUM) 200 MG capsule, Take 1 capsule (200 mg total) by mouth daily., Disp: , Rfl:    propranolol (INDERAL) 10 MG tablet, TAKE 1 TABLET BY MOUTH AT BEDTIME OR AS NEEDED, Disp: 90 tablet, Rfl: 3   Turmeric (QC TUMERIC COMPLEX PO), Take 1,000 mg by mouth., Disp: , Rfl:    vitamin C (ASCORBIC ACID) 500 MG tablet, Take 500 mg by mouth daily. Takes 2 tablets daily, Disp: , Rfl:    Zinc 50 MG CAPS, Take 1 capsule (50 mg total) by mouth daily., Disp: , Rfl: 0  Observations/Objective: Patient is well-developed, well-nourished in no acute distress.  Resting comfortably  at home.  Head is normocephalic, atraumatic.  No labored breathing.  Speech is clear and coherent with logical content.  Patient is alert and oriented at baseline.    Assessment and Plan: 1. COVID-19 virus infection - nirmatrelvir/ritonavir  (PAXLOVID) 20 x 150 MG & 10 x 100MG  TABS; Take 3 tablets by mouth 2 (two) times daily for 5 days. (Take nirmatrelvir 150 mg two tablets twice daily for 5 days and ritonavir 100 mg one tablet twice daily for 5 days) No history of kidney disease. BUN and Creatinine within normal limits.  Dispense: 30 tablet; Refill: 0  Stay well hydrated. Reviewed CDC recommendations for Covid infection. Stay home for fever. Wear a mask for cough as needed.  Take otc symptom relief medicine.  Pt requested Paxlovid. No h/o kidney disease.Reviewed lab work. Reviewed common side effects to Paxlovid and she verbalized understanding. Rx sent to pharmacy.  Pt verbalized understanding and in agreement.    Follow Up Instructions: I discussed the assessment and treatment plan with the patient. The patient was provided an opportunity to ask questions and all were answered. The patient agreed with the plan and demonstrated an understanding of  the instructions.  A copy of instructions were sent to the patient via MyChart unless otherwise noted below.   Patient has requested to receive PHI (AVS, Work Notes, etc) pertaining to this video visit through e-mail as they are currently without active MyChart. They have voiced understand that email is not considered secure and their health information could be viewed by someone other than the patient.   The patient was advised to call back or seek an in-person evaluation if the symptoms worsen or if the condition fails to improve as anticipated.    [name], PA-C

## 2023-03-20 NOTE — Patient Instructions (Signed)
[name], thank you for joining [name], PA-C for today's virtual visit.  While this provider is not your primary care provider (PCP), if your PCP is located in our provider database this encounter information will be shared with them immediately following your visit.   A [facility] MyChart account gives you access to today's visit and all your visits, tests, and labs performed at [facility] " click here if you don't have a [facility] MyChart account or go to [email or web address]  Consent: (Patient) [name] provided verbal consent for this virtual visit at the beginning of the encounter.  Current Medications:  Current Outpatient Medications:    nirmatrelvir/ritonavir (PAXLOVID) 20 x 150 MG & 10 x 100MG  TABS, Take 3 tablets by mouth 2 (two) times daily for 5 days. (Take nirmatrelvir 150 mg two tablets twice daily for 5 days and ritonavir 100 mg one tablet twice daily for 5 days) No history of kidney disease. BUN and Creatinine within normal limits., Disp: 30 tablet, Rfl: 0   ALPRAZolam (XANAX) 0.5 MG tablet, Take 0.5 tablets (0.25 mg total) by mouth at bedtime as needed for sleep., Disp: 20 tablet, Rfl: 0   amLODipine (NORVASC) 5 MG tablet, Take 1 tablet (5 mg total) by mouth daily., Disp: 90 tablet, Rfl: 4   Cholecalciferol (VITAMIN D3 PO), Take 1 capsule by mouth daily. Takes 5,000 IU daily, Disp: , Rfl:    Coenzyme Q10 (CVS COQ-10) 200 MG capsule, Take 1 capsule (200 mg total) by mouth daily., Disp:  , Rfl:    Cyanocobalamin (VITAMIN B-12) 5000 MCG SUBL, Place 1 tablet under the tongue daily., Disp: , Rfl:    cycloSPORINE (RESTASIS) 0.05 % ophthalmic emulsion, Place 1 drop into both eyes 2 (two) times daily., Disp: , Rfl:    diphenhydrAMINE (BENADRYL ALLERGY) 25 MG tablet, Take 0.5 tablets (12.5 mg total) by mouth at bedtime as needed for sleep., Disp: , Rfl:    Melatonin 3 MG TABS, Take 1 tablet by mouth at bedtime. Spray formulation, Disp: , Rfl:    MISC  NATURAL PRODUCTS IJ, Inject as directed. BHRT pellets every 3 months, Disp: , Rfl:    NP THYROID 60 MG tablet, Take 60 mg by mouth every morning., Disp: , Rfl:    pravastatin (PRAVACHOL) 40 MG tablet, TAKE 1 TABLET BY MOUTH DAILY *KEEP UPCOMING APPOINTMENT FOR REFILLS*, Disp: 90 tablet, Rfl: 1   Probiotic Product (PROBIOTIC PO), Take by mouth daily., Disp: , Rfl:    progesterone (PROMETRIUM) 200 MG capsule, Take 1 capsule (200 mg total) by mouth daily., Disp: , Rfl:    propranolol (INDERAL) 10 MG tablet, TAKE 1 TABLET BY MOUTH AT BEDTIME OR AS NEEDED, Disp: 90 tablet, Rfl: 3   Turmeric (QC TUMERIC COMPLEX PO), Take 1,000 mg by mouth., Disp: , Rfl:    vitamin C (ASCORBIC ACID) 500 MG tablet, Take 500 mg by mouth daily. Takes 2 tablets daily, Disp: , Rfl:    Zinc 50 MG CAPS, Take 1 capsule (50 mg total) by mouth daily., Disp: , Rfl: 0   Medications ordered in this encounter:  Meds ordered this encounter  Medications   nirmatrelvir/ritonavir (PAXLOVID) 20 x 150 MG & 10 x 100MG  TABS    Sig: Take 3 tablets by mouth 2 (two) times daily for 5 days. (Take nirmatrelvir 150 mg two tablets twice daily for 5 days and ritonavir 100 mg one tablet twice daily for 5 days) No history of kidney disease. BUN and Creatinine within  normal limits.    Dispense:  30 tablet    Refill:  0    Order Specific Question:   Supervising Provider    Answer:   Merrilee Jansky [id number]     *If you need refills on other medications prior to your next appointment, please contact your pharmacy*  Follow-Up: Call back or seek an in-person evaluation if the symptoms worsen or if the condition fails to improve as anticipated.  [facility] [phone]  Other Instructions Stay well hydrated. Reviewed CDC recommendations for Covid infection. Stay home for fever otherwise you may wear a mask for cough.  Take over the counter symptom relief medicine.  Pt requested Paxlovid. No h/o kidney disease.Reviewed lab  work. Reviewed common side effects to Paxlovid and she verbalized understanding.   If you have been instructed to have an in-person evaluation today at a local Urgent Care facility, please use the link below. It will take you to a list of all of our available [facility], including address, phone number and hours of operation. Please do not delay care.  [facility]  If you or a family member do not have a primary care provider, use the link below to schedule a visit and establish care. When you choose a [facility] primary care physician or advanced practice provider, you gain a long-term partner in health. Find a Primary Care Provider  Learn more about [facility] in-office and virtual care options: [facility] - Get Care Now

## 2023-03-21 ENCOUNTER — Telehealth: Payer: Self-pay

## 2023-03-21 NOTE — Telephone Encounter (Signed)
Per chart review tab pt had Anton televisit on 03/20/23. Sending note to Dr Reece Agar.

## 2023-03-21 NOTE — Telephone Encounter (Signed)
Seen virtually last night, prescribed full dose Paxlovid.  I hope she starts feeling better quickly.  Paxlovid can interact with her amlodipine increasing BP lowering effect - monitor BP and if starts dropping would suggesting cutting amlodipine in half while on Paxlovid.

## 2023-03-21 NOTE — Telephone Encounter (Signed)
Spoke with pt relaying Dr Timoteo Expose message. Pt verbalizes understanding and expresses her thanks for letting her know. She will monitor BP daily while taking Paxlovid.

## 2023-03-22 ENCOUNTER — Encounter: Payer: Self-pay | Admitting: Family Medicine

## 2023-03-22 MED ORDER — LIDOCAINE VISCOUS HCL 2 % MT SOLN: 15.0000 mL | OROMUCOSAL | 0 refills | Status: DC | PRN

## 2023-04-01 ENCOUNTER — Ambulatory Visit: Payer: 59

## 2023-04-17 ENCOUNTER — Ambulatory Visit: Payer: 59 | Attending: Cardiology | Admitting: Cardiology

## 2023-04-17 VITALS — BP 142/78 | HR 75 | Ht 65.0 in | Wt 158.0 lb

## 2023-04-17 DIAGNOSIS — R002 Palpitations: Secondary | ICD-10-CM

## 2023-04-17 DIAGNOSIS — Z8249 Family history of ischemic heart disease and other diseases of the circulatory system: Secondary | ICD-10-CM | POA: Diagnosis not present

## 2023-04-17 DIAGNOSIS — E785 Hyperlipidemia, unspecified: Secondary | ICD-10-CM

## 2023-04-17 DIAGNOSIS — I10 Essential (primary) hypertension: Secondary | ICD-10-CM

## 2023-04-17 DIAGNOSIS — I2510 Atherosclerotic heart disease of native coronary artery without angina pectoris: Secondary | ICD-10-CM

## 2023-04-17 NOTE — Progress Notes (Signed)
 Cardiology Office Note:  .   Date:  04/25/2023  ID:  Orie CHRISTELLA Bloch, DOB 1955-04-20, MRN [id number] PCP: Rilla Baller, MD  [facility] Providers Cardiologist:  Alm Clay, MD     Chief Complaint  Patient presents with   Follow-up    Annual follow-up.  Doing well.  BP has been running up a little bit.  No palpitations.    Patient Profile: [name] is a  68 y.o. female-widow with a PMH notable for Family History of Premature CAD (Coronary Calcium  Score 0 during the elevation), borderline HTN, intermittent palpitations and HLD who presents here for annual follow-up at the request of Rilla Baller, MD.  Her deceased husband Alm Lin was a longstanding patient of mine    [name] was last seen on April 17, 2022-stable Dr. Dorn Lesches standpoint.  Lots of stress having been recently laid off of her job and chose to go with the retirement.  She did buy a Kardia mobile monitor to evaluate palpitations that had gotten worse with all the stress and she had used occasional doses of PRN propranolol .  Subjective  Discussed the use of AI scribe software for clinical note transcription with the patient, who gave verbal consent to proceed.  History of Present Illness   The patient, known to have hypertension and hyperlipidemia, presents with concerns about elevated blood pressure readings. They report that their blood pressure has been fluctuating, occasionally reaching 140/90 mmHg. They deny any associated symptoms such as headaches, blurred vision, or dizziness. The patient is currently on amlodipine  for blood pressure control and pravastatin  with CoQ10 for cholesterol management.  In addition, the patient has been experiencing palpitations, described as random and infrequent. They have propranolol  prescribed as needed for these episodes, but they have not yet needed to take it.  The patient also reports a recent history of COVID-19 infection in  December, which they describe as having significantly impacted their health. They experienced symptoms including throat pain and congestion, but deny any shortness of breath or chest pain during the illness. They report that they are still not feeling back to their baseline health, with persistent feelings of fatigue and a sensation of upper respiratory congestion.  The patient is currently working and reports feeling good about being back at work after a brief period of absence. They have a long-standing relationship with their clients and coworkers, which they find fulfilling.      ROS:  Review of Systems - Negative except getting over the late fall COVID-does not necessarily be cold weather but otherwise doing well  Patient reports feeling unwell since having COVID-19 in December. No specific cardiac symptoms. -Continue to monitor symptoms and recovery.    Objective   Studies Reviewed: SABRA   EKG Interpretation Date/Time:  Wednesday April 17 2023 14:41:08 EST Ventricular Rate:  75 PR Interval:  186 QRS Duration:  76 QT Interval:  368 QTC Calculation: 410 R Axis:   18  Text Interpretation: Normal sinus rhythm Normal ECG When compared with ECG of 31-May-2015 11:34, No significant change since last tracing Confirmed by Clay Alm ([id number]) on 04/25/2023 1:03:51 AM    Coronary Calcium  Score July 2021: Agatston score 0 Lab Results  Component Value Date   CHOL 182 10/18/2022   HDL 88 10/18/2022   LDLCALC 80 10/18/2022   LDLDIRECT 124 08/09/2011   TRIG 68 10/18/2022   CHOLHDL 2.1 10/18/2022   Lab Results  Component Value Date  NA 139 10/18/2022   CL 104 10/18/2022   K 4.3 10/18/2022   CO2 29 10/18/2022   BUN 15 10/18/2022   CREATININE 0.60 10/18/2022   GFRNONAA 97 01/09/2017   CALCIUM  9.3 10/18/2022   ALBUMIN 4.2 05/31/2015   GLUCOSE 78 10/18/2022    Risk Assessment/Calculations:     HYPERTENSION CONTROL Vitals:   04/17/23 1437 04/25/23 0108 04/25/23 0109  BP:  (!) 148/80 (!) 142/78 (!) 142/78    The patient's blood pressure is elevated above target today.  In order to address the patient's elevated BP: Blood pressure will be monitored at home to determine if medication changes need to be made.; Follow up with primary care provider for management. (Consider HCTZ if pressures continue to be increased.)          Physical Exam:   VS:  BP (!) 142/78   Pulse 75   Ht 5' 5 (1.651 m)   Wt 158 lb (71.7 kg)   LMP 12/15/2011   SpO2 95%   BMI 26.29 kg/m    Wt Readings from Last 3 Encounters:  04/17/23 158 lb (71.7 kg)  10/26/22 153 lb 6 oz (69.6 kg)  05/01/22 156 lb (70.8 kg)    GEN: Well nourished, well developed in no acute distress; healthy-appearing.  Well-groomed NECK: No JVD; No carotid bruits CARDIAC: Normal S1, S2; RRR, no murmurs, rubs, gallops RESPIRATORY:  Clear to auscultation without rales, wheezing or rhonchi ; nonlabored, good air movement. ABDOMEN: Soft, non-tender, non-distended EXTREMITIES:  No edema; No deformity      ASSESSMENT AND PLAN: .    Problem List Items Addressed This Visit       Cardiology Problems   Essential hypertension - Primary (Chronic)   She reports variable readings at home, mostly below 140/90. No symptoms of headache, blurred vision, or dizziness.  Currently on Amlodipine . -Monitor blood pressure at home a couple of times a week, especially prior to next visit with Dr. Rilla. -If readings consistently above 140/90, notify office for possible addition of HCTZ.      Relevant Orders   EKG 12-Lead (Completed)   Hyperlipidemia with target LDL less than 100 (Chronic)   Well controlled on Pravastatin  and CoQ10. Last cholesterol check in July showed stable LDL of 80  -Continue current regimen.      Microvascular coronary disease (Chronic)   With her having some mild hypertension in the past with some occasional chest pains, was started on amlodipine  for possible microvascular disease.  Coronary  Calcium  Score 0 indicating low likelihood of having macrovascular disease, and she is not having symptoms now. -Continue amlodipine .        Other   Family history of early CAD (Chronic)   Coronary Calcium  Score 0. -Continue to monitor risk factors with BP and HLD.       Heart palpitations (Chronic)   Random episodes, none severe enough to warrant use of as-needed Propranolol . No associated symptoms of chest pain or shortness of breath. -Continue Propranolol  as needed for severe episodes.        Follow-Up: Return in about 1 year (around 04/16/2024) for 1 Yr Follow-up, Routine follow up with me.     Signed, Alm MICAEL Clay, MD, MS Alm Clay, M.D., M.S. Interventional Cardiologist  [facility]  Pager # [phone] Phone # [phone] [address] [place], [place] [address]

## 2023-04-17 NOTE — Patient Instructions (Signed)
 Medication Instructions:  No changes  *If you need a refill on your cardiac medications before your next appointment, please call your pharmacy*   Lab Work: Not needed    Testing/Procedures:  Not needed  Follow-Up: At [facility], you and your health needs are our priority.  As part of our continuing mission to provide you with exceptional heart care, we have created designated Provider Care Teams.  These Care Teams include your primary Cardiologist (physician) and Advanced Practice Providers (APPs -  Physician Assistants and Nurse Practitioners) who all work together to provide you with the care you need, when you need it.     Your next appointment:   12 month(s)  The format for your next appointment:   In Person  Provider:   Alm Clay, MD    Other Instructions  Monitor your blood pressure

## 2023-04-25 ENCOUNTER — Encounter: Payer: Self-pay | Admitting: Cardiology

## 2023-04-25 NOTE — Assessment & Plan Note (Signed)
Well controlled on Pravastatin and CoQ10. Last cholesterol check in July showed stable LDL of 80  -Continue current regimen.

## 2023-04-25 NOTE — Assessment & Plan Note (Signed)
Coronary Calcium Score 0. -Continue to monitor risk factors with BP and HLD.

## 2023-04-25 NOTE — Assessment & Plan Note (Signed)
Random episodes, none severe enough to warrant use of as-needed Propranolol. No associated symptoms of chest pain or shortness of breath. -Continue Propranolol as needed for severe episodes.

## 2023-04-25 NOTE — Assessment & Plan Note (Signed)
She reports variable readings at home, mostly below 140/90. No symptoms of headache, blurred vision, or dizziness.  Currently on Amlodipine. -Monitor blood pressure at home a couple of times a week, especially prior to next visit with Dr. Sharen Hones. -If readings consistently above 140/90, notify office for possible addition of HCTZ.

## 2023-04-25 NOTE — Assessment & Plan Note (Signed)
With her having some mild hypertension in the past with some occasional chest pains, was started on amlodipine for possible microvascular disease.  Coronary Calcium Score 0 indicating low likelihood of having macrovascular disease, and she is not having symptoms now. -Continue amlodipine.

## 2023-05-12 ENCOUNTER — Other Ambulatory Visit: Payer: Self-pay | Admitting: Cardiology

## 2023-09-09 DIAGNOSIS — H903 Sensorineural hearing loss, bilateral: Secondary | ICD-10-CM | POA: Diagnosis not present

## 2023-09-09 DIAGNOSIS — H9313 Tinnitus, bilateral: Secondary | ICD-10-CM | POA: Diagnosis not present

## 2023-10-19 ENCOUNTER — Other Ambulatory Visit: Payer: Self-pay | Admitting: Family Medicine

## 2023-10-19 DIAGNOSIS — E559 Vitamin D deficiency, unspecified: Secondary | ICD-10-CM

## 2023-10-19 DIAGNOSIS — E785 Hyperlipidemia, unspecified: Secondary | ICD-10-CM

## 2023-10-19 DIAGNOSIS — I10 Essential (primary) hypertension: Secondary | ICD-10-CM

## 2023-10-21 ENCOUNTER — Other Ambulatory Visit (INDEPENDENT_AMBULATORY_CARE_PROVIDER_SITE_OTHER): Payer: 59

## 2023-10-21 DIAGNOSIS — I10 Essential (primary) hypertension: Secondary | ICD-10-CM

## 2023-10-21 DIAGNOSIS — E785 Hyperlipidemia, unspecified: Secondary | ICD-10-CM | POA: Diagnosis not present

## 2023-10-21 DIAGNOSIS — E559 Vitamin D deficiency, unspecified: Secondary | ICD-10-CM | POA: Diagnosis not present

## 2023-10-21 LAB — COMPREHENSIVE METABOLIC PANEL WITH GFR
ALT: 13 U/L (ref 0–35)
AST: 17 U/L (ref 0–37)
Albumin: 4.3 g/dL (ref 3.5–5.2)
Alkaline Phosphatase: 50 U/L (ref 39–117)
BUN: 11 mg/dL (ref 6–23)
CO2: 27 meq/L (ref 19–32)
Calcium: 8.6 mg/dL (ref 8.4–10.5)
Chloride: 100 meq/L (ref 96–112)
Creatinine, Ser: 0.63 mg/dL (ref 0.40–1.20)
GFR: 92.09 mL/min (ref >60.00)
Glucose, Bld: 79 mg/dL (ref 70–99)
Potassium: 3.8 meq/L (ref 3.5–5.1)
Sodium: 135 meq/L (ref 135–145)
Total Bilirubin: 0.4 mg/dL (ref 0.2–1.2)
Total Protein: 6.2 g/dL (ref 6.0–8.3)

## 2023-10-21 LAB — MICROALBUMIN / CREATININE URINE RATIO
Creatinine,U: 93.1 mg/dL
Microalb Creat Ratio: 9.3 mg/g (ref 0.0–30.0)
Microalb, Ur: 0.9 mg/dL (ref 0.0–1.9)

## 2023-10-21 LAB — LIPID PANEL
Cholesterol: 168 mg/dL (ref 0–200)
HDL: 75.3 mg/dL (ref >39.00)
LDL Cholesterol: 80 mg/dL (ref 0–99)
NonHDL: 92.81
Total CHOL/HDL Ratio: 2
Triglycerides: 64 mg/dL (ref 0.0–149.0)
VLDL: 12.8 mg/dL (ref 0.0–40.0)

## 2023-10-21 LAB — VITAMIN D 25 HYDROXY (VIT D DEFICIENCY, FRACTURES): VITD: 60.86 ng/mL (ref 30.00–100.00)

## 2023-10-22 ENCOUNTER — Ambulatory Visit: Payer: Self-pay | Admitting: Family Medicine

## 2023-10-28 ENCOUNTER — Encounter: Payer: Self-pay | Admitting: Family Medicine

## 2023-10-28 ENCOUNTER — Ambulatory Visit: Payer: 59 | Admitting: Family Medicine

## 2023-10-28 VITALS — BP 142/82 | HR 69 | Temp 98.2°F | Ht 65.0 in | Wt 157.6 lb

## 2023-10-28 DIAGNOSIS — I10 Essential (primary) hypertension: Secondary | ICD-10-CM | POA: Diagnosis not present

## 2023-10-28 DIAGNOSIS — Z0000 Encounter for general adult medical examination without abnormal findings: Secondary | ICD-10-CM | POA: Diagnosis not present

## 2023-10-28 DIAGNOSIS — E559 Vitamin D deficiency, unspecified: Secondary | ICD-10-CM | POA: Diagnosis not present

## 2023-10-28 DIAGNOSIS — E785 Hyperlipidemia, unspecified: Secondary | ICD-10-CM | POA: Diagnosis not present

## 2023-10-28 DIAGNOSIS — F5104 Psychophysiologic insomnia: Secondary | ICD-10-CM | POA: Diagnosis not present

## 2023-10-28 MED ORDER — AMLODIPINE BESYLATE 5 MG PO TABS
5.0000 mg | ORAL_TABLET | Freq: Every day | ORAL | 4 refills | Status: AC
Start: 2023-10-28 — End: ?

## 2023-10-28 NOTE — Assessment & Plan Note (Signed)
 Chronic, great control on pravastatin  - continue. The 10-year ASCVD risk score (Arnett DK, et al., 2019) is: 8.3%   Values used to calculate the score:     Age: 68 years     Clincally relevant sex: Female     Is Non-Hispanic African American: No     Diabetic: No     Tobacco smoker: No     Systolic Blood Pressure: 142 mmHg     Is BP treated: Yes     HDL Cholesterol: 75.3 mg/dL     Total Cholesterol: 168 mg/dL

## 2023-10-28 NOTE — Assessment & Plan Note (Addendum)
 Levels stable on Vit D3 OTC 5000 units daily

## 2023-10-28 NOTE — Assessment & Plan Note (Signed)
 Preventative protocols reviewed and updated unless pt declined. Discussed healthy diet and lifestyle.

## 2023-10-28 NOTE — Progress Notes (Addendum)
 Ph: [phone] Fax: [phone]   Patient ID: [name], female    DOB: 12-Aug-1955, 68 y.o.   MRN: [id number]  This visit was conducted in person.  BP (!) 142/82   Pulse 69   Temp 98.2 F (36.8 C) (Oral)   Ht 5' 5 (1.651 m)   Wt 157 lb 9.6 oz (71.5 kg)   LMP 12/15/2011   SpO2 99%   BMI 26.23 kg/m   Home cuff comparable to office cuff today  136/77 at home on 10/27/2023  CC: CPE Subjective:   HPI: [name] is a 68 y.o. female presenting on 10/28/2023 for Annual Exam (Pt brought home BP monitor to compare. Reading in office today- 141/83.)   Did not see health advisor.   No results found.  Flowsheet Row Office Visit from 10/28/2023 in [facility] [place]  PHQ-2 Total Score 0       10/28/2023    2:48 PM 10/26/2022    2:55 PM 10/11/2020    2:40 PM  Fall Risk   Falls in the past year? 0 0 0   Currently doesn't have medicare.   Microvascular ischemia CAD followed by Dr Anner. Strong fmhx CAD. On pravastatin  40mg  daily, reassuring coronary calcium  score of zero (10/2019).   HTN - home BP readings overall well controlled at home on amlodipine  5mg , propranolol  10mg  at bed120-130s/70-80stime prn.   Sees [facility]  for ongoing tinnitus - found to have high frequency hearing loss bilaterally L>R. Not needing hearing aides at this time - sees ENT yearly.   On NP thyroid , estrogen and testosterone hormone pellets and oral progesterone  through [facility] in [place].   Mother diagnosed age 61yo with pulmonary aneurysm.  No other fmhx aneurysm.   Preventative: COLONOSCOPY Date: 2008 small hyperplastic polyps, small int hem, early diverticula ([place]) rpt 10 yrs. iFOB through work normal 06/2021, 09/2022, 09/2023.  Mammogram 01/2023 @ [facility] - abnormal s/o benign breast biopsy - fibroadenoma - rec return to yearly screening mammo Well woman with OBGYN Dr. Bobie Cary - last seen 04/2022 with pap done. Ongoing hot  flashes, started HRT 09/2021 (E, P, T) through [facility] in [place]. LMP ~2014  Lung cancer screening - not eligible Flu shot yearly COVID vaccine - Pfizer 04/2019, 05/2019, booster 01/2020 , bivalent 12/2020 Prevnar-20 - discussed, declined. To consider  Tdap - 2012, 10/2021 Shingrix - 01/2018, 05/2018  RSV - Fall 2023 Advanced directive - needs this updated. Thinks daughter would be HCPOA. Asked to bring us  copy when complete.  Seat belt us  discussed. Sunscreen use discussed. H/o melanoma, followed regularly by derm yearly (Dr Amy Swaz[place]and at [facility] derm)  Sleep - averaging 7-8 hours/night  Non smoker Alcohol - enjoys seltzers  Dentist q6 mo Eye exam yearly Bowel - no constipation Bladder - no incontinence    Caffeine: 2 cups coffee/day   Widower Adriana 06/2018) 2 dogs.   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 Interim medical history since our last visit reviewed. Allergies and medications reviewed and updated. Outpatient Medications Prior to Visit  Medication Sig Dispense Refill   ALPRAZolam  (XANAX ) 0.5 MG tablet Take 0.5 tablets (0.25 mg total) by mouth at bedtime as needed for sleep. 20 tablet  0   Cholecalciferol (VITAMIN D3 PO) Take 1 capsule by mouth daily. Takes 5,000 IU daily     Coenzyme Q10 ([employer] COQ-10) 200 MG capsule Take 1 capsule (200 mg total) by mouth daily.     Cyanocobalamin (VITAMIN B-12) 5000 MCG SUBL Place 1 tablet under the tongue daily.     diphenhydrAMINE  (BENADRYL  ALLERGY) 25 MG tablet Take 0.5 tablets (12.5 mg total) by mouth at bedtime as needed for sleep.     Melatonin 3 MG TABS Take 1 tablet by mouth at bedtime. Spray formulation     MISC NATURAL PRODUCTS IJ Inject as directed. BHRT pellets every 3 months     NP THYROID  60 MG  tablet Take 60 mg by mouth every morning.     pravastatin  (PRAVACHOL ) 40 MG tablet Take 1 tablet (40 mg total) by mouth daily. 90 tablet 3   Probiotic Product (PROBIOTIC PO) Take by mouth daily.     progesterone  (PROMETRIUM ) 200 MG capsule Take 1 capsule (200 mg total) by mouth daily.     propranolol  (INDERAL ) 10 MG tablet TAKE 1 TABLET BY MOUTH AT BEDTIME OR AS NEEDED 90 tablet 3   Turmeric (QC TUMERIC COMPLEX PO) Take 1,000 mg by mouth.     vitamin C (ASCORBIC ACID) 500 MG tablet Take 500 mg by mouth daily. Takes 2 tablets daily     Zinc  50 MG CAPS Take 1 capsule (50 mg total) by mouth daily.  0   amLODipine  (NORVASC ) 5 MG tablet Take 1 tablet (5 mg total) by mouth daily. 90 tablet 4   lidocaine  (XYLOCAINE ) 2 % solution Use as directed 15 mLs in the mouth or throat as needed for mouth pain. 100 mL 0   cycloSPORINE (RESTASIS) 0.05 % ophthalmic emulsion Place 1 drop into both eyes 2 (two) times daily. (Patient not taking: Reported on 10/28/2023)     No facility-administered medications prior to visit.     Per HPI unless specifically indicated in ROS section below Review of Systems  Constitutional:  Negative for activity change, appetite change, chills, fatigue, fever and unexpected weight change.  HENT:  Negative for hearing loss.   Eyes:  Negative for visual disturbance.  Respiratory:  Negative for cough, chest tightness, shortness of breath and wheezing.   Cardiovascular:  Negative for chest pain, palpitations and leg swelling.  Gastrointestinal:  Negative for abdominal distention, abdominal pain, blood in stool, constipation, diarrhea, nausea and vomiting.  Genitourinary:  Negative for difficulty urinating and hematuria.  Musculoskeletal:  Negative for arthralgias, myalgias and neck pain.  Skin:  Negative for rash.  Neurological:  Negative for dizziness, seizures, syncope and headaches.  Hematological:  Negative for adenopathy. Bruises/bleeds easily.  Psychiatric/Behavioral:  Negative  for dysphoric mood. The patient is not nervous/anxious.     Objective:  BP (!) 142/82   Pulse 69   Temp 98.2 F (36.8 C) (Oral)   Ht 5' 5 (1.651 m)   Wt 157 lb 9.6 oz (71.5 kg)   LMP 12/15/2011   SpO2 99%   BMI 26.23 kg/m   Wt Readings from Last 3 Encounters:  10/28/23 157 lb 9.6 oz (71.5 kg)  04/17/23 158 lb (71.7 kg)  10/26/22 153 lb 6 oz (69.6 kg)      Physical Exam Vitals and nursing note reviewed.  Constitutional:      Appearance: Normal appearance. She is not ill-appearing.  HENT:     Head: Normocephalic and atraumatic.     Right Ear: Tympanic membrane, ear canal  and external ear normal. There is no impacted cerumen.     Left Ear: Tympanic membrane, ear canal and external ear normal. There is no impacted cerumen.     Mouth/Throat:     Mouth: Mucous membranes are moist.     Pharynx: Oropharynx is clear. No oropharyngeal exudate or posterior oropharyngeal erythema.  Eyes:     General:        Right eye: No discharge.        Left eye: No discharge.     Extraocular Movements: Extraocular movements intact.     Conjunctiva/sclera: Conjunctivae normal.     Pupils: Pupils are equal, round, and reactive to light.  Neck:     Thyroid : No thyroid  mass or thyromegaly.     Vascular: No carotid bruit.  Cardiovascular:     Rate and Rhythm: Normal rate and regular rhythm.     Pulses: Normal pulses.     Heart sounds: Normal heart sounds. No murmur heard. Pulmonary:     Effort: Pulmonary effort is normal. No respiratory distress.     Breath sounds: Normal breath sounds. No wheezing, rhonchi or rales.  Abdominal:     General: Bowel sounds are normal. There is no distension.     Palpations: Abdomen is soft. There is no mass.     Tenderness: There is no abdominal tenderness. There is no guarding or rebound.     Hernia: No hernia is present.  Musculoskeletal:     Cervical back: Normal range of motion and neck supple. No rigidity.     Right lower leg: No edema.     Left lower  leg: No edema.  Lymphadenopathy:     Cervical: No cervical adenopathy.  Skin:    General: Skin is warm and dry.     Findings: No rash.  Neurological:     General: No focal deficit present.     Mental Status: She is alert. Mental status is at baseline.  Psychiatric:        Mood and Affect: Mood normal.        Behavior: Behavior normal.       Results for orders placed or performed in visit on 10/21/23  VITAMIN D  25 Hydroxy (Vit-D Deficiency, Fractures)   Collection Time: 10/21/23  8:35 AM  Result Value Ref Range   VITD 60.86 30.00 - 100.00 ng/mL  Microalbumin / creatinine urine ratio   Collection Time: 10/21/23  8:35 AM  Result Value Ref Range   Microalb, Ur 0.9 0.0 - 1.9 mg/dL   Creatinine,U 06.8 mg/dL   Microalb Creat Ratio 9.3 0.0 - 30.0 mg/g  Comprehensive metabolic panel with GFR   Collection Time: 10/21/23  8:35 AM  Result Value Ref Range   Sodium 135 135 - 145 mEq/L   Potassium 3.8 3.5 - 5.1 mEq/L   Chloride 100 96 - 112 mEq/L   CO2 27 19 - 32 mEq/L   Glucose, Bld 79 70 - 99 mg/dL   BUN 11 6 - 23 mg/dL   Creatinine, Ser 9.36 0.40 - 1.20 mg/dL   Total Bilirubin 0.4 0.2 - 1.2 mg/dL   Alkaline Phosphatase 50 39 - 117 U/L   AST 17 0 - 37 U/L   ALT 13 0 - 35 U/L   Total Protein 6.2 6.0 - 8.3 g/dL   Albumin 4.3 3.5 - 5.2 g/dL   GFR 07.90 >39.99 mL/min   Calcium  8.6 8.4 - 10.5 mg/dL  Lipid panel   Collection Time: 10/21/23  8:35  AM  Result Value Ref Range   Cholesterol 168 0 - 200 mg/dL   Triglycerides 35.9 0.0 - 149.0 mg/dL   HDL 24.69 >60.99 mg/dL   VLDL 87.1 0.0 - 59.9 mg/dL   LDL Cholesterol 80 0 - 99 mg/dL   Total CHOL/HDL Ratio 2    NonHDL 92.81     Assessment & Plan:   Problem List Items Addressed This Visit     Hyperlipidemia with target LDL less than 100 (Chronic)   Chronic, great control on pravastatin  - continue. The 10-year ASCVD risk score (Arnett DK, et al., 2019) is: 8.3%   Values used to calculate the score:     Age: 47 years      Clincally relevant sex: Female     Is Non-Hispanic African American: No     Diabetic: No     Tobacco smoker: No     Systolic Blood Pressure: 142 mmHg     Is BP treated: Yes     HDL Cholesterol: 75.3 mg/dL     Total Cholesterol: 168 mg/dL       Relevant Medications   amLODipine  (NORVASC ) 5 MG tablet   Health maintenance examination - Primary (Chronic)   Preventative protocols reviewed and updated unless pt declined. Discussed healthy diet and lifestyle.       Essential hypertension (Chronic)   Chronic, overall stable home readings with in-office readings significantly higher. Anticipate component of white coat hypertension. Will titrate meds based on home readings.      Relevant Medications   amLODipine  (NORVASC ) 5 MG tablet   Chronic insomnia   Predominant sleep maintenance insomnia.  Discussed trial L-theanine.  Continue PRN xanax  (rare use), benadryl .       Vitamin D  deficiency   Levels stable on Vit D3 OTC 5000 units daily        Meds ordered this encounter  Medications   amLODipine  (NORVASC ) 5 MG tablet    Sig: Take 1 tablet (5 mg total) by mouth daily.    Dispense:  90 tablet    Refill:  4    No orders of the defined types were placed in this encounter.   Patient Instructions  Consider L-theanine supplement for sleep.  Good to see you today - return as needed or in 1 year for next physical   Follow up plan: Return in about 1 year (around 10/27/2024) for annual exam, prior fasting for blood work.  Anton Blas, MD

## 2023-10-28 NOTE — Patient Instructions (Addendum)
 Consider L-theanine supplement for sleep.  Good to see you today - return as needed or in 1 year for next physical

## 2023-10-28 NOTE — Assessment & Plan Note (Signed)
 Predominant sleep maintenance insomnia.  Discussed trial L-theanine.  Continue PRN xanax  (rare use), benadryl .

## 2023-10-28 NOTE — Assessment & Plan Note (Signed)
 Chronic, overall stable home readings with in-office readings significantly higher. Anticipate component of white coat hypertension. Will titrate meds based on home readings.

## 2023-12-17 ENCOUNTER — Ambulatory Visit: Payer: Self-pay | Admitting: *Deleted

## 2023-12-17 NOTE — Telephone Encounter (Signed)
 FYI Only or Action Required?: FYI only for provider.  Patient was last seen in primary care on 10/28/2023 by Rilla Baller, MD.  Called Nurse Triage reporting Hip Pain.  Symptoms began several days ago.  Interventions attempted: OTC medications: tylenol  and ibuprofen  with some relief.  Symptoms are: gradually worsening.  Triage Disposition: See PCP When Office is Open (Within 3 Days)  Patient/caregiver understands and will follow disposition?: Yes               Copied from CRM [id number]. Topic: Clinical - Red Word Triage >> Dec 17, 2023 11:16 AM Vena HERO wrote: Red Word that prompted transfer to Nurse Triage: severe pain Reason for Disposition  [1] MODERATE pain (e.g., interferes with normal activities, limping) AND [2] present > 3 days  Answer Assessment - Initial Assessment Questions No available appt with PCP. Scheduled appt with other provider tomorrow. Recommended if sx worsen go to UC/ED. Patient denies calf pain swelling no report of chest pain or difficulty breathing.          1. LOCATION and RADIATION: Where is the pain located? Does the pain spread (shoot) anywhere else?     Bilateral hips to knees and back of calves  2. QUALITY: What does the pain feel like?  (e.g., sharp, dull, aching, burning)     Ache constant  3. SEVERITY: How bad is the pain? What does it keep you from doing?   (Scale 1-10; or mild, moderate, severe)     7/10 4. ONSET: When did the pain start? Does it come and go, or is it there all the time?     Worsening x 1 week  5. WORK OR EXERCISE: Has there been any recent work or exercise that involved this part of the body?      no 6. CAUSE: What do you think is causing the hip pain?      Not sure  7. AGGRAVATING FACTORS: What makes the hip pain worse? (e.g., walking, climbing stairs, running)     No  8. OTHER SYMPTOMS: Do you have any other symptoms? (e.g., back pain, pain shooting down leg,  fever, rash)      Trouble sleeping , pain shooting outside of legs to knees and back of calves. No swelling no redness to calves. Can walk can do regular activities.  Protocols used: Hip Pain-A-AH

## 2023-12-17 NOTE — Telephone Encounter (Signed)
 Appreciate [name] seeing this pleasant pt tomorrow.

## 2023-12-18 ENCOUNTER — Ambulatory Visit: Admitting: Primary Care

## 2023-12-31 ENCOUNTER — Encounter: Payer: Self-pay | Admitting: Family Medicine

## 2023-12-31 DIAGNOSIS — D709 Neutropenia, unspecified: Secondary | ICD-10-CM

## 2024-01-06 DIAGNOSIS — D709 Neutropenia, unspecified: Secondary | ICD-10-CM | POA: Insufficient documentation

## 2024-02-11 LAB — HM MAMMOGRAPHY

## 2024-02-13 ENCOUNTER — Encounter: Payer: Self-pay | Admitting: Family Medicine

## 2024-02-15 ENCOUNTER — Ambulatory Visit: Payer: Self-pay | Admitting: Family Medicine

## 2024-03-09 DIAGNOSIS — D239 Other benign neoplasm of skin, unspecified: Secondary | ICD-10-CM

## 2024-03-09 HISTORY — DX: Other benign neoplasm of skin, unspecified: D23.9

## 2024-03-13 ENCOUNTER — Encounter: Payer: Self-pay | Admitting: Family Medicine

## 2024-03-27 ENCOUNTER — Encounter: Payer: Self-pay | Admitting: Cardiology

## 2024-05-05 ENCOUNTER — Other Ambulatory Visit: Payer: Self-pay | Admitting: Cardiology

## 2024-07-02 ENCOUNTER — Ambulatory Visit: Admitting: Obstetrics and Gynecology

## 2024-07-15 ENCOUNTER — Ambulatory Visit: Admitting: Cardiology

## 2024-10-21 ENCOUNTER — Other Ambulatory Visit

## 2024-10-28 ENCOUNTER — Encounter: Admitting: Family Medicine
